# Patient Record
Sex: Female | Born: 1985 | Race: White | Hispanic: No | Marital: Married | Smoking: Never smoker
Health system: Southern US, Community
[De-identification: ages and names within clinical notes are randomized; demographics above are authoritative.]

## PROBLEM LIST (undated history)

## (undated) ENCOUNTER — Inpatient Hospital Stay (HOSPITAL_COMMUNITY): Payer: Self-pay

## (undated) DIAGNOSIS — K831 Obstruction of bile duct: Secondary | ICD-10-CM

## (undated) DIAGNOSIS — F329 Major depressive disorder, single episode, unspecified: Secondary | ICD-10-CM

## (undated) DIAGNOSIS — Z98891 History of uterine scar from previous surgery: Secondary | ICD-10-CM

## (undated) DIAGNOSIS — I519 Heart disease, unspecified: Secondary | ICD-10-CM

## (undated) DIAGNOSIS — K760 Fatty (change of) liver, not elsewhere classified: Secondary | ICD-10-CM

## (undated) DIAGNOSIS — F32A Depression, unspecified: Secondary | ICD-10-CM

## (undated) DIAGNOSIS — E669 Obesity, unspecified: Secondary | ICD-10-CM

## (undated) DIAGNOSIS — F419 Anxiety disorder, unspecified: Secondary | ICD-10-CM

## (undated) DIAGNOSIS — Z8759 Personal history of other complications of pregnancy, childbirth and the puerperium: Secondary | ICD-10-CM

## (undated) DIAGNOSIS — O26619 Liver and biliary tract disorders in pregnancy, unspecified trimester: Secondary | ICD-10-CM

## (undated) DIAGNOSIS — O26649 Intrahepatic cholestasis of pregnancy, unspecified trimester: Secondary | ICD-10-CM

## (undated) DIAGNOSIS — I10 Essential (primary) hypertension: Secondary | ICD-10-CM

## (undated) DIAGNOSIS — E559 Vitamin D deficiency, unspecified: Secondary | ICD-10-CM

## (undated) DIAGNOSIS — Z8619 Personal history of other infectious and parasitic diseases: Secondary | ICD-10-CM

## (undated) DIAGNOSIS — K7581 Nonalcoholic steatohepatitis (NASH): Secondary | ICD-10-CM

## (undated) HISTORY — DX: Depression, unspecified: F32.A

## (undated) HISTORY — PX: WISDOM TOOTH EXTRACTION: SHX21

## (undated) HISTORY — DX: Liver and biliary tract disorders in pregnancy, unspecified trimester: O26.619

## (undated) HISTORY — DX: Anxiety disorder, unspecified: F41.9

## (undated) HISTORY — DX: Intrahepatic cholestasis of pregnancy, unspecified trimester: O26.649

## (undated) HISTORY — DX: Major depressive disorder, single episode, unspecified: F32.9

## (undated) HISTORY — DX: Obstruction of bile duct: K83.1

## (undated) HISTORY — DX: Obesity, unspecified: E66.9

## (undated) HISTORY — DX: Essential (primary) hypertension: I10

## (undated) HISTORY — DX: Vitamin D deficiency, unspecified: E55.9

## (undated) HISTORY — DX: Heart disease, unspecified: I51.9

## (undated) HISTORY — DX: Fatty (change of) liver, not elsewhere classified: K76.0

## (undated) HISTORY — PX: APPENDECTOMY: SHX54

## (undated) HISTORY — DX: Personal history of other infectious and parasitic diseases: Z86.19

---
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
## 2014-04-15 ENCOUNTER — Encounter: Payer: Self-pay | Admitting: Nurse Practitioner

## 2014-04-25 ENCOUNTER — Ambulatory Visit (INDEPENDENT_AMBULATORY_CARE_PROVIDER_SITE_OTHER): Payer: BC Managed Care – PPO | Admitting: Nurse Practitioner

## 2014-04-25 ENCOUNTER — Encounter: Payer: Self-pay | Admitting: Nurse Practitioner

## 2014-04-25 VITALS — BP 124/82 | HR 88 | Temp 98.3°F | Resp 18 | Ht 64.5 in | Wt 312.0 lb

## 2014-04-25 DIAGNOSIS — Z6841 Body Mass Index (BMI) 40.0 and over, adult: Secondary | ICD-10-CM | POA: Insufficient documentation

## 2014-04-25 DIAGNOSIS — Z87898 Personal history of other specified conditions: Secondary | ICD-10-CM | POA: Insufficient documentation

## 2014-04-25 DIAGNOSIS — D229 Melanocytic nevi, unspecified: Secondary | ICD-10-CM | POA: Insufficient documentation

## 2014-04-25 DIAGNOSIS — Z0000 Encounter for general adult medical examination without abnormal findings: Secondary | ICD-10-CM | POA: Insufficient documentation

## 2014-04-25 HISTORY — DX: Personal history of other specified conditions: Z87.898

## 2014-04-25 NOTE — Progress Notes (Signed)
Subjective:     Patricia Berry is a 28 y.o. female and is here to establish care. Historical care was with PCP in [place], [place]. The patient reports problems - struggle with weight & peripheral edema in bilat LE w/ prolonged sitting & walking.  Regarding weight, pt states she is only member of her family with weight problem. Husband is nml wt. She has had thyroid studies in past, always nml. She has been to nutritionist, doesn't feel she has horrible diet. Drinks mostly water & bottled green tea. Cooks at home most nights. Not exercising. She is tearful as she discusses struggles with weight. Regarding peripheral edema, she describes 2 episodes that occurred while traveling on separate occasions that she had to buy bigger shoes because feet so swollen. She was walking a lot. She mentions hands & feet are frequently cold. History   Social History  . Marital Status: Married    Spouse Name: N/A    Number of Children: N/A  . Years of Education: N/A   Occupational History  . Not on file.   Social History Main Topics  . Smoking status: Never Smoker   . Smokeless tobacco: Not on file  . Alcohol Use: Not on file  . Drug Use: Not on file  . Sexual Activity: Not on file   Other Topics Concern  . Not on file   Social History Narrative  . No narrative on file   Health Maintenance  Topic Date Due  . PAP SMEAR  04/01/2005  . TETANUS/TDAP  04/01/2006  . INFLUENZA VACCINE  01/18/2014    The following portions of the patient's history were reviewed and updated as appropriate: allergies, current medications, past family history, past medical history, past social history, past surgical history and problem list.  Review of Systems Pertinent items are noted in HPI.   Objective:    BP 124/82 mmHg  Pulse 88  Temp(Src) 98.3 F (36.8 C) (Oral)  Resp 18  Ht 5' 4.5" (1.638 m)  Wt 312 lb (141.522 kg)  BMI 52.75 kg/m2  SpO2 96%  LMP 04/10/2014 General appearance: alert, cooperative, appears  stated age and no distress Head: Normocephalic, without obvious abnormality, atraumatic Eyes: negative findings: lids and lashes normal, conjunctivae and sclerae normal, corneas clear and pupils equal, round, reactive to light and accomodation Ears: normal TM's and external ear canals both ears Throat: lips, mucosa, and tongue normal; teeth and gums normal Lungs: clear to auscultation bilaterally Heart: regular rate and rhythm, S1, S2 normal, no murmur, click, rub or gallop Abdomen: normal findings: no masses palpable, no organomegaly and spleen non-palpable and abnormal findings:  RUQ tender to deep palpation Extremities: extremities normal, atraumatic, no cyanosis or edema Pulses: 2+ and symmetric Skin: Skin color, texture, turgor normal. No rashes or lesions or nevi - back, brown, multi-colored, round, appears to have light brown halo around it. about 4 mm Neurologic: Alert and oriented X 3, normal strength and tone. Normal symmetric reflexes. Normal coordination and gait    Assessment & Plan:  1. BMI 50.0-59.9, adult 81 Ho for best nutrition & weight loss F/u 6 weeks  2. Nevus R upper back Watch & wait  3. History of peripheral edema Associated w/prolonged walking & sitting Likely r/t weight, pendulous abdomen  4. Preventative health care Declines Tdap & flu today Last pap 1 ya, nml, no abnmls.

## 2014-04-25 NOTE — Patient Instructions (Addendum)
Please refer to handout & follow principles for best nutrition & weight loss.  Develop lifelong habits of exercise most days of the week: take a 30 minute walk. The benefits include weight loss, lower risk for heart disease, diabetes, stroke, high blood pressure, lower rates of depression & dementia, better sleep quality & bone health.  Return for fasting labs-water, tea & coffee without sugar or milk is OK.  My office will call with lab results and any necessary follow up.  I will see you in 6 weeks.  Nice to meet you!  Preventive Care for Adults, Female A healthy lifestyle and preventive care can promote health and wellness. Preventive health guidelines for women include the following key practices.  A routine yearly physical is a good way to check with your caregiver about your health and preventive screening. It is a chance to share any concerns and updates on your health, and to receive a thorough exam.  Visit your dentist for a routine exam and preventive care every 6 months. Brush your teeth twice a day and floss once a day. Good oral hygiene prevents tooth decay and gum disease.  The frequency of eye exams is based on your age, health, family medical history, use of contact lenses, and other factors. Follow your caregiver's recommendations for frequency of eye exams.  Eat a healthy diet. Foods like vegetables, fruits, whole grains, low-fat dairy products, and lean protein foods contain the nutrients you need without too many calories. Decrease your intake of foods high in solid fats, added sugars, and salt. Eat the right amount of calories for you.Get information about a proper diet from your caregiver, if necessary.  Regular physical exercise is one of the most important things you can do for your health. Most adults should get at least 150 minutes of moderate-intensity exercise (any activity that increases your heart rate and causes you to sweat) each week. In addition, most adults  need muscle-strengthening exercises on 2 or more days a week.  Maintain a healthy weight. The body mass index (BMI) is a screening tool to identify possible weight problems. It provides an estimate of body fat based on height and weight. Your caregiver can help determine your BMI, and can help you achieve or maintain a healthy weight.For adults 20 years and older:  A BMI below 18.5 is considered underweight.  A BMI of 18.5 to 24.9 is normal.  A BMI of 25 to 29.9 is considered overweight.  A BMI of 30 and above is considered obese.  Maintain normal blood lipids and cholesterol levels by exercising and minimizing your intake of saturated fat. Eat a balanced diet with plenty of fruit and vegetables. Blood tests for lipids and cholesterol should begin at age 33 and be repeated every 5 years. If your lipid or cholesterol levels are high, you are over 50, or you are at high risk for heart disease, you may need your cholesterol levels checked more frequently.Ongoing high lipid and cholesterol levels should be treated with medicines if diet and exercise are not effective.  If you smoke, find out from your caregiver how to quit. If you do not use tobacco, do not start.  Lung cancer screening is recommended for adults aged 61 80 years who are at high risk for developing lung cancer because of a history of smoking. Yearly low-dose computed tomography (CT) is recommended for people who have at least a 30-pack-year history of smoking and are a current smoker or have quit within the past  15 years. A pack year of smoking is smoking an average of 1 pack of cigarettes a day for 1 year (for example: 1 pack a day for 30 years or 2 packs a day for 15 years). Yearly screening should continue until the smoker has stopped smoking for at least 15 years. Yearly screening should also be stopped for people who develop a health problem that would prevent them from having lung cancer treatment.  If you are pregnant, do not  drink alcohol. If you are breastfeeding, be very cautious about drinking alcohol. If you are not pregnant and choose to drink alcohol, do not exceed 1 drink per day. One drink is considered to be 12 ounces (355 mL) of beer, 5 ounces (148 mL) of wine, or 1.5 ounces (44 mL) of liquor.  Avoid use of street drugs. Do not share needles with anyone. Ask for help if you need support or instructions about stopping the use of drugs.  High blood pressure causes heart disease and increases the risk of stroke. Your blood pressure should be checked at least every 1 to 2 years. Ongoing high blood pressure should be treated with medicines if weight loss and exercise are not effective.  If you are 55 to 27 years old, ask your caregiver if you should take aspirin to prevent strokes.  Diabetes screening involves taking a blood sample to check your fasting blood sugar level. This should be done once every 3 years, after age 45, if you are within normal weight and without risk factors for diabetes. Testing should be considered at a younger age or be carried out more frequently if you are overweight and have at least 1 risk factor for diabetes.  Breast cancer screening is essential preventive care for women. You should practice "breast self-awareness." This means understanding the normal appearance and feel of your breasts and may include breast self-examination. Any changes detected, no matter how small, should be reported to a caregiver. Women in their 20s and 30s should have a clinical breast exam (CBE) by a caregiver as part of a regular health exam every 1 to 3 years. After age 40, women should have a CBE every year. Starting at age 40, women should consider having a mammography (breast X-ray test) every year. Women who have a family history of breast cancer should talk to their caregiver about genetic screening. Women at a high risk of breast cancer should talk to their caregivers about having magnetic resonance imaging  (MRI) and a mammography every year.  Breast cancer gene (BRCA)-related cancer risk assessment is recommended for women who have family members with BRCA-related cancers. BRCA-related cancers include breast, ovarian, tubal, and peritoneal cancers. Having family members with these cancers may be associated with an increased risk for harmful changes (mutations) in the breast cancer genes BRCA1 and BRCA2. Results of the assessment will determine the need for genetic counseling and BRCA1 and BRCA2 testing.  The Pap test is a screening test for cervical cancer. A Pap test can show cell changes on the cervix that might become cervical cancer if left untreated. A Pap test is a procedure in which cells are obtained and examined from the lower end of the uterus (cervix).  Women should have a Pap test starting at age 21.  Between ages 21 and 29, Pap tests should be repeated every 2 years.  Beginning at age 30, you should have a Pap test every 3 years as long as the past 3 Pap tests have been normal.    Some women have medical problems that increase the chance of getting cervical cancer. Talk to your caregiver about these problems. It is especially important to talk to your caregiver if a new problem develops soon after your last Pap test. In these cases, your caregiver may recommend more frequent screening and Pap tests.  The above recommendations are the same for women who have or have not gotten the vaccine for human papillomavirus (HPV).  If you had a hysterectomy for a problem that was not cancer or a condition that could lead to cancer, then you no longer need Pap tests. Even if you no longer need a Pap test, a regular exam is a good idea to make sure no other problems are starting.  If you are between ages 65 and 70, and you have had normal Pap tests going back 10 years, you no longer need Pap tests. Even if you no longer need a Pap test, a regular exam is a good idea to make sure no other problems are  starting.  If you have had past treatment for cervical cancer or a condition that could lead to cancer, you need Pap tests and screening for cancer for at least 20 years after your treatment.  If Pap tests have been discontinued, risk factors (such as a new sexual partner) need to be reassessed to determine if screening should be resumed.  The HPV test is an additional test that may be used for cervical cancer screening. The HPV test looks for the virus that can cause the cell changes on the cervix. The cells collected during the Pap test can be tested for HPV. The HPV test could be used to screen women aged 30 years and older, and should be used in women of any age who have unclear Pap test results. After the age of 30, women should have HPV testing at the same frequency as a Pap test.  Colorectal cancer can be detected and often prevented. Most routine colorectal cancer screening begins at the age of 50 and continues through age 75. However, your caregiver may recommend screening at an earlier age if you have risk factors for colon cancer. On a yearly basis, your caregiver may provide home test kits to check for hidden blood in the stool. Use of a small camera at the end of a tube, to directly examine the colon (sigmoidoscopy or colonoscopy), can detect the earliest forms of colorectal cancer. Talk to your caregiver about this at age 50, when routine screening begins. Direct examination of the colon should be repeated every 5 to 10 years through age 75, unless early forms of pre-cancerous polyps or small growths are found.  Hepatitis C blood testing is recommended for all people born from 1945 through 1965 and any individual with known risks for hepatitis C.  Practice safe sex. Use condoms and avoid high-risk sexual practices to reduce the spread of sexually transmitted infections (STIs). STIs include gonorrhea, chlamydia, syphilis, trichomonas, herpes, HPV, and human immunodeficiency virus (HIV).  Herpes, HIV, and HPV are viral illnesses that have no cure. They can result in disability, cancer, and death. Sexually active women aged 25 and younger should be checked for chlamydia. Older women with new or multiple partners should also be tested for chlamydia. Testing for other STIs is recommended if you are sexually active and at increased risk.  Osteoporosis is a disease in which the bones lose minerals and strength with aging. This can result in serious bone fractures. The risk of osteoporosis   can be identified using a bone density scan. Women ages 34 and over and women at risk for fractures or osteoporosis should discuss screening with their caregivers. Ask your caregiver whether you should take a calcium supplement or vitamin D to reduce the rate of osteoporosis.  Menopause can be associated with physical symptoms and risks. Hormone replacement therapy is available to decrease symptoms and risks. You should talk to your caregiver about whether hormone replacement therapy is right for you.  Use sunscreen. Apply sunscreen liberally and repeatedly throughout the day. You should seek shade when your shadow is shorter than you. Protect yourself by wearing long sleeves, pants, a wide-brimmed hat, and sunglasses year round, whenever you are outdoors.  Once a month, do a whole body skin exam, using a mirror to look at the skin on your back. Notify your caregiver of new moles, moles that have irregular borders, moles that are larger than a pencil eraser, or moles that have changed in shape or color.  Stay current with required immunizations.  Influenza vaccine. All adults should be immunized every year.  Tetanus, diphtheria, and acellular pertussis (Td, Tdap) vaccine. Pregnant women should receive 1 dose of Tdap vaccine during each pregnancy. The dose should be obtained regardless of the length of time since the last dose. Immunization is preferred during the 27th to 36th week of gestation. An adult who  has not previously received Tdap or who does not know her vaccine status should receive 1 dose of Tdap. This initial dose should be followed by tetanus and diphtheria toxoids (Td) booster doses every 10 years. Adults with an unknown or incomplete history of completing a 3-dose immunization series with Td-containing vaccines should begin or complete a primary immunization series including a Tdap dose. Adults should receive a Td booster every 10 years.  Varicella vaccine. An adult without evidence of immunity to varicella should receive 2 doses or a second dose if she has previously received 1 dose. Pregnant females who do not have evidence of immunity should receive the first dose after pregnancy. This first dose should be obtained before leaving the health care facility. The second dose should be obtained 4 8 weeks after the first dose.  Human papillomavirus (HPV) vaccine. Females aged 81 26 years who have not received the vaccine previously should obtain the 3-dose series. The vaccine is not recommended for use in pregnant females. However, pregnancy testing is not needed before receiving a dose. If a female is found to be pregnant after receiving a dose, no treatment is needed. In that case, the remaining doses should be delayed until after the pregnancy. Immunization is recommended for any person with an immunocompromised condition through the age of 47 years if she did not get any or all doses earlier. During the 3-dose series, the second dose should be obtained 4 8 weeks after the first dose. The third dose should be obtained 24 weeks after the first dose and 16 weeks after the second dose.  Zoster vaccine. One dose is recommended for adults aged 29 years or older unless certain conditions are present.  Measles, mumps, and rubella (MMR) vaccine. Adults born before 35 generally are considered immune to measles and mumps. Adults born in 3 or later should have 1 or more doses of MMR vaccine unless  there is a contraindication to the vaccine or there is laboratory evidence of immunity to each of the three diseases. A routine second dose of MMR vaccine should be obtained at least 28 days after  the first dose for students attending postsecondary schools, health care workers, or international travelers. People who received inactivated measles vaccine or an unknown type of measles vaccine during 1963 1967 should receive 2 doses of MMR vaccine. People who received inactivated mumps vaccine or an unknown type of mumps vaccine before 1979 and are at high risk for mumps infection should consider immunization with 2 doses of MMR vaccine. For females of childbearing age, rubella immunity should be determined. If there is no evidence of immunity, females who are not pregnant should be vaccinated. If there is no evidence of immunity, females who are pregnant should delay immunization until after pregnancy. Unvaccinated health care workers born before 1957 who lack laboratory evidence of measles, mumps, or rubella immunity or laboratory confirmation of disease should consider measles and mumps immunization with 2 doses of MMR vaccine or rubella immunization with 1 dose of MMR vaccine.  Pneumococcal 13-valent conjugate (PCV13) vaccine. When indicated, a person who is uncertain of her immunization history and has no record of immunization should receive the PCV13 vaccine. An adult aged 19 years or older who has certain medical conditions and has not been previously immunized should receive 1 dose of PCV13 vaccine. This PCV13 should be followed with a dose of pneumococcal polysaccharide (PPSV23) vaccine. The PPSV23 vaccine dose should be obtained at least 8 weeks after the dose of PCV13 vaccine. An adult aged 19 years or older who has certain medical conditions and previously received 1 or more doses of PPSV23 vaccine should receive 1 dose of PCV13. The PCV13 vaccine dose should be obtained 1 or more years after the last  PPSV23 vaccine dose.  Pneumococcal polysaccharide (PPSV23) vaccine. When PCV13 is also indicated, PCV13 should be obtained first. All adults aged 65 years and older should be immunized. An adult younger than age 65 years who has certain medical conditions should be immunized. Any person who resides in a nursing home or long-term care facility should be immunized. An adult smoker should be immunized. People with an immunocompromised condition and certain other conditions should receive both PCV13 and PPSV23 vaccines. People with human immunodeficiency virus (HIV) infection should be immunized as soon as possible after diagnosis. Immunization during chemotherapy or radiation therapy should be avoided. Routine use of PPSV23 vaccine is not recommended for American Indians, [place] Natives, or people younger than 65 years unless there are medical conditions that require PPSV23 vaccine. When indicated, people who have unknown immunization and have no record of immunization should receive PPSV23 vaccine. One-time revaccination 5 years after the first dose of PPSV23 is recommended for people aged 19 64 years who have chronic kidney failure, nephrotic syndrome, asplenia, or immunocompromised conditions. People who received 1 2 doses of PPSV23 before age 65 years should receive another dose of PPSV23 vaccine at age 65 years or later if at least 5 years have passed since the previous dose. Doses of PPSV23 are not needed for people immunized with PPSV23 at or after age 65 years.  Meningococcal vaccine. Adults with asplenia or persistent complement component deficiencies should receive 2 doses of quadrivalent meningococcal conjugate (MenACWY-D) vaccine. The doses should be obtained at least 2 months apart. Microbiologists working with certain meningococcal bacteria, military recruits, people at risk during an outbreak, and people who travel to or live in countries with a high rate of meningitis should be immunized. A  first-year college student up through age 21 years who is living in a residence hall should receive a dose if she did not   receive a dose on or after her 16th birthday. Adults who have certain high-risk conditions should receive one or more doses of vaccine.  Hepatitis A vaccine. Adults who wish to be protected from this disease, have certain high-risk conditions, work with hepatitis A-infected animals, work in hepatitis A research labs, or travel to or work in countries with a high rate of hepatitis A should be immunized. Adults who were previously unvaccinated and who anticipate close contact with an international adoptee during the first 60 days after arrival in the [place] from a country with a high rate of hepatitis A should be immunized.  Hepatitis B vaccine. Adults who wish to be protected from this disease, have certain high-risk conditions, may be exposed to blood or other infectious body fluids, are household contacts or sex partners of hepatitis B positive people, are clients or workers in certain care facilities, or travel to or work in countries with a high rate of hepatitis B should be immunized.  Haemophilus influenzae type b (Hib) vaccine. A previously unvaccinated person with asplenia or sickle cell disease or having a scheduled splenectomy should receive 1 dose of Hib vaccine. Regardless of previous immunization, a recipient of a hematopoietic stem cell transplant should receive a 3-dose series 6 12 months after her successful transplant. Hib vaccine is not recommended for adults with HIV infection. Preventive Services / Frequency Ages 19 to 39  Blood pressure check.** / Every 1 to 2 years.  Lipid and cholesterol check.** / Every 5 years beginning at age 20.  Clinical breast exam.** / Every 3 years for women in their 20s and 30s.  BRCA-related cancer risk assessment.** / For women who have family members with a BRCA-related cancer (breast, ovarian, tubal, or peritoneal  cancers).  Pap test.** / Every 2 years from ages 21 through 29. Every 3 years starting at age 30 through age 65 or 70 with a history of 3 consecutive normal Pap tests.  HPV screening.** / Every 3 years from ages 30 through ages 65 to 70 with a history of 3 consecutive normal Pap tests.  Hepatitis C blood test.** / For any individual with known risks for hepatitis C.  Skin self-exam. / Monthly.  Influenza vaccine. / Every year.  Tetanus, diphtheria, and acellular pertussis (Tdap, Td) vaccine.** / Consult your caregiver. Pregnant women should receive 1 dose of Tdap vaccine during each pregnancy. 1 dose of Td every 10 years.  Varicella vaccine.** / Consult your caregiver. Pregnant females who do not have evidence of immunity should receive the first dose after pregnancy.  HPV vaccine. / 3 doses over 6 months, if 26 and younger. The vaccine is not recommended for use in pregnant females. However, pregnancy testing is not needed before receiving a dose.  Measles, mumps, rubella (MMR) vaccine.** / You need at least 1 dose of MMR if you were born in 1957 or later. You may also need a 2nd dose. For females of childbearing age, rubella immunity should be determined. If there is no evidence of immunity, females who are not pregnant should be vaccinated. If there is no evidence of immunity, females who are pregnant should delay immunization until after pregnancy.  Pneumococcal 13-valent conjugate (PCV13) vaccine.** / Consult your caregiver.  Pneumococcal polysaccharide (PPSV23) vaccine.** / 1 to 2 doses if you smoke cigarettes or if you have certain conditions.  Meningococcal vaccine.** / 1 dose if you are age 19 to 21 years and a first-year college student living in a residence hall, or have   one of several medical conditions, you need to get vaccinated against meningococcal disease. You may also need additional booster doses.  Hepatitis A vaccine.** / Consult your caregiver.  Hepatitis B vaccine.**  / Consult your caregiver.  Haemophilus influenzae type b (Hib) vaccine.** / Consult your caregiver. Ages 40 to 64  Blood pressure check.** / Every 1 to 2 years.  Lipid and cholesterol check.** / Every 5 years beginning at age 20.  Lung cancer screening. / Every year if you are aged 55 80 years and have a 30-pack-year history of smoking and currently smoke or have quit within the past 15 years. Yearly screening is stopped once you have quit smoking for at least 15 years or develop a health problem that would prevent you from having lung cancer treatment.  Clinical breast exam.** / Every year after age 40.  BRCA-related cancer risk assessment.** / For women who have family members with a BRCA-related cancer (breast, ovarian, tubal, or peritoneal cancers).  Mammogram.** / Every year beginning at age 40 and continuing for as long as you are in good health. Consult with your caregiver.  Pap test.** / Every 3 years starting at age 30 through age 65 or 70 with a history of 3 consecutive normal Pap tests.  HPV screening.** / Every 3 years from ages 30 through ages 65 to 70 with a history of 3 consecutive normal Pap tests.  Fecal occult blood test (FOBT) of stool. / Every year beginning at age 50 and continuing until age 75. You may not need to do this test if you get a colonoscopy every 10 years.  Flexible sigmoidoscopy or colonoscopy.** / Every 5 years for a flexible sigmoidoscopy or every 10 years for a colonoscopy beginning at age 50 and continuing until age 75.  Hepatitis C blood test.** / For all people born from 1945 through 1965 and any individual with known risks for hepatitis C.  Skin self-exam. / Monthly.  Influenza vaccine. / Every year.  Tetanus, diphtheria, and acellular pertussis (Tdap/Td) vaccine.** / Consult your caregiver. Pregnant women should receive 1 dose of Tdap vaccine during each pregnancy. 1 dose of Td every 10 years.  Varicella vaccine.** / Consult your caregiver.  Pregnant females who do not have evidence of immunity should receive the first dose after pregnancy.  Zoster vaccine.** / 1 dose for adults aged 60 years or older.  Measles, mumps, rubella (MMR) vaccine.** / You need at least 1 dose of MMR if you were born in 1957 or later. You may also need a 2nd dose. For females of childbearing age, rubella immunity should be determined. If there is no evidence of immunity, females who are not pregnant should be vaccinated. If there is no evidence of immunity, females who are pregnant should delay immunization until after pregnancy.  Pneumococcal 13-valent conjugate (PCV13) vaccine.** / Consult your caregiver.  Pneumococcal polysaccharide (PPSV23) vaccine.** / 1 to 2 doses if you smoke cigarettes or if you have certain conditions.  Meningococcal vaccine.** / Consult your caregiver.  Hepatitis A vaccine.** / Consult your caregiver.  Hepatitis B vaccine.** / Consult your caregiver.  Haemophilus influenzae type b (Hib) vaccine.** / Consult your caregiver. Ages 65 and over  Blood pressure check.** / Every 1 to 2 years.  Lipid and cholesterol check.** / Every 5 years beginning at age 20.  Lung cancer screening. / Every year if you are aged 55 80 years and have a 30-pack-year history of smoking and currently smoke or have quit within the past   15 years. Yearly screening is stopped once you have quit smoking for at least 15 years or develop a health problem that would prevent you from having lung cancer treatment.  Clinical breast exam.** / Every year after age 19.  BRCA-related cancer risk assessment.** / For women who have family members with a BRCA-related cancer (breast, ovarian, tubal, or peritoneal cancers).  Mammogram.** / Every year beginning at age 22 and continuing for as long as you are in good health. Consult with your caregiver.  Pap test.** / Every 3 years starting at age 76 through age 3 or 47 with a 3 consecutive normal Pap tests. Testing  can be stopped between 65 and 70 with 3 consecutive normal Pap tests and no abnormal Pap or HPV tests in the past 10 years.  HPV screening.** / Every 3 years from ages 2 through ages 44 or 43 with a history of 3 consecutive normal Pap tests. Testing can be stopped between 65 and 70 with 3 consecutive normal Pap tests and no abnormal Pap or HPV tests in the past 10 years.  Fecal occult blood test (FOBT) of stool. / Every year beginning at age 13 and continuing until age 9. You may not need to do this test if you get a colonoscopy every 10 years.  Flexible sigmoidoscopy or colonoscopy.** / Every 5 years for a flexible sigmoidoscopy or every 10 years for a colonoscopy beginning at age 53 and continuing until age 41.  Hepatitis C blood test.** / For all people born from [birth year] through 1964 and any individual with known risks for hepatitis C.  Osteoporosis screening.** / A one-time screening for women ages 54 and over and women at risk for fractures or osteoporosis.  Skin self-exam. / Monthly.  Influenza vaccine. / Every year.  Tetanus, diphtheria, and acellular pertussis (Tdap/Td) vaccine.** / 1 dose of Td every 10 years.  Varicella vaccine.** / Consult your caregiver.  Zoster vaccine.** / 1 dose for adults aged 58 years or older.  Pneumococcal 13-valent conjugate (PCV13) vaccine.** / Consult your caregiver.  Pneumococcal polysaccharide (PPSV23) vaccine.** / 1 dose for all adults aged 64 years and older.  Meningococcal vaccine.** / Consult your caregiver.  Hepatitis A vaccine.** / Consult your caregiver.  Hepatitis B vaccine.** / Consult your caregiver.  Haemophilus influenzae type b (Hib) vaccine.** / Consult your caregiver. ** Family history and personal history of risk and conditions may change your caregiver's recommendations. Document Released: 08/02/2001 Document Revised: 10/01/2012 Document Reviewed: 11/01/2010 [facility] Patient Information 2014 [place], [place].

## 2014-04-25 NOTE — Progress Notes (Signed)
Pre visit review using our clinic review tool, if applicable. No additional management support is needed unless otherwise documented below in the visit note. 

## 2014-04-30 ENCOUNTER — Other Ambulatory Visit: Payer: Self-pay | Admitting: Nurse Practitioner

## 2014-04-30 ENCOUNTER — Telehealth: Payer: Self-pay

## 2014-04-30 ENCOUNTER — Other Ambulatory Visit (INDEPENDENT_AMBULATORY_CARE_PROVIDER_SITE_OTHER): Payer: BC Managed Care – PPO

## 2014-04-30 DIAGNOSIS — R748 Abnormal levels of other serum enzymes: Secondary | ICD-10-CM

## 2014-04-30 DIAGNOSIS — Z0000 Encounter for general adult medical examination without abnormal findings: Secondary | ICD-10-CM

## 2014-04-30 LAB — COMPREHENSIVE METABOLIC PANEL
ALT: 57 U/L — ABNORMAL HIGH (ref 0–35)
AST: 44 U/L — ABNORMAL HIGH (ref 0–37)
Albumin: 3.3 g/dL — ABNORMAL LOW (ref 3.5–5.2)
Alkaline Phosphatase: 72 U/L (ref 39–117)
BUN: 10 mg/dL (ref 6–23)
CO2: 21 meq/L (ref 19–32)
CREATININE: 0.7 mg/dL (ref 0.4–1.2)
Calcium: 9.1 mg/dL (ref 8.4–10.5)
Chloride: 106 mEq/L (ref 96–112)
GFR: 101.58 mL/min (ref >60.00)
GLUCOSE: 76 mg/dL (ref 70–99)
Potassium: 4.3 mEq/L (ref 3.5–5.1)
Sodium: 139 mEq/L (ref 135–145)
TOTAL PROTEIN: 6.6 g/dL (ref 6.0–8.3)
Total Bilirubin: 0.7 mg/dL (ref 0.2–1.2)

## 2014-04-30 LAB — CBC WITH DIFFERENTIAL/PLATELET
BASOS PCT: 0.5 % (ref 0.0–3.0)
Basophils Absolute: 0 10*3/uL (ref 0.0–0.1)
Eosinophils Absolute: 0.2 10*3/uL (ref 0.0–0.7)
Eosinophils Relative: 2.5 % (ref 0.0–5.0)
HCT: 40.2 % (ref 36.0–46.0)
Hemoglobin: 13.3 g/dL (ref 12.0–15.0)
Lymphocytes Relative: 29.2 % (ref 12.0–46.0)
Lymphs Abs: 2.3 10*3/uL (ref 0.7–4.0)
MCHC: 33 g/dL (ref 30.0–36.0)
MCV: 82 fl (ref 78.0–100.0)
MONO ABS: 0.5 10*3/uL (ref 0.1–1.0)
Monocytes Relative: 6.2 % (ref 3.0–12.0)
NEUTROS ABS: 5 10*3/uL (ref 1.4–7.7)
Neutrophils Relative %: 61.6 % (ref 43.0–77.0)
Platelets: 326 10*3/uL (ref 150.0–400.0)
RBC: 4.91 Mil/uL (ref 3.87–5.11)
RDW: 14.4 % (ref 11.5–15.5)
WBC: 8 10*3/uL (ref 4.0–10.5)

## 2014-04-30 LAB — LIPID PANEL
Cholesterol: 172 mg/dL (ref 0–200)
HDL: 36.4 mg/dL — ABNORMAL LOW (ref >39.00)
LDL Cholesterol: 121 mg/dL — ABNORMAL HIGH (ref 0–99)
NONHDL: 135.6
Total CHOL/HDL Ratio: 5
Triglycerides: 75 mg/dL (ref 0.0–149.0)
VLDL: 15 mg/dL (ref 0.0–40.0)

## 2014-04-30 LAB — HEMOGLOBIN A1C: HEMOGLOBIN A1C: 5.3 % (ref 4.6–6.5)

## 2014-04-30 NOTE — Telephone Encounter (Signed)
Pt came in for blood work today. Can I have orders for pt?

## 2014-05-01 LAB — VITAMIN D 25 HYDROXY (VIT D DEFICIENCY, FRACTURES): VITD: 14.57 ng/mL — ABNORMAL LOW (ref 30.00–100.00)

## 2014-05-01 LAB — T4, FREE: Free T4: 0.83 ng/dL (ref 0.60–1.60)

## 2014-05-01 LAB — TSH: TSH: 2.85 u[IU]/mL (ref 0.35–4.50)

## 2014-05-01 NOTE — Telephone Encounter (Signed)
pls call pt: Advise Lab results: liver enzymes slightly high-sometimes see this after viral infection, will check again in 4 weeks. Avoid tylenol/acetaminophen products & alcoholic beverages.  Vitamin D is very low-start D3 5000 iu daily with meal. Will check again in 3 mos. Cardio-protective cholesterol is too low-exercise will increase this-30 minutes daily. Blood count, diabetes screen & thyroid studies look good.  I will address any questions at f/u in 4 weeks. Pls make sure she has f/u appt in 4 wks.

## 2014-05-02 NOTE — Telephone Encounter (Signed)
Left message for pt to call back  °

## 2014-05-07 NOTE — Telephone Encounter (Signed)
Left message for pt to call back  °

## 2014-06-11 ENCOUNTER — Encounter: Payer: Self-pay | Admitting: Nurse Practitioner

## 2014-06-11 ENCOUNTER — Ambulatory Visit (INDEPENDENT_AMBULATORY_CARE_PROVIDER_SITE_OTHER): Payer: BC Managed Care – PPO | Admitting: Nurse Practitioner

## 2014-06-11 VITALS — BP 116/80 | HR 90 | Temp 98.1°F | Ht 64.5 in | Wt 309.0 lb

## 2014-06-11 DIAGNOSIS — Z6841 Body Mass Index (BMI) 40.0 and over, adult: Secondary | ICD-10-CM | POA: Diagnosis not present

## 2014-06-11 DIAGNOSIS — E669 Obesity, unspecified: Secondary | ICD-10-CM

## 2014-06-11 MED ORDER — PHENTERMINE HCL 15 MG PO CAPS: 15.0000 mg | ORAL_CAPSULE | ORAL | Status: DC

## 2014-06-11 NOTE — Patient Instructions (Signed)
Great job with weight loss!  Start phentermine. Take in morning.  Call office if not losing weight or do not feel appetite is suppressed & I will increase dose to twice daily.  Continue with diet changes. Use them as your guide for most meals. Use portion control.  Continue daily walks.  Please return for Tb  Test placement & BRING vaccine record with you.  Great to see you!  Safe travels & [place]!

## 2014-06-11 NOTE — Progress Notes (Signed)
Pre visit review using our clinic review tool, if applicable. No additional management support is needed unless otherwise documented below in the visit note. 

## 2014-06-14 NOTE — Assessment & Plan Note (Signed)
Followed recommended diet for several weeks: lost 10 pounds, gained 6 back Diet was difficult for her to follow-hard to decrease meat consumption-did not feel full. Never tried meds, is interested. Start phentermine, low dose. Will increase to twice daily if no loss in 2-3 weeks. Discussed potential SE & red flags-heart palps, dizzy, lightheaded.

## 2014-06-14 NOTE — Progress Notes (Signed)
Subjective:     Patricia Berry is a 28 y.o. female here for f/u for weight loss management. Reviewed labs: thyroid disease & DM ruled out. She tried recommended diet for 3 1/2 weeks & lost 10 pounds. She found diet was difficult to follow-decreased meat consumption was hard & didn't feel "full". SHe gained 6 pounds when she went back to normal eating habits. She has increased exercise. Her husband is supportive-he lost 12 pounds on diet. SHe is teary as she discusses efforts.  She is good candidate for wt loss meds. She is interested & understands this is temporary means & will be continued only if she loses weight. She wants to start after holidays. Encouraged her to use principles to guide most meals: no refined sugar, whole grains, most of plate should be covered in vegetables & fruit. She has 1 page form to be filled out for employer.   The following portions of the patient's history were reviewed and updated as appropriate: allergies, current medications, past medical history, past social history, past surgical history and problem list.  Review of Systems Constitutional: negative for fatigue Respiratory: negative for cough Cardiovascular: negative for exertional chest pressure/discomfort, irregular heart beat and lower extremity edema Gastrointestinal: negative for change in bowel habits, constipation, diarrhea and reflux symptoms Behavioral/Psych: negative for excessive alcohol consumption    Objective:    Body mass index is 52.24 kg/(m^2). BP 116/80 mmHg  Pulse 90  Temp(Src) 98.1 F (36.7 C) (Temporal)  Ht 5' 4.5" (1.638 m)  Wt 309 lb (140.161 kg)  BMI 52.24 kg/m2  SpO2 98% General appearance: alert, cooperative, appears stated age and no distress Head: Normocephalic, without obvious abnormality, atraumatic Eyes: negative findings: lids and lashes normal and conjunctivae and sclerae normal Lungs: clear to auscultation bilaterally Heart: regular rate and rhythm, S1, S2 normal,  no murmur, click, rub or gallop    Assessment:    Obesity. I assessed Patricia Berry to be in an action stage with respect to weight loss.     Need Tb test & vaccine records to complete employer form Plan:     Diet interventions: mostly vegetables/fruit/whole grains, limit meat to 16 oz/week. Regular aerobic exercise program discussed. Medication: phentermine. Follow up in: 12 weeks and as needed.  Employer form will be returned when she completes TB skin test.

## 2014-06-17 ENCOUNTER — Ambulatory Visit: Payer: BC Managed Care – PPO

## 2014-06-17 DIAGNOSIS — Z111 Encounter for screening for respiratory tuberculosis: Secondary | ICD-10-CM

## 2014-06-19 LAB — TB SKIN TEST
Induration: 0 mm
TB SKIN TEST: NEGATIVE

## 2014-08-04 ENCOUNTER — Other Ambulatory Visit: Payer: Self-pay | Admitting: Nurse Practitioner

## 2014-08-04 ENCOUNTER — Telehealth: Payer: Self-pay | Admitting: Nurse Practitioner

## 2014-08-04 DIAGNOSIS — Z6841 Body Mass Index (BMI) 40.0 and over, adult: Secondary | ICD-10-CM

## 2014-08-04 MED ORDER — PHENTERMINE HCL 30 MG PO CAPS: 30.0000 mg | ORAL_CAPSULE | ORAL | Status: DC

## 2014-08-04 NOTE — Progress Notes (Signed)
Script for phentermine has been faxed to [facility] in [place]

## 2014-08-04 NOTE — Telephone Encounter (Signed)
Patient doesn't feel any different on the current phentermine dosage. Patricia Berry had mentioned during OV if she didn't feel different she would change the dosage. Patient needs a refill sent to [facility].

## 2014-08-05 NOTE — Progress Notes (Signed)
LMOVM for pt to return call 

## 2014-08-12 NOTE — Telephone Encounter (Signed)
See other phone note from 08/04/14. Med dose has already been raised. Patient needs to schedule 4 week f/u appt to reevaluate. LMOVM for pt to return call.

## 2014-08-12 NOTE — Progress Notes (Signed)
LMOVM for pt to return call 

## 2014-09-12 ENCOUNTER — Ambulatory Visit: Payer: BC Managed Care – PPO | Admitting: Nurse Practitioner

## 2014-10-10 ENCOUNTER — Ambulatory Visit: Payer: BC Managed Care – PPO | Admitting: Nurse Practitioner

## 2014-10-13 ENCOUNTER — Ambulatory Visit (INDEPENDENT_AMBULATORY_CARE_PROVIDER_SITE_OTHER): Payer: BC Managed Care – PPO | Admitting: Nurse Practitioner

## 2014-10-13 ENCOUNTER — Telehealth: Payer: Self-pay | Admitting: Nurse Practitioner

## 2014-10-13 ENCOUNTER — Encounter: Payer: Self-pay | Admitting: Nurse Practitioner

## 2014-10-13 VITALS — BP 122/85 | HR 80 | Temp 97.9°F | Ht 64.5 in | Wt 286.0 lb

## 2014-10-13 DIAGNOSIS — E559 Vitamin D deficiency, unspecified: Secondary | ICD-10-CM | POA: Diagnosis not present

## 2014-10-13 DIAGNOSIS — R748 Abnormal levels of other serum enzymes: Secondary | ICD-10-CM

## 2014-10-13 DIAGNOSIS — Z6841 Body Mass Index (BMI) 40.0 and over, adult: Secondary | ICD-10-CM | POA: Diagnosis not present

## 2014-10-13 DIAGNOSIS — E669 Obesity, unspecified: Secondary | ICD-10-CM

## 2014-10-13 LAB — VITAMIN D 25 HYDROXY (VIT D DEFICIENCY, FRACTURES): VITD: 19.82 ng/mL — AB (ref 30.00–100.00)

## 2014-10-13 LAB — T4, FREE: Free T4: 0.72 ng/dL (ref 0.60–1.60)

## 2014-10-13 LAB — TSH: TSH: 3.4 u[IU]/mL (ref 0.35–4.50)

## 2014-10-13 MED ORDER — PHENTERMINE HCL 30 MG PO CAPS: 30.0000 mg | ORAL_CAPSULE | ORAL | Status: DC

## 2014-10-13 MED ORDER — VITAMIN D3 1.25 MG (50000 UT) PO CAPS: 1.0000 | ORAL_CAPSULE | ORAL | Status: DC

## 2014-10-13 NOTE — Progress Notes (Signed)
Pre visit review using our clinic review tool, if applicable. No additional management support is needed unless otherwise documented below in the visit note. 

## 2014-10-13 NOTE — Telephone Encounter (Signed)
pls call pt: Advise Vit d still too low-it has only moved 4 points. I will send in script.  Thyroid is still in low nml range. Vit d can affect thyroid. So I will check it again when she comes in June.  pls add CMET  Next OV-TSH, T4, A1c, lipids

## 2014-10-13 NOTE — Progress Notes (Signed)
Subjective:     Patricia Berry is a 29 y.o. female here for follow up discussion regarding weight loss management. She has lost 23 pounds in 4 months-about 1 lb per week. She was using phentermine 30 mg daily until 3 weeks ago. She does not eat as frequently when taking it. She denies SE. She ran out of med 3 weeks ago.  She is trying to consume about 1000 cals daily. She is exercising 4 times weekly. She has cut back sugar & is eating more fruits & vegetables. She uses protein shake w/fresh fruit in mornings & salad w/egg or nuts at lunch, & often eats a bean dish in evenings. She has been frustrated with lack of wt loss in last 3 weeks.  We discussed increasing exercise, green tea, re-starting phentermine. Last thyroid studies were nml. She should be able to conume 1800 -2200 cals daily if exercising 3-5 days week & lose 2 lbs/week.  I will check thyroid again today. Vit d def. She is taking gummies. I will check level today & prescribe D3 if still low.  The following portions of the patient's history were reviewed and updated as appropriate: allergies, current medications, past medical history, past social history, past surgical history and problem list.  Review of Systems Pertinent items are noted in HPI.    Objective:    Body mass index is 48.35 kg/(m^2). BP 122/85 mmHg  Pulse 80  Temp(Src) 97.9 F (36.6 C) (Oral)  Ht 5' 4.5" (1.638 m)  Wt 286 lb (129.729 kg)  BMI 48.35 kg/m2  SpO2 99%  LMP 10/10/2014 General appearance: alert, cooperative, appears stated age and no distress Head: Normocephalic, without obvious abnormality, atraumatic Eyes: negative findings: lids and lashes normal and conjunctivae and sclerae normal Lungs: clear to auscultation bilaterally Heart: regular rate and rhythm, S1, S2 normal, no murmur, click, rub or gallop Neurologic: Grossly normal    Assessment:Plan   1. BMI 40.0-44.9, adult Re-start  - phentermine 30 MG capsule; Take 1 capsule (30 mg total) by  mouth every morning.  Dispense: 30 capsule; Refill: 1 - T4, free - TSH - Thyroid peroxidase antibody Continue diet changes INcrease exercise to 5 times week Drink 3 cups green tea daily.  2. Vitamin D deficiency - Vit D  25 hydroxy (rtn osteoporosis monitoring)  F/u 2 mos-wt management, lipids

## 2014-10-13 NOTE — Patient Instructions (Signed)
Increase exercise to 5 days week, minimum. 30 minutes of Walking is adequate if you are walking fast enough to get winded, but still be able to speak in 4-5 word sentence.  Re-start phentermine. Brew green tea & drink at least 3 cups daily.  Continue to cut out refined sugar- anything that is sweet when you eat or drink it except fresh fruit.  Cut out refined grains- bread, rolls, biscuits, bagels, muffins, pasta and cereals that have less than 4 grams fiber per serving.  Limit animal fats & proteins (meaet, eggs, cheese) to 3 to 4 times/week.  Please return in 8 weeks.  I am confident you will be 16 pounds lighter!!!  Nice to see you!

## 2014-10-14 ENCOUNTER — Other Ambulatory Visit (INDEPENDENT_AMBULATORY_CARE_PROVIDER_SITE_OTHER): Payer: BC Managed Care – PPO

## 2014-10-14 DIAGNOSIS — R748 Abnormal levels of other serum enzymes: Secondary | ICD-10-CM | POA: Diagnosis not present

## 2014-10-14 LAB — THYROID PEROXIDASE ANTIBODY

## 2014-10-14 LAB — COMPREHENSIVE METABOLIC PANEL
ALK PHOS: 63 U/L (ref 39–117)
ALT: 29 U/L (ref 0–35)
AST: 20 U/L (ref 0–37)
Albumin: 3.8 g/dL (ref 3.5–5.2)
BUN: 12 mg/dL (ref 6–23)
CALCIUM: 9 mg/dL (ref 8.4–10.5)
CO2: 27 meq/L (ref 19–32)
CREATININE: 0.72 mg/dL (ref 0.40–1.20)
Chloride: 106 mEq/L (ref 96–112)
GFR: 102.86 mL/min (ref >60.00)
Glucose, Bld: 83 mg/dL (ref 70–99)
POTASSIUM: 4.3 meq/L (ref 3.5–5.1)
SODIUM: 140 meq/L (ref 135–145)
Total Bilirubin: 0.4 mg/dL (ref 0.2–1.2)
Total Protein: 6 g/dL (ref 6.0–8.3)

## 2014-10-14 NOTE — Telephone Encounter (Signed)
LMOVM about lab results. Patient to call back with any questions or concerns. DPR Signed.

## 2014-11-19 ENCOUNTER — Other Ambulatory Visit: Payer: Self-pay | Admitting: Nurse Practitioner

## 2014-12-03 ENCOUNTER — Ambulatory Visit: Payer: BC Managed Care – PPO | Admitting: Nurse Practitioner

## 2016-07-29 ENCOUNTER — Telehealth: Payer: Self-pay | Admitting: Nurse Practitioner

## 2016-07-29 NOTE — Telephone Encounter (Signed)
[facility] - Client [facility]  Patient Name: Patricia Berry  DOB: June 10, 1986    Initial Comment Caller states she is having chest tightness, transfer from scheduling for triage   Nurse Assessment  Nurse: Wynetta Emery, RN, Baker Janus Date/Time Eilene Ghazi Time): 07/29/2016 10:43:22 AM  Confirm and document reason for call. If symptomatic, describe symptoms. ---Haliegh has chest pain --that comes and goes -- last night it was more intense and sitting on chest and tight-- onset about an hour last night was most intense  Does the patient have any new or worsening symptoms? ---Yes  Will a triage be completed? ---Yes  Related visit to physician within the last 2 weeks? ---No  Does the PT have any chronic conditions? (i.e. diabetes, asthma, etc.) ---No  Is the patient pregnant or possibly pregnant? (Ask all females between the ages of 27-55) ---No  Is this a behavioral health or substance abuse call? ---No     Guidelines    Guideline Title Affirmed Question Affirmed Notes  Chest Pain [1] Chest pain lasts > 5 minutes AND [2] occurred > 3 days ago (72 hours) AND [3] NO chest pain or cardiac symptoms now    Final Disposition User   See Physician within 24 Hours Murray, RN, Baker Janus    Comments  Nurse advised she needs to be seen within 24 hours; no available appt today -- since these are random attacks of chest pain-- feels she can wait until Monday; Nurse advised of outcome and advised if she wants to wait till then she can but if she gets worse must call back or go to ED appt made Monday with Dr. Raoul Pitch at 930am 08/01/2016   Referrals  [place] UNDECIDED  REFERRED TO PCP OFFICE   Disagree/Comply: Leta Baptist

## 2016-08-01 ENCOUNTER — Ambulatory Visit (INDEPENDENT_AMBULATORY_CARE_PROVIDER_SITE_OTHER): Payer: BC Managed Care – PPO | Admitting: Family Medicine

## 2016-08-01 ENCOUNTER — Telehealth: Payer: Self-pay | Admitting: Family Medicine

## 2016-08-01 ENCOUNTER — Encounter: Payer: Self-pay | Admitting: Family Medicine

## 2016-08-01 VITALS — BP 149/91 | HR 85 | Temp 98.1°F | Resp 20 | Ht 65.0 in | Wt 319.5 lb

## 2016-08-01 DIAGNOSIS — Z6841 Body Mass Index (BMI) 40.0 and over, adult: Secondary | ICD-10-CM | POA: Diagnosis not present

## 2016-08-01 DIAGNOSIS — R079 Chest pain, unspecified: Secondary | ICD-10-CM | POA: Diagnosis not present

## 2016-08-01 DIAGNOSIS — Z8249 Family history of ischemic heart disease and other diseases of the circulatory system: Secondary | ICD-10-CM | POA: Diagnosis not present

## 2016-08-01 LAB — CBC WITH DIFFERENTIAL/PLATELET
BASOS ABS: 0.1 10*3/uL (ref 0.0–0.1)
Basophils Relative: 1.1 % (ref 0.0–3.0)
Eosinophils Absolute: 0.3 10*3/uL (ref 0.0–0.7)
Eosinophils Relative: 3.2 % (ref 0.0–5.0)
HCT: 38.9 % (ref 36.0–46.0)
Hemoglobin: 12.8 g/dL (ref 12.0–15.0)
LYMPHS ABS: 2.9 10*3/uL (ref 0.7–4.0)
Lymphocytes Relative: 33.5 % (ref 12.0–46.0)
MCHC: 32.9 g/dL (ref 30.0–36.0)
MCV: 80.3 fl (ref 78.0–100.0)
MONO ABS: 0.7 10*3/uL (ref 0.1–1.0)
Monocytes Relative: 8.2 % (ref 3.0–12.0)
NEUTROS ABS: 4.7 10*3/uL (ref 1.4–7.7)
Neutrophils Relative %: 54 % (ref 43.0–77.0)
PLATELETS: 328 10*3/uL (ref 150.0–400.0)
RBC: 4.85 Mil/uL (ref 3.87–5.11)
RDW: 15 % (ref 11.5–15.5)
WBC: 8.7 10*3/uL (ref 4.0–10.5)

## 2016-08-01 LAB — TSH: TSH: 3.79 u[IU]/mL (ref 0.35–4.50)

## 2016-08-01 MED ORDER — OMEPRAZOLE 20 MG PO CPDR: 20.0000 mg | DELAYED_RELEASE_CAPSULE | Freq: Every day | ORAL | 2 refills | Status: DC

## 2016-08-01 NOTE — Patient Instructions (Signed)
Start omeprazole daily. If symptoms occur again go start to ED.  We will call you with lab results after they result.  please have fasting labs collected within 1 week.   You will hear from cardiology to schedule.     Angina Pectoris Angina pectoris is a very bad feeling in the chest, neck, or arm. Your doctor may call it angina. There are four types of angina. Angina is caused by a lack of blood in the middle and thickest layer of the heart wall (myocardium). Angina may feel like a crushing or squeezing pain in the chest. It may feel like tightness or heavy pressure in the chest. Some people say it feels like gas, heartburn, or indigestion. Some people have symptoms other than pain. These include:  Shortness of breath.  Cold sweats.  Feeling sick to your stomach (nausea).  Feeling light-headed. Many women have chest discomfort and some of the other symptoms. However, women often have different symptoms, such as:  Feeling tired (fatigue).  Feeling nervous for no reason.  Feeling weak for no reason.  Dizziness or fainting. Women may have angina without any symptoms. Follow these instructions at home:  Take medicines only as told by your doctor.  Take care of other health issues as told by your doctor. These include:  High blood pressure (hypertension).  Diabetes.  Follow a heart-healthy diet. Your doctor can help you to choose healthy food options and make changes.  Talk to your doctor to learn more about healthy cooking methods and use them. These include:  Roasting.  Grilling.  Broiling.  Baking.  Poaching.  Steaming.  Stir-frying.  Follow an exercise program approved by your doctor.  Keep a healthy weight. Lose weight as told by your doctor.  Rest when you are tired.  Learn to manage stress.  Do not use any tobacco, such as cigarettes, chewing tobacco, or electronic cigarettes. If you need help quitting, ask your doctor.  If you drink alcohol, and  your doctor says it is okay, limit yourself to no more than 1 drink per day. One drink equals 12 ounces of beer, 5 ounces of wine, or 1 ounces of hard liquor.  Stop illegal drug use.  Keep all follow-up visits as told by your doctor. This is important. Do not take these medicines unless your doctor says that you can:  Nonsteroidal anti-inflammatory drugs (NSAIDs). These include:  Ibuprofen.  Naproxen.  Celecoxib.  Vitamin supplements that have vitamin A, vitamin E, or both.  Hormone therapy that contains estrogen with or without progestin. Get help right away if:  You have pain in your chest, neck, arm, jaw, stomach, or back that:  Lasts more than a few minutes.  Comes back.  Does not get better after you take medicine under your tongue (sublingual nitroglycerin).  You have any of these symptoms for no reason:  Gas, heartburn, or indigestion.  Sweating a lot.  Shortness of breath or trouble breathing.  Feeling sick to your stomach or throwing up.  Feeling more tired than usual.  Feeling nervous or worrying more than usual.  Feeling weak.  Diarrhea.  You are suddenly dizzy or light-headed.  You faint or pass out. These symptoms may be an emergency. Do not wait to see if the symptoms will go away. Get medical help right away. Call your local emergency services (911 in the [place] Do not drive yourself to the hospital.  This information is not intended to replace advice given to you by your health  care provider. Make sure you discuss any questions you have with your health care provider. Document Released: 11/23/2007 Document Revised: 11/12/2015 Document Reviewed: 10/08/2013 Elsevier Interactive Patient Education  2017 [employer].

## 2016-08-01 NOTE — Telephone Encounter (Signed)
Please call pt: - her labs are normal. No signs of anemia or thyroid disorder

## 2016-08-01 NOTE — Progress Notes (Signed)
Patricia Berry , Jul 26, 1985, 31 y.o., female MRN: [id number] Patient Care Team    Relationship Specialty Notifications Start End  Ma Hillock, DO PCP - General Family Medicine  08/01/16     CC: chest discomfort Subjective   Chest discomfort: Pt presents with a h/o of two separate occassions of chest pain. In [place] she experienced chest pain, which she describes as something sitting on her chest for about an 1 hour. She denies meal within 4 hours prior. She denies radiation of pain, diaphoresis, nausea or SOB associated with pain. She laid down and fell asleep and pain resolved. She again experienced chest pain last week which describes as painful and hard to breath. She states it again lasted about 1 hour, and self resolved. She endorses feeling flushed with pain as well, but feels this may have been secondary to panic. She endorses mild peripheral edema over the last few months. She endorses occasional heartburn, usually when eating pizza only. She is able to walk up steps and perform routine housework without chest pain. She is morbidly obese. She has a family history of heart disease and MI in her MGM at age 29.   No Known Allergies Social History  Substance Use Topics  . Smoking status: Never Smoker  . Smokeless tobacco: Never Used  . Alcohol use 1.2 oz/week    2 Glasses of wine per week   History reviewed. No pertinent past medical history. Past Surgical History:  Procedure Laterality Date  . NO PAST SURGERIES     Family History  Problem Relation Age of Onset  . Hyperlipidemia Mother   . Mental illness Father   . Cancer Maternal Aunt     small cell  . Heart disease Maternal Grandmother     heart attack  . Early death Maternal Grandmother   . Prostate cancer Maternal Grandfather   . Mental illness Paternal Grandmother    Allergies as of 08/01/2016   No Known Allergies     Medication List    as of 08/01/2016  9:54 AM   You have not been prescribed any  medications.     No results found for this or any previous visit (from the past 24 hour(s)). No results found.   ROS: Negative, with the exception of above mentioned in HPI   Objective:  BP (!) 149/91 (BP Location: Right Arm, Patient Position: Sitting, Cuff Size: Large)   Pulse 85   Temp 98.1 F (36.7 C)   Resp 20   Ht 5' 5"  (1.651 m)   Wt (!) 319 lb 8 oz (144.9 kg)   LMP 07/09/2016   SpO2 98%   BMI 53.17 kg/m  Body mass index is 53.17 kg/m. Gen: Afebrile. No acute distress. Nontoxic in appearance, well developed, well nourished. Obese.  HENT: AT. Agenda.MMM Eyes:Pupils Equal Round Reactive to light, Extraocular movements intact,  Conjunctiva without redness, discharge or icterus. Neck/lymp/endocrine: Supple,no lymphadenopathy, no thyromegaly.  CV: RRR no murmur, no edema Chest: CTAB, no wheeze or crackles. Good air movement, normal resp effort.  Abd: Soft. Morbidly obese. NTND. BS presnet.  Skin: no rashes, purpura or petechiae.  Neuro: Normal gait. PERLA. EOMi. Alert. Oriented x3. Psych: tearful. Appears scared. Normal affect, dress and demeanor. Normal speech. Normal thought content and judgment. EKG: NSR. HR 80, PR 134, QT 364. No ST changes.   Assessment/Plan: Patricia Berry is a 31 y.o. female present for OV for  - EKG WNL.  - CBC, TSH and lipids ordered.  -  consider GI cause vs cardiac. 2nd event could be secondary to meal, 1 st event occurred with laying flat (but not a meal).  - consider OTC PPI use.  - with FHX, morbid obesity and symptoms last ing about 1 hour- favor cardiology recs.  - discussed RF with patient including exercise, diet, cholesterol and weight management. - Discussed with pt if symptoms occur again to seek immediate treatment in the ED.  - referral to cardiology placed.   electronically signed by:  Howard Pouch, DO  [place]

## 2016-08-02 NOTE — Telephone Encounter (Signed)
Spoke with patient reviewed lab results. 

## 2016-08-05 ENCOUNTER — Other Ambulatory Visit (INDEPENDENT_AMBULATORY_CARE_PROVIDER_SITE_OTHER): Payer: BC Managed Care – PPO

## 2016-08-05 DIAGNOSIS — Z6841 Body Mass Index (BMI) 40.0 and over, adult: Secondary | ICD-10-CM

## 2016-08-05 DIAGNOSIS — R079 Chest pain, unspecified: Secondary | ICD-10-CM

## 2016-08-05 DIAGNOSIS — Z8249 Family history of ischemic heart disease and other diseases of the circulatory system: Secondary | ICD-10-CM | POA: Diagnosis not present

## 2016-08-05 LAB — LIPID PANEL
Cholesterol: 158 mg/dL (ref 0–200)
HDL: 42.3 mg/dL (ref >39.00)
LDL CALC: 101 mg/dL — AB (ref 0–99)
NONHDL: 115.75
Total CHOL/HDL Ratio: 4
Triglycerides: 72 mg/dL (ref 0.0–149.0)
VLDL: 14.4 mg/dL (ref 0.0–40.0)

## 2016-08-08 ENCOUNTER — Telehealth: Payer: Self-pay | Admitting: Family Medicine

## 2016-08-08 NOTE — Telephone Encounter (Signed)
Please call pt: - her cholesterol looks perfect. No concerns.  - Of course exercise > 150 minutes a week. Diet in low saturated fats and high in fiber are still encouraged.

## 2016-08-08 NOTE — Telephone Encounter (Signed)
Detailed message left on voice mail. Okay per DPR.

## 2016-08-12 ENCOUNTER — Encounter: Payer: Self-pay | Admitting: Cardiovascular Disease

## 2016-08-12 ENCOUNTER — Ambulatory Visit (INDEPENDENT_AMBULATORY_CARE_PROVIDER_SITE_OTHER): Payer: BC Managed Care – PPO | Admitting: Cardiovascular Disease

## 2016-08-12 VITALS — BP 157/87 | HR 72 | Ht 64.0 in | Wt 293.8 lb

## 2016-08-12 DIAGNOSIS — I10 Essential (primary) hypertension: Secondary | ICD-10-CM | POA: Diagnosis not present

## 2016-08-12 DIAGNOSIS — R079 Chest pain, unspecified: Secondary | ICD-10-CM | POA: Diagnosis not present

## 2016-08-12 MED ORDER — LISINOPRIL 10 MG PO TABS: 10.0000 mg | ORAL_TABLET | Freq: Every day | ORAL | 5 refills | Status: DC

## 2016-08-12 NOTE — Progress Notes (Signed)
Cardiology Office Note    Date:  08/12/2016   ID:  Patricia Berry, DOB 20-Aug-1985, MRN [id number]  PCP:  Howard Pouch, DO  Cardiologist:   Skeet Latch, MD   Chief Complaint  Patient presents with  . New Patient (Initial Visit)  . Shortness of Breath    occasionally.  . Chest Pain    pressure.  . Edema    in ankles      History of Present Illness: Patricia Berry is a 31 y.o. female who presents for an evaluation of chest pain.  Patricia Berry saw her PCP, Dr. Howard Pouch, on 08/01/16.  She reported episodes of chest pain   The first episode occurred in November when laying down.  She felt 5/10 substernal chest pain That lasted for approximately 1 hour before she is able to fall asleep. The second occurred after eating. She felt chest tightness and had difficulty breathing. There is also nausea and diaphoresis. This episode was 8/10 in severity. It felt somewhat like something was stuck in her chest and she tried coughing without relief.  Patricia Berry exercises by walking one to 2 miles several times per week She also walks her dog for 30 minutes each day. She has no chest pain or shortness of breath with this activity. She sometimes feels as though her ankles are swollen at the end of the day but this improves by morning. She denies orthopnea or PND. She has no family history of premature coronary artery disease.   No past medical history on file.  Past Surgical History:  Procedure Laterality Date  . NO PAST SURGERIES       Current Outpatient Prescriptions  Medication Sig Dispense Refill  . lisinopril (PRINIVIL,ZESTRIL) 10 MG tablet Take 1 tablet (10 mg total) by mouth daily. 30 tablet 5   No current facility-administered medications for this visit.     Allergies:   Patient has no known allergies.    Social History:  The patient  reports that she has never smoked. She has never used smokeless tobacco. She reports that she drinks about 1.2 oz of alcohol per week . She  reports that she does not use drugs.   Family History:  The patient's family history includes Cancer in her maternal aunt; Early death in her maternal grandmother; Heart disease in her maternal grandmother; Hyperlipidemia in her mother; Mental illness in her father and paternal grandmother; Prostate cancer in her maternal grandfather.    ROS:  Please see the history of present illness.   Otherwise, review of systems are positive for none.   All other systems are reviewed and negative.    PHYSICAL EXAM: VS:  BP (!) 157/87   Pulse 72   Ht 5' 4"  (1.626 m)   Wt 133.3 kg (293 lb 12.8 oz)   BMI 50.43 kg/m  , BMI Body mass index is 50.43 kg/m. GENERAL:  Well appearing.  Morbidly obese HEENT:  Pupils equal round and reactive, fundi not visualized, oral mucosa unremarkable NECK:  No jugular venous distention, waveform within normal limits, carotid upstroke brisk and symmetric, no bruits, no thyromegaly LYMPHATICS:  No cervical adenopathy LUNGS:  Clear to auscultation bilaterally HEART:  RRR.  PMI not displaced or sustained,S1 and S2 within normal limits, no S3, no S4, no clicks, no rubs, no murmurs ABD:  Flat, positive bowel sounds normal in frequency in pitch, no bruits, no rebound, no guarding, no midline pulsatile mass, no hepatomegaly, no splenomegaly EXT:  2 plus pulses  throughout, no edema, no cyanosis no clubbing SKIN:  No rashes no nodules NEURO:  Cranial nerves II through XII grossly intact, motor grossly intact throughout PSYCH:  Cognitively intact, oriented to person place and time    EKG:  EKG is not ordered today.  08/01/16: Sinus rhythm. Rate 80 bpm.    Recent Labs: 08/01/2016: Hemoglobin 12.8; Platelets 328.0; TSH 3.79    Lipid Panel    Component Value Date/Time   CHOL 158 08/05/2016 0802   TRIG 72.0 08/05/2016 0802   HDL 42.30 08/05/2016 0802   CHOLHDL 4 08/05/2016 0802   VLDL 14.4 08/05/2016 0802   LDLCALC 101 (H) 08/05/2016 0802      Wt Readings from Last 3  Encounters:  08/12/16 133.3 kg (293 lb 12.8 oz)  08/01/16 (!) 144.9 kg (319 lb 8 oz)  10/13/14 129.7 kg (286 lb)      ASSESSMENT AND PLAN:  # Atypical chest pain: Symptoms seem most consistent with GERD or esophageal spasm.  Agree with starting omeprazole.  However, we will obtain an ETT to ensure this is not ischemia.  # Hypertension: BP elevated initially and on repeat.  It was also high with her PCP.  We will start lisinopril 10 mg daily.  Follow up with pharmacy in 2 weeks and check BMP at that time.   Goal <130/80   Current medicines are reviewed at length with the patient today.  The patient does not have concerns regarding medicines.  The following changes have been made:  Lisinopril 10 mg daily.   Labs/ tests ordered today include:   Orders Placed This Encounter  Procedures  . Exercise Tolerance Test     Disposition:   FU with Aniston Christman C. Oval Linsey, MD, [facility] in 6 months.  Pharmacy for BP in 2 weeks.    This note was written with the assistance of speech recognition software.  Please excuse any transcriptional errors.  Signed, Fox Salminen C. Oval Linsey, MD, [facility]  08/12/2016 5:15 PM    [facility]

## 2016-08-12 NOTE — Patient Instructions (Addendum)
Medication Instructions:  START LISINOPRIL 10 MG DAILY   Labwork: NONE  Testing/Procedures: Your physician has requested that you have an exercise tolerance test. For further information please visit [email or web address]. Please also follow instruction sheet, as given.  Follow-Up: Your physician recommends that you schedule a follow-up appointment in: PHARM D IN 2 WEEKS FOR BLOOD PRESSURE  Your physician wants you to follow-up in: [place] will receive a reminder letter in the mail two months in advance. If you don't receive a letter, please call our office to schedule the follow-up appointment.  If you need a refill on your cardiac medications before your next appointment, please call your pharmacy.

## 2016-08-16 ENCOUNTER — Inpatient Hospital Stay (HOSPITAL_COMMUNITY): Admission: RE | Admit: 2016-08-16 | Payer: BC Managed Care – PPO | Source: Ambulatory Visit

## 2016-08-30 ENCOUNTER — Ambulatory Visit: Payer: BC Managed Care – PPO

## 2016-09-06 ENCOUNTER — Ambulatory Visit: Payer: BC Managed Care – PPO

## 2016-09-13 ENCOUNTER — Encounter (HOSPITAL_COMMUNITY): Payer: BC Managed Care – PPO

## 2016-10-27 ENCOUNTER — Encounter: Payer: Self-pay | Admitting: Family Medicine

## 2016-10-27 ENCOUNTER — Ambulatory Visit (INDEPENDENT_AMBULATORY_CARE_PROVIDER_SITE_OTHER): Payer: BC Managed Care – PPO | Admitting: Family Medicine

## 2016-10-27 VITALS — BP 126/90 | HR 84 | Temp 98.2°F | Resp 20 | Wt 322.0 lb

## 2016-10-27 DIAGNOSIS — R110 Nausea: Secondary | ICD-10-CM | POA: Diagnosis not present

## 2016-10-27 DIAGNOSIS — H8110 Benign paroxysmal vertigo, unspecified ear: Secondary | ICD-10-CM

## 2016-10-27 DIAGNOSIS — R42 Dizziness and giddiness: Secondary | ICD-10-CM

## 2016-10-27 DIAGNOSIS — H6121 Impacted cerumen, right ear: Secondary | ICD-10-CM | POA: Diagnosis not present

## 2016-10-27 LAB — POC URINALSYSI DIPSTICK (AUTOMATED)
Bilirubin, UA: NEGATIVE
Blood, UA: NEGATIVE
Glucose, UA: NEGATIVE
Ketones, UA: NEGATIVE
LEUKOCYTES UA: NEGATIVE
NITRITE UA: NEGATIVE
PH UA: 6 (ref 5.0–8.0)
Protein, UA: NEGATIVE
Spec Grav, UA: 1.02 (ref 1.010–1.025)
UROBILINOGEN UA: 0.2 U/dL

## 2016-10-27 LAB — BASIC METABOLIC PANEL WITH GFR
BUN: 11 mg/dL (ref 7–25)
CHLORIDE: 105 mmol/L (ref 98–110)
CO2: 23 mmol/L (ref 20–31)
Calcium: 9.4 mg/dL (ref 8.6–10.2)
Creat: 0.81 mg/dL (ref 0.50–1.10)
GFR, Est Non African American: >89 mL/min (ref >=60)
Glucose, Bld: 98 mg/dL (ref 65–99)
POTASSIUM: 4.6 mmol/L (ref 3.5–5.3)
SODIUM: 138 mmol/L (ref 135–146)

## 2016-10-27 LAB — POCT URINE PREGNANCY: PREG TEST UR: NEGATIVE

## 2016-10-27 MED ORDER — MECLIZINE HCL 25 MG PO TABS: 25.0000 mg | ORAL_TABLET | Freq: Three times a day (TID) | ORAL | 0 refills | Status: DC | PRN

## 2016-10-27 MED ORDER — CARBAMIDE PEROXIDE 6.5 % OT SOLN: 5.0000 [drp] | Freq: Two times a day (BID) | OTIC | 0 refills | Status: DC

## 2016-10-27 NOTE — Patient Instructions (Signed)
Benign Positional Vertigo Vertigo is the feeling that you or your surroundings are moving when they are not. Benign positional vertigo is the most common form of vertigo. The cause of this condition is not serious (is benign). This condition is triggered by certain movements and positions (is positional). This condition can be dangerous if it occurs while you are doing something that could endanger you or others, such as driving. What are the causes? In many cases, the cause of this condition is not known. It may be caused by a disturbance in an area of the inner ear that helps your brain to sense movement and balance. This disturbance can be caused by a viral infection (labyrinthitis), head injury, or repetitive motion. What increases the risk? This condition is more likely to develop in:  Women.  People who are 68 years of age or older. What are the signs or symptoms? Symptoms of this condition usually happen when you move your head or your eyes in different directions. Symptoms may start suddenly, and they usually last for less than a minute. Symptoms may include:  Loss of balance and falling.  Feeling like you are spinning or moving.  Feeling like your surroundings are spinning or moving.  Nausea and vomiting.  Blurred vision.  Dizziness.  Involuntary eye movement (nystagmus). Symptoms can be mild and cause only slight annoyance, or they can be severe and interfere with daily life. Episodes of benign positional vertigo may return (recur) over time, and they may be triggered by certain movements. Symptoms may improve over time. How is this diagnosed? This condition is usually diagnosed by medical history and a physical exam of the head, neck, and ears. You may be referred to a health care provider who specializes in ear, nose, and throat (ENT) problems (otolaryngologist) or a provider who specializes in disorders of the nervous system (neurologist). You may have additional testing,  including:  MRI.  A CT scan.  Eye movement tests. Your health care provider may ask you to change positions quickly while he or she watches you for symptoms of benign positional vertigo, such as nystagmus. Eye movement may be tested with an electronystagmogram (ENG), caloric stimulation, the Dix-Hallpike test, or the roll test.  An electroencephalogram (EEG). This records electrical activity in your brain.  Hearing tests. How is this treated? Usually, your health care provider will treat this by moving your head in specific positions to adjust your inner ear back to normal. Surgery may be needed in severe cases, but this is rare. In some cases, benign positional vertigo may resolve on its own in 2-4 weeks. Follow these instructions at home: Safety   Move slowly.Avoid sudden body or head movements.  Avoid driving.  Avoid operating heavy machinery.  Avoid doing any tasks that would be dangerous to you or others if a vertigo episode would occur.  If you have trouble walking or keeping your balance, try using a cane for stability. If you feel dizzy or unstable, sit down right away.  Return to your normal activities as told by your health care provider. Ask your health care provider what activities are safe for you. General instructions   Take over-the-counter and prescription medicines only as told by your health care provider.  Avoid certain positions or movements as told by your health care provider.  Drink enough fluid to keep your urine clear or pale yellow.  Keep all follow-up visits as told by your health care provider. This is important. Contact a health care provider  if:  You have a fever.  Your condition gets worse or you develop new symptoms.  Your family or friends notice any behavioral changes.  Your nausea or vomiting gets worse.  You have numbness or a "pins and needles" sensation. Get help right away if:  You have difficulty speaking or moving.  You are  always dizzy.  You faint.  You develop severe headaches.  You have weakness in your legs or arms.  You have changes in your hearing or vision.  You develop a stiff neck.  You develop sensitivity to light. This information is not intended to replace advice given to you by your health care provider. Make sure you discuss any questions you have with your health care provider. Document Released: 03/14/2006 Document Revised: 11/12/2015 Document Reviewed: 09/29/2014 Elsevier Interactive Patient Education  2017 [employer].  How to Perform the Epley Maneuver The Epley maneuver is an exercise that relieves symptoms of vertigo. Vertigo is the feeling that you or your surroundings are moving when they are not. When you feel vertigo, you may feel like the room is spinning and have trouble walking. Dizziness is a little different than vertigo. When you are dizzy, you may feel unsteady or light-headed. You can do this maneuver at home whenever you have symptoms of vertigo. You can do it up to 3 times a day until your symptoms go away. Even though the Epley maneuver may relieve your vertigo for a few weeks, it is possible that your symptoms will return. This maneuver relieves vertigo, but it does not relieve dizziness. What are the risks? If it is done correctly, the Epley maneuver is considered safe. Sometimes it can lead to dizziness or nausea that goes away after a short time. If you develop other symptoms, such as changes in vision, weakness, or numbness, stop doing the maneuver and call your health care provider. How to perform the Epley maneuver 1. Sit on the edge of a bed or table with your back straight and your legs extended or hanging over the edge of the bed or table. 2. Turn your head halfway toward the affected ear or side. 3. Lie backward quickly with your head turned until you are lying flat on your back. You may want to position a pillow under your shoulders. 4. Hold this position for  30 seconds. You may experience an attack of vertigo. This is normal. 5. Turn your head to the opposite direction until your unaffected ear is facing the floor. 6. Hold this position for 30 seconds. You may experience an attack of vertigo. This is normal. Hold this position until the vertigo stops. 7. Turn your whole body to the same side as your head. Hold for another 30 seconds. 8. Sit back up. You can repeat this exercise up to 3 times a day. Follow these instructions at home:  After doing the Epley maneuver, you can return to your normal activities.  Ask your health care provider if there is anything you should do at home to prevent vertigo. He or she may recommend that you:  Keep your head raised (elevated) with two or more pillows while you sleep.  Do not sleep on the side of your affected ear.  Get up slowly from bed.  Avoid sudden movements during the day.  Avoid extreme head movement, like looking up or bending over. Contact a health care provider if:  Your vertigo gets worse.  You have other symptoms, including:  Nausea.  Vomiting.  Headache. Get help  right away if:  You have vision changes.  You have a severe or worsening headache or neck pain.  You cannot stop vomiting.  You have new numbness or weakness in any part of your body. Summary  Vertigo is the feeling that you or your surroundings are moving when they are not.  The Epley maneuver is an exercise that relieves symptoms of vertigo.  If the Epley maneuver is done correctly, it is considered safe. You can do it up to 3 times a day. This information is not intended to replace advice given to you by your health care provider. Make sure you discuss any questions you have with your health care provider. Document Released: 06/11/2013 Document Revised: 04/26/2016 Document Reviewed: 04/26/2016 Elsevier Interactive Patient Education  2017 [employer].   Take meclizine if needed as directed.  Use debrox  to clean ear.  HYDRATE--> water  If not improved or worsening in 2 weeks, then we may need to investigate further.

## 2016-10-27 NOTE — Progress Notes (Signed)
Patricia Berry , 05/20/1986, 31 y.o., female MRN: [id number] Patient Care Team    Relationship Specialty Notifications Start End  Ma Hillock, DO PCP - General Family Medicine  08/01/16     Chief Complaint  Patient presents with  . Dizziness    nausea     Subjective:  Patricia Berry is a 31 y.o. female presents for 1 day history of dizziness and nausea. Patient states she has never felt dizziness like this before. She states her dizziness has been constant since yesterday, but it comes in waves with worsening dizziness and nausea. She describes the dizziness as the room spinning. The condition is made worse by head movement. She reports it started yesterday when looking at her computer screen at work. She noticed the room started spinning. When she stood up she felt a little off balance. She went home from work and took 2 Aleve and laid down. She felt a little better, and took NyQuil before bed. She reports when she woke up this morning she felt okay, there was no spinning, but she felt a little "off". After a few hours of work, she noticed the room started spinning again, she made the appointment to be seen. She denies fever, chills, vomiting, diarrhea, dysuria, urinary frequency,  abdominal pain, headache or recent illness.Patient's last menstrual period was 10/12/2016. She reports she and her husband are "not- not trying to have a baby", therefore there is a potential she could be pregnant, but her periods have been normal.  Depression screen [name] 2/9 08/01/2016  Decreased Interest 0  Down, Depressed, Hopeless 0  PHQ - 2 Score 0    No Known Allergies Social History  Substance Use Topics  . Smoking status: Never Smoker  . Smokeless tobacco: Never Used  . Alcohol use 1.2 oz/week    2 Glasses of wine per week   History reviewed. No pertinent past medical history. Past Surgical History:  Procedure Laterality Date  . NO PAST SURGERIES     Family History  Problem Relation Age  of Onset  . Hyperlipidemia Mother   . Mental illness Father   . Cancer Maternal Aunt        small cell  . Heart disease Maternal Grandmother        heart attack  . Early death Maternal Grandmother   . Prostate cancer Maternal Grandfather   . Mental illness Paternal Grandmother    Allergies as of 10/27/2016   No Known Allergies     Medication List       Accurate as of 10/27/16 12:25 PM. Always use your most recent med list.          carbamide peroxide 6.5 % otic solution Commonly known as:  DEBROX Place 5 drops into the right ear 2 (two) times daily.   meclizine 25 MG tablet Commonly known as:  ANTIVERT Take 1 tablet (25 mg total) by mouth 3 (three) times daily as needed for dizziness.       All past medical history, surgical history, allergies, family history, immunizations andmedications were updated in the EMR today and reviewed under the history and medication portions of their EMR.     ROS: Negative, with the exception of above mentioned in HPI   Objective:  BP 126/90 (BP Location: Left Arm, Patient Position: Standing, Cuff Size: Large)   Pulse 84   Temp 98.2 F (36.8 C)   Resp 20   Wt (!) 322 lb (146.1 kg)   LMP 10/12/2016  SpO2 98%   BMI 55.27 kg/m  Body mass index is 55.27 kg/m. Gen: Afebrile. No acute distress. Nontoxic in appearance, well developed, well nourished.  HENT: AT. [place]. Bilateral TM visualized left tympanic membrane normal, right tympanic membrane with cerumen attached to TM. MMM, no oral lesions. Bilateral nares within normal limits. Throat without erythema or exudates. No cough, no hoarseness. Eyes:Pupils Equal Round Reactive to light, Extraocular movements intact,  Conjunctiva without redness, discharge or icterus. Neck/lymp/endocrine: Supple, no lymphadenopathy CV: RRR  Chest: CTAB, no wheeze or crackles. Good air movement, normal resp effort.  Abd: Soft. NTND. BS present  Neuro:  Normal gait. PERLA. EOMi. Alert. Oriented x3    No  exam data present No results found. Results for orders placed or performed in visit on 10/27/16 (from the past 24 hour(s))  POCT Urinalysis Dipstick (Automated)     Status: None   Collection Time: 10/27/16  9:57 AM  Result Value Ref Range   Color, UA yellow    Clarity, UA clear    Glucose, UA negative    Bilirubin, UA negative    Ketones, UA negative    Spec Grav, UA 1.020 1.010 - 1.025   Blood, UA negative    pH, UA 6.0 5.0 - 8.0   Protein, UA negative    Urobilinogen, UA 0.2 0.2 or 1.0 E.U./dL   Nitrite, UA negative    Leukocytes, UA Negative Negative  POCT urine pregnancy     Status: None   Collection Time: 10/27/16 10:04 AM  Result Value Ref Range   Preg Test, Ur Negative Negative    Assessment/Plan: Patricia Berry is a 30 y.o. female present for OV for  Dizziness/nausea BPPV Cerumen and tympanic membrane - Discussed differential diagnosis with patient today. Patient's symptoms sound consistent with BPPV. Was unable to do Dix-Hallpike maneuver secondary to body habitus. - Negative urinalysis and negative pregnancy test today. - Patient was encouraged to hydrate with water. Meclizine prescribed 3 times a day when necessary. Discussed nature of the course for BPPV. AVS information provided for education. - POCT urine pregnancy - POCT Urinalysis Dipstick (Automated) - BASIC METABOLIC PANEL WITH GFR - Follow-up 2-4 weeks if no improvement or worsening.  Reviewed expectations re: course of current medical issues.  Discussed self-management of symptoms.  Outlined signs and symptoms indicating need for more acute intervention.  Patient verbalized understanding and all questions were answered.  Patient received an After-Visit Summary.    Note is dictated utilizing voice recognition software. Although note has been proof read prior to signing, occasional typographical errors still can be missed. If any questions arise, please do not hesitate to call for verification.    electronically signed by:  Howard Pouch, DO  [place]

## 2016-10-28 ENCOUNTER — Telehealth: Payer: Self-pay | Admitting: Family Medicine

## 2016-10-28 NOTE — Telephone Encounter (Signed)
Left message with lab results on patient voice mail per DPR.

## 2016-10-28 NOTE — Telephone Encounter (Signed)
Please call pt: Her blood work was normal.

## 2017-01-19 ENCOUNTER — Encounter: Payer: Self-pay | Admitting: Family Medicine

## 2017-01-19 ENCOUNTER — Ambulatory Visit (INDEPENDENT_AMBULATORY_CARE_PROVIDER_SITE_OTHER): Payer: BC Managed Care – PPO | Admitting: Family Medicine

## 2017-01-19 VITALS — BP 129/85 | HR 82 | Temp 98.4°F | Resp 16 | Ht 64.0 in | Wt 325.5 lb

## 2017-01-19 DIAGNOSIS — L02419 Cutaneous abscess of limb, unspecified: Secondary | ICD-10-CM

## 2017-01-19 MED ORDER — MUPIROCIN 2 % EX OINT: 1.0000 "application " | TOPICAL_OINTMENT | Freq: Three times a day (TID) | CUTANEOUS | 0 refills | Status: DC

## 2017-01-19 MED ORDER — SULFAMETHOXAZOLE-TRIMETHOPRIM 800-160 MG PO TABS: 1.0000 | ORAL_TABLET | Freq: Two times a day (BID) | ORAL | 0 refills | Status: DC

## 2017-01-19 NOTE — Progress Notes (Signed)
OFFICE VISIT  01/19/2017   CC:  Chief Complaint  Patient presents with  . Bump on leg   HPI:    Patient is a 31 y.o.  female who presents for "bump on leg". First noted this on right lower leg about 1 mo ago when she was in [place] and started as small bump, gradually enlarging, peeling a bit in center as it grew, sensitive to touch, + tender.  No f/c/malaise. She has put neosporin on it but no help.  She soaked it a couple of nights in warm water. It has not drained any material. Never had anything like this in the past.   History reviewed. No pertinent past medical history.  Past Surgical History:  Procedure Laterality Date  . NO PAST SURGERIES      Outpatient Medications Prior to Visit  Medication Sig Dispense Refill  . carbamide peroxide (DEBROX) 6.5 % otic solution Place 5 drops into the right ear 2 (two) times daily. (Patient not taking: Reported on 01/19/2017) 15 mL 0  . meclizine (ANTIVERT) 25 MG tablet Take 1 tablet (25 mg total) by mouth 3 (three) times daily as needed for dizziness. (Patient not taking: Reported on 01/19/2017) 30 tablet 0   No facility-administered medications prior to visit.     No Known Allergies  ROS As per HPI  PE: Blood pressure 129/85, pulse 82, temperature 98.4 F (36.9 C), temperature source Oral, resp. rate 16, height 5' 4"  (1.626 m), weight (!) 325 lb 8 oz (147.6 kg), last menstrual period 01/08/2017, SpO2 98 %. Body mass index is 55.87 kg/m.  Gen: Alert, well appearing.  Patient is oriented to person, place, time, and situation. AFFECT: pleasant, lucid thought and speech. Right  ankle on medial aspect has violaceous colored soft nodular lesion with blanching and fluctuance and tenderness.  Size is 2-3 cm.  No streaking.  Minimal surrounding erythema.  LABS:  none  IMPRESSION AND PLAN:  Right lower leg/calf skin abscess. Discussed ongoing conservative treatment measures vs I&D in office today, and pt chose I&D in office today. I  rx'd a 7d course of bactrim DS bid and also rx'd bactroban ointment to apply tid until skin defect is healed.  Procedure: Incision and drainage of abscess/cyst on right lower leg.  The indication for the procedure was explained to the patient, benefits and risks of procedure were outlined for patient, patient agreed to proceed.  Steps of the procedure were clearly explained to the patient prior to starting. Injected lesion with 2 ml of 2% lidocaine with epi for local anesthesia.  Incised central portion of lesion 66m punch biopsy instrument and used manual pressure and hemostats to express contents and encourage complete drainage.  Culture swab of lesion contents obtained and sent to lab.  Wound dressed.  No bleeding.  Patient tolerated procedure well.  No immediate complications.  Wound care instructions given.  Warning signs of infection discussed. Follow up discussed.  Call or return for problems.  INSTRUCTIONS: Apply warm compress to area for 30 min once a day. Cover with gauze dressing or band-aid.  Shower/bath is ok. No getting in ocean or pool until the skin is completely sealed again.   An After Visit Summary was printed and given to the patient.  FOLLOW UP: Return if symptoms worsen or fail to improve.  Signed:  PCrissie Sickles MD           01/19/2017

## 2017-01-19 NOTE — Patient Instructions (Signed)
Apply warm compress to area for 30 min once a day. Cover with gauze dressing or band-aid.  Shower/bath is ok. No getting in ocean or pool until the skin is completely sealed again.

## 2017-01-22 LAB — WOUND CULTURE
GRAM STAIN: NONE SEEN
Gram Stain: NONE SEEN
Organism ID, Bacteria: NO GROWTH

## 2017-01-30 ENCOUNTER — Encounter: Payer: Self-pay | Admitting: Family Medicine

## 2017-01-30 ENCOUNTER — Ambulatory Visit (INDEPENDENT_AMBULATORY_CARE_PROVIDER_SITE_OTHER): Payer: BC Managed Care – PPO | Admitting: Family Medicine

## 2017-01-30 VITALS — BP 128/87 | HR 83 | Temp 98.1°F | Resp 20 | Ht 64.0 in | Wt 330.2 lb

## 2017-01-30 DIAGNOSIS — L0291 Cutaneous abscess, unspecified: Secondary | ICD-10-CM | POA: Insufficient documentation

## 2017-01-30 DIAGNOSIS — B001 Herpesviral vesicular dermatitis: Secondary | ICD-10-CM | POA: Diagnosis not present

## 2017-01-30 MED ORDER — TRIAMCINOLONE ACETONIDE 0.1 % EX CREA: 1.0000 "application " | TOPICAL_CREAM | Freq: Two times a day (BID) | CUTANEOUS | 0 refills | Status: DC

## 2017-01-30 MED ORDER — VALACYCLOVIR HCL 1 G PO TABS: ORAL_TABLET | ORAL | 2 refills | Status: DC

## 2017-01-30 NOTE — Patient Instructions (Addendum)
Start using Hibiclens to wash legs with for a few weeks. It is very gentle.   Use the triamcinolone on the left leg and continue Bactroban on the right leg (while open).   Valtrex for your mouth ulcer.   Make sure to clean shower/tub and any soap dishes etc well with bleach and water.  If worsening or do not resolve then I would want to see you back.

## 2017-01-30 NOTE — Progress Notes (Signed)
Patricia Berry , 1986-04-07, 31 y.o., female MRN: [id number] Patient Care Team    Relationship Specialty Notifications Start End  Patricia Hillock, DO PCP - General Family Medicine  08/01/16     Chief Complaint  Patient presents with  . Abscess    bilateral lower legs     Subjective: Pt presents for an OV with complaints of abscess. She was seen 2 weeks ago with I&D of right lower leg abscess, with negative cultures. She finished the bactrim and has been using the Bactroban ointment on the area three times a day (Seen By Dr. Anitra Berry). She returns today for follow-up on abscess on right lower extremity. She reports it is not draining or red, but it is rather purple surrounding the I&D area. She has also noticed a small red area on her left knee medial lower extremity which she is concerned is another abscess formation. She denies any drainage from this area as well. She denies any insect bites or trauma to these areas. She denies the use of any prescription medications, or changes in her supplements. She has never had abscesses in the past. She denies fevers, chills, nausea, vomit or rash. She endorses having increase in cold sores, but this is normal after a trip to the beach which she has just returned from.  Depression screen PHQ 2/9 08/01/2016  Decreased Interest 0  Down, Depressed, Hopeless 0  PHQ - 2 Score 0    No Known Allergies Social History  Substance Use Topics  . Smoking status: Never Smoker  . Smokeless tobacco: Never Used  . Alcohol use 1.2 oz/week    2 Glasses of wine per week   No past medical history on file. Past Surgical History:  Procedure Laterality Date  . NO PAST SURGERIES     Family History  Problem Relation Age of Onset  . Hyperlipidemia Mother   . Mental illness Father   . Cancer Maternal Aunt        small cell  . Heart disease Maternal Grandmother        heart attack  . Early death Maternal Grandmother   . Prostate cancer Maternal Grandfather     . Mental illness Paternal Grandmother    Allergies as of 01/30/2017   No Known Allergies     Medication List       Accurate as of 01/30/17 10:54 AM. Always use your most recent med list.          mupirocin ointment 2 % Commonly known as:  BACTROBAN Apply 1 application topically 3 (three) times daily.       All past medical history, surgical history, allergies, family history, immunizations andmedications were updated in the EMR today and reviewed under the history and medication portions of their EMR.     ROS: Negative, with the exception of above mentioned in HPI   Objective:  BP 128/87 (BP Location: Left Arm, Patient Position: Sitting, Cuff Size: Large)   Pulse 83   Temp 98.1 F (36.7 C)   Resp 20   Ht 5' 4"  (1.626 m)   Wt (!) 330 lb 4 oz (149.8 kg)   LMP 01/08/2017   SpO2 99%   BMI 56.69 kg/m  Body mass index is 56.69 kg/m. Gen: Afebrile. No acute distress. Nontoxic in appearance, well developed, well nourished.  HENT: AT. [place]. MMM, small ulceration right lower lip. Eyes:Pupils Equal Round Reactive to light, Extraocular movements intact,  Conjunctiva without redness, discharge or icterus. Skin: No  rashes, purpura or petechiae. Violaceous right anterior lower extremity/ankle punch biopsy site. No drainage, no erythema. Mild tenderness. Approximately 1 cm red nodular left mid shin lesion, no drainage or tenderness. Neuro: Normal gait. PERLA. EOMi. Alert. Oriented x3  No exam data present No results found. No results found for this or any previous visit (from the past 24 hour(s)).  Assessment/Plan: Patricia Berry is a 31 y.o. female present for OV for  Cold sore Valtrex prescribed for cold sore treatment.  Abscess Did not see her original abscess, reports of fluctuance were observed at that time. Differential diagnosis would be erythema nodosum. Discussed with patient the possibility of this being inflammatory reaction and not infectious. To be complete,  encouraged her to cleanse lower extremities with Hibiclens. Make certain all bath/shower accessories are thrown out or soaked in bleach/water.  Continue Bactroban ointment on biopsy site until completely healed. Kenalog cream to new site. If does not resolve, or worsening would want to evaluate further.   Reviewed expectations re: course of current medical issues.  Discussed self-management of symptoms.  Outlined signs and symptoms indicating need for more acute intervention.  Patient verbalized understanding and all questions were answered.  Patient received an After-Visit Summary.    No orders of the defined types were placed in this encounter.    Note is dictated utilizing voice recognition software. Although note has been proof read prior to signing, occasional typographical errors still can be missed. If any questions arise, please do not hesitate to call for verification.   electronically signed by:  Patricia Pouch, DO  [place]

## 2017-03-20 LAB — OB RESULTS CONSOLE HIV ANTIBODY (ROUTINE TESTING): HIV: NONREACTIVE

## 2017-03-20 LAB — HM PAP SMEAR: HM Pap smear: NORMAL

## 2017-03-20 LAB — OB RESULTS CONSOLE GC/CHLAMYDIA
CHLAMYDIA, DNA PROBE: NEGATIVE
GC PROBE AMP, GENITAL: NEGATIVE

## 2017-03-20 LAB — OB RESULTS CONSOLE RUBELLA ANTIBODY, IGM: Rubella: IMMUNE

## 2017-03-20 LAB — OB RESULTS CONSOLE ANTIBODY SCREEN: Antibody Screen: NEGATIVE

## 2017-03-20 LAB — OB RESULTS CONSOLE ABO/RH: RH Type: POSITIVE

## 2017-03-20 LAB — HIV ANTIBODY (ROUTINE TESTING W REFLEX): HIV: NEGATIVE

## 2017-03-20 LAB — OB RESULTS CONSOLE HEPATITIS B SURFACE ANTIGEN: HEP B S AG: NEGATIVE

## 2017-03-20 LAB — OB RESULTS CONSOLE RPR: RPR: NONREACTIVE

## 2017-05-31 ENCOUNTER — Encounter: Payer: Self-pay | Admitting: Family Medicine

## 2017-05-31 ENCOUNTER — Ambulatory Visit: Payer: BC Managed Care – PPO | Admitting: Family Medicine

## 2017-05-31 VITALS — BP 121/82 | HR 104 | Temp 97.9°F | Wt 319.0 lb

## 2017-05-31 DIAGNOSIS — J0100 Acute maxillary sinusitis, unspecified: Secondary | ICD-10-CM | POA: Diagnosis not present

## 2017-05-31 DIAGNOSIS — R000 Tachycardia, unspecified: Secondary | ICD-10-CM

## 2017-05-31 MED ORDER — AMOXICILLIN-POT CLAVULANATE 875-125 MG PO TABS: 1.0000 | ORAL_TABLET | Freq: Two times a day (BID) | ORAL | 0 refills | Status: DC

## 2017-05-31 NOTE — Progress Notes (Signed)
Patricia Berry , 08/11/1985, 31 y.o., female MRN: [id number] Patient Care Team    Relationship Specialty Notifications Start End  Ma Hillock, DO PCP - General Family Medicine  08/01/16     Chief Complaint  Patient presents with  . URI     5 days sorethroat and congestion     Subjective: Pt presents for an OV with complaints of > 5 days of sore throat, nasal congestion, sinus congestion, cough with increase phlegm production. She denies fever, chills, diarrhea or vomit. She is ~[redacted] weeks pregnant and has continued morning nausea. She has been taking her prenatal vitamin , throat lozenges, saline nasal spray and throat spray approved by her OB. She has not taken any tylenol. She has not routinely felt her baby move yet, so she can not endorse nor deny decreased fetal movement. She has had a decrease appetite, but has been drinking water to try to remain hydrated. Her pregnancy is at increased risk secondary to her weight per patient.   Depression screen PHQ 2/9 08/01/2016  Decreased Interest 0  Down, Depressed, Hopeless 0  PHQ - 2 Score 0    No Known Allergies Social History   Tobacco Use  . Smoking status: Never Smoker  . Smokeless tobacco: Never Used  Substance Use Topics  . Alcohol use: Yes    Alcohol/week: 1.2 oz    Types: 2 Glasses of wine per week   History reviewed. No pertinent past medical history. Past Surgical History:  Procedure Laterality Date  . NO PAST SURGERIES     Family History  Problem Relation Age of Onset  . Hyperlipidemia Mother   . Mental illness Father   . Cancer Maternal Aunt        small cell  . Heart disease Maternal Grandmother        heart attack  . Early death Maternal Grandmother   . Prostate cancer Maternal Grandfather   . Mental illness Paternal Grandmother    Allergies as of 05/31/2017   No Known Allergies     Medication List        Accurate as of 05/31/17  1:49 PM. Always use your most recent med list.            mupirocin ointment 2 % Commonly known as:  BACTROBAN Apply 1 application topically 3 (three) times daily.   triamcinolone cream 0.1 % Commonly known as:  KENALOG Apply 1 application topically 2 (two) times daily.   valACYclovir 1000 MG tablet Commonly known as:  VALTREX 2g once then repeat dose in 12 hours once at onset of cold sore       All past medical history, surgical history, allergies, family history, immunizations andmedications were updated in the EMR today and reviewed under the history and medication portions of their EMR.     ROS: Negative, with the exception of above mentioned in HPI   Objective:  BP 121/82 (BP Location: Right Arm, Patient Position: Sitting, Cuff Size: Large)   Pulse (!) 104   Temp 97.9 F (36.6 C)   Wt (!) 319 lb (144.7 kg)   LMP 01/08/2017   SpO2 98%   BMI 54.76 kg/m  Body mass index is 54.76 kg/m. Gen: Afebrile. No acute distress. Nontoxic in appearance, well developed, well nourished.  HENT: AT. [place]. Bilateral TM visualized without erythema or bulging. MMM, no oral lesions. Bilateral nares with erythema, drainage and swelling. Throat without erythema or exudates. PND present. Mild cough present. TTP max sinus.  Eyes:Pupils Equal Round Reactive to light, Extraocular movements intact,  Conjunctiva without redness, discharge or icterus. Neck/lymp/endocrine: Supple,no lymphadenopathy CV: mild tachycardia. No murmur.  Chest: CTAB, no wheeze or crackles. Good air movement, normal resp effort.  Abd: Soft. Obese.  NTND. BS present.  Skin: no rashes, purpura or petechiae.  Neuro:  Normal gait. PERLA. EOMi. Alert. Oriented x3   No exam data present No results found. No results found for this or any previous visit (from the past 24 hour(s)).  Assessment/Plan: Patricia Berry is a 31 y.o. female present for OV for  Acute maxillary sinusitis, recurrence not specified Tachycardia Rest, hydrate. Strongly encouraged patient to increase her water  intake with mild tachycardia, could be early dehydration. Increase need with pregnancy and illness.  Continue  nasal saline and OB approved OTC supportive therapy.  augmentin prescribed, take until completed.  F/U 2 weeks of not improved. If worsening followup with OB or be seen at [facility] hospital. We do not have the equipment to monitor pregnancy/baby. Pt reports understanding.     Reviewed expectations re: course of current medical issues.  Discussed self-management of symptoms.  Outlined signs and symptoms indicating need for more acute intervention.  Patient verbalized understanding and all questions were answered.  Patient received an After-Visit Summary.    No orders of the defined types were placed in this encounter.    Note is dictated utilizing voice recognition software. Although note has been proof read prior to signing, occasional typographical errors still can be missed. If any questions arise, please do not hesitate to call for verification.   electronically signed by:  Howard Pouch, DO  [place]

## 2017-05-31 NOTE — Patient Instructions (Signed)
I have called Augmentin every 12 hours for 10 days.  Continue the medication your OB stated were safe for you to take. You need to drink more water. You have signs of mild dehydration. If worsening please be seen by your OB or womens hospital.   Studies suggest that taking vitamin B6 for morning sickness greatly improves nausea, though not vomiting, for many pregnant women. There has been no sign of harm to the fetus with vitamin B6 use. A typical dose of vitamin B6 for morning sickness is 10 mg to 25 mg, 3 times a day.   You appear to have a sinus infection.    Sinusitis, Adult Sinusitis is soreness and inflammation of your sinuses. Sinuses are hollow spaces in the bones around your face. They are located:  Around your eyes.  In the middle of your forehead.  Behind your nose.  In your cheekbones.  Your sinuses and nasal passages are lined with a stringy fluid (mucus). Mucus normally drains out of your sinuses. When your nasal tissues get inflamed or swollen, the mucus can get trapped or blocked so air cannot flow through your sinuses. This lets bacteria, viruses, and funguses grow, and that leads to infection. Follow these instructions at home: Medicines  Take, use, or apply over-the-counter and prescription medicines only as told by your doctor. These may include nasal sprays.  If you were prescribed an antibiotic medicine, take it as told by your doctor. Do not stop taking the antibiotic even if you start to feel better. Hydrate and Humidify  Drink enough water to keep your pee (urine) clear or pale yellow.  Use a cool mist humidifier to keep the humidity level in your home above 50%.  Breathe in steam for 10-15 minutes, 3-4 times a day or as told by your doctor. You can do this in the bathroom while a hot shower is running.  Try not to spend time in cool or dry air. Rest  Rest as much as possible.  Sleep with your head raised (elevated).  Make sure to get enough sleep  each night. General instructions  Put a warm, moist washcloth on your face 3-4 times a day or as told by your doctor. This will help with discomfort.  Wash your hands often with soap and water. If there is no soap and water, use hand sanitizer.  Do not smoke. Avoid being around people who are smoking (secondhand smoke).  Keep all follow-up visits as told by your doctor. This is important. Contact a doctor if:  You have a fever.  Your symptoms get worse.  Your symptoms do not get better within 10 days. Get help right away if:  You have a very bad headache.  You cannot stop throwing up (vomiting).  You have pain or swelling around your face or eyes.  You have trouble seeing.  You feel confused.  Your neck is stiff.  You have trouble breathing. This information is not intended to replace advice given to you by your health care provider. Make sure you discuss any questions you have with your health care provider. Document Released: 11/23/2007 Document Revised: 01/31/2016 Document Reviewed: 04/01/2015 Elsevier Interactive Patient Education  [employer].

## 2017-06-20 NOTE — L&D Delivery Note (Addendum)
Delivery Note Pt with bradycardia to 50's.  Noted to be C/C/+2-3 - pushed and vaccuum assisted for delivery with initially with [facility], but too uncomfortable for pt, then proceeded with [facility] after brief discussion of r/b/a.  2 pop-offs, 7-8 pulls. Intermittent FHT monitored 120-150, mother had pulse ox on.   At 2:31 AM a viable female was delivered via Vaginal, Vacuum (Extractor) (Presentation: OA; LOT).  APGAR: 0,0;2 weight P  .   Placenta status: manually extracted.  Cord: 3V with the following complications: none.  Cord pH: 7.11  Anesthesia: epidural  Episiotomy:  none Lacerations: 2nd degree Suture Repair: 3.0 vicryl rapide Est. Blood Loss (mL): 200cc  Mom to postpartum.  Baby to NICU.  Patricia Berry 10/04/2017, 3:15 AM  B+/RI/Tdap in PNC/Contra?/Br

## 2017-06-30 ENCOUNTER — Ambulatory Visit: Payer: BC Managed Care – PPO | Admitting: Family Medicine

## 2017-08-03 ENCOUNTER — Inpatient Hospital Stay (HOSPITAL_COMMUNITY): Payer: BC Managed Care – PPO

## 2017-08-03 ENCOUNTER — Encounter (HOSPITAL_COMMUNITY): Payer: Self-pay | Admitting: *Deleted

## 2017-08-03 ENCOUNTER — Inpatient Hospital Stay (HOSPITAL_COMMUNITY)
Admission: AD | Admit: 2017-08-03 | Discharge: 2017-08-05 | DRG: 818 | Disposition: A | Discharge status: Home / Self Care | Payer: BC Managed Care – PPO | Source: Ambulatory Visit | Attending: Obstetrics and Gynecology | Admitting: Obstetrics and Gynecology

## 2017-08-03 DIAGNOSIS — O26899 Other specified pregnancy related conditions, unspecified trimester: Secondary | ICD-10-CM | POA: Diagnosis present

## 2017-08-03 DIAGNOSIS — K3580 Unspecified acute appendicitis: Secondary | ICD-10-CM | POA: Diagnosis present

## 2017-08-03 DIAGNOSIS — O9989 Other specified diseases and conditions complicating pregnancy, childbirth and the puerperium: Secondary | ICD-10-CM

## 2017-08-03 DIAGNOSIS — R1011 Right upper quadrant pain: Secondary | ICD-10-CM | POA: Diagnosis not present

## 2017-08-03 DIAGNOSIS — O99613 Diseases of the digestive system complicating pregnancy, third trimester: Secondary | ICD-10-CM | POA: Diagnosis not present

## 2017-08-03 DIAGNOSIS — O99213 Obesity complicating pregnancy, third trimester: Secondary | ICD-10-CM | POA: Diagnosis present

## 2017-08-03 DIAGNOSIS — Z3A28 28 weeks gestation of pregnancy: Secondary | ICD-10-CM

## 2017-08-03 DIAGNOSIS — R109 Unspecified abdominal pain: Secondary | ICD-10-CM | POA: Diagnosis present

## 2017-08-03 LAB — URINALYSIS, ROUTINE W REFLEX MICROSCOPIC
BILIRUBIN URINE: NEGATIVE
GLUCOSE, UA: NEGATIVE mg/dL
Hgb urine dipstick: NEGATIVE
KETONES UR: 80 mg/dL — AB
Leukocytes, UA: NEGATIVE
Nitrite: NEGATIVE
PH: 6 (ref 5.0–8.0)
Protein, ur: NEGATIVE mg/dL
SPECIFIC GRAVITY, URINE: 1.025 (ref 1.005–1.030)

## 2017-08-03 LAB — CBC WITH DIFFERENTIAL/PLATELET
BASOS PCT: 0 %
Basophils Absolute: 0 10*3/uL (ref 0.0–0.1)
EOS ABS: 0 10*3/uL (ref 0.0–0.7)
Eosinophils Relative: 0 %
HEMATOCRIT: 36.5 % (ref 36.0–46.0)
HEMOGLOBIN: 12.5 g/dL (ref 12.0–15.0)
LYMPHS ABS: 1.3 10*3/uL (ref 0.7–4.0)
Lymphocytes Relative: 8 %
MCH: 28.5 pg (ref 26.0–34.0)
MCHC: 34.2 g/dL (ref 30.0–36.0)
MCV: 83.1 fL (ref 78.0–100.0)
MONO ABS: 0.2 10*3/uL (ref 0.1–1.0)
MONOS PCT: 1 %
NEUTROS ABS: 14.8 10*3/uL — AB (ref 1.7–7.7)
Neutrophils Relative %: 91 %
Platelets: 321 10*3/uL (ref 150–400)
RBC: 4.39 MIL/uL (ref 3.87–5.11)
RDW: 14.3 % (ref 11.5–15.5)
WBC: 16.2 10*3/uL — ABNORMAL HIGH (ref 4.0–10.5)

## 2017-08-03 LAB — COMPREHENSIVE METABOLIC PANEL
ALBUMIN: 3.2 g/dL — AB (ref 3.5–5.0)
ALK PHOS: 75 U/L (ref 38–126)
ALT: 17 U/L (ref 14–54)
ANION GAP: 9 (ref 5–15)
AST: 16 U/L (ref 15–41)
BILIRUBIN TOTAL: 0.7 mg/dL (ref 0.3–1.2)
BUN: 9 mg/dL (ref 6–20)
CALCIUM: 8.7 mg/dL — AB (ref 8.9–10.3)
CO2: 21 mmol/L — ABNORMAL LOW (ref 22–32)
Chloride: 103 mmol/L (ref 101–111)
Creatinine, Ser: 0.54 mg/dL (ref 0.44–1.00)
GFR calc non Af Amer: >60 mL/min (ref >60)
GLUCOSE: 124 mg/dL — AB (ref 65–99)
POTASSIUM: 3.9 mmol/L (ref 3.5–5.1)
Sodium: 133 mmol/L — ABNORMAL LOW (ref 135–145)
TOTAL PROTEIN: 7.1 g/dL (ref 6.5–8.1)

## 2017-08-03 LAB — LIPASE, BLOOD: Lipase: 25 U/L (ref 11–51)

## 2017-08-03 LAB — AMYLASE: Amylase: 50 U/L (ref 28–100)

## 2017-08-03 MED ORDER — IBUPROFEN 600 MG PO TABS
600.0000 mg | ORAL_TABLET | Freq: Once | ORAL | Status: AC
  Administered 2017-08-04: 600 mg via ORAL
  Filled 2017-08-03: qty 1

## 2017-08-03 MED ORDER — SODIUM CHLORIDE 0.9 % IV SOLN
INTRAVENOUS | Status: DC
  Administered 2017-08-03 – 2017-08-04 (×2): via INTRAVENOUS

## 2017-08-03 NOTE — MAU Provider Note (Signed)
Chief Complaint:  Emesis   First Provider Initiated Contact with Patient 08/03/17 2204     HPI: Patricia Berry is a 32 y.o. G1P0000 at 13w4dho presents to maternity admissions reporting onset of upper abdominal pain at noon.  Then started having vomiting and diarrhea several hours later.  Thinks the pain caused the vomiting.  No fever.  Tried Tums and Zantac with no relief. . She reports good fetal movement, denies LOF, vaginal bleeding, vaginal itching/burning, urinary symptoms, h/a, dizziness, constipation or fever/chills.    Emesis   This is a new problem. The current episode started today. The problem occurs 5 to 10 times per day. The problem has been unchanged. There has been no fever. Associated symptoms include abdominal pain and diarrhea. Pertinent negatives include no chills, coughing, dizziness, fever, headaches or myalgias. Treatments tried: TUMS, zantac. The treatment provided no relief.  Abdominal Pain  This is a new problem. The current episode started today. The problem occurs constantly. The problem has been unchanged. The pain is located in the epigastric region. The quality of the pain is cramping, sharp and burning. Associated symptoms include diarrhea, nausea and vomiting. Pertinent negatives include no constipation, dysuria, fever, frequency, headaches, hematuria or myalgias. The pain is aggravated by palpation and movement. The pain is relieved by nothing.    RN note: PT SAYS SHE STARTED VOMITING AT 4 PM.   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   UNSURE IF FEVER AT HOME.  NO COUGH / SORE THROAT    HAS PAIN IN UPPER ABD   SINCE 1230 NOON- WENT  TO DR TODAY - TOLD TO TAKE ZANTAC- TOOK AT 5PM-   VOMITED AT 6PM      CALLED DR AGAIN- TOOK 2 TUMS   AT 6PM .  PAIN FEELS WORSE.-   THINKS  VOMITNG BC OF PAIN.       LAST ATE AT 630PM- CRACKERS.      Past Medical History: No past medical history on file.  Past obstetric history: OB History  Gravida Para Term  Preterm AB Living  1 0 0 0 0 0  SAB TAB Ectopic Multiple Live Births  0 0 0 0 0    # Outcome Date GA Lbr Len/2nd Weight Sex Delivery Anes PTL Lv  1 Current               Past Surgical History: Past Surgical History:  Procedure Laterality Date  . NO PAST SURGERIES      Family History: Family History  Problem Relation Age of Onset  . Hyperlipidemia Mother   . Mental illness Father   . Cancer Maternal Aunt        small cell  . Heart disease Maternal Grandmother        heart attack  . Early death Maternal Grandmother   . Prostate cancer Maternal Grandfather   . Mental illness Paternal Grandmother     Social History: Social History   Tobacco Use  . Smoking status: Never Smoker  . Smokeless tobacco: Never Used  Substance Use Topics  . Alcohol use: Yes    Alcohol/week: 1.2 oz    Types: 2 Glasses of wine per week  . Drug use: No    Allergies: No Known Allergies  Meds:  Medications Prior to Admission  Medication Sig Dispense Refill Last Dose  . amoxicillin-clavulanate (AUGMENTIN) 875-125 MG tablet Take 1 tablet by mouth 2 (two) times daily.  20 tablet 0   . valACYclovir (VALTREX) 1000 MG tablet 2g once then repeat dose in 12 hours once at onset of cold sore (Patient not taking: Reported on 05/31/2017) 20 tablet 2 Not Taking    I have reviewed patient's Past Medical Hx, Surgical Hx, Family Hx, Social Hx, medications and allergies.   ROS:  Review of Systems  Constitutional: Negative for chills and fever.  Respiratory: Negative for cough.   Gastrointestinal: Positive for abdominal pain, diarrhea, nausea and vomiting. Negative for constipation.  Genitourinary: Negative for dysuria, frequency and hematuria.  Musculoskeletal: Negative for myalgias.  Neurological: Negative for dizziness and headaches.   Other systems negative  Physical Exam   Patient Vitals for the past 24 hrs:  BP Temp Temp src Pulse Resp Height Weight  08/03/17 2200 121/78 97.7 F (36.5 C) Oral  93 20 5' 4"  (1.626 m) (!) 327 lb 8 oz (148.6 kg)   Constitutional: Well-developed, well-nourished female in no acute distress, but uncomfortable.  Cardiovascular: normal rate and rhythm Respiratory: normal effort, clear to auscultation bilaterally GI: Abd soft, diffusely tender, gravid appropriate for gestational age.   No rebound but does have slight guarding. MS: Extremities nontender, no edema, normal ROM Neurologic: Alert and oriented x 4.  GU: Neg CVAT.  PELVIC EXAM:  deferred   FHT:  Baseline 140 , moderate variability, accelerations present, no decelerations  Difficult to trace due to habitus Contractions: Irritability   Labs: Results for orders placed or performed during the hospital encounter of 08/03/17 (from the past 24 hour(s))  Urinalysis, Routine w reflex microscopic     Status: Abnormal   Collection Time: 08/03/17 10:00 PM  Result Value Ref Range   Color, Urine YELLOW YELLOW   APPearance CLEAR CLEAR   Specific Gravity, Urine 1.025 1.005 - 1.030   pH 6.0 5.0 - 8.0   Glucose, UA NEGATIVE NEGATIVE mg/dL   Hgb urine dipstick NEGATIVE NEGATIVE   Bilirubin Urine NEGATIVE NEGATIVE   Ketones, ur 80 (A) NEGATIVE mg/dL   Protein, ur NEGATIVE NEGATIVE mg/dL   Nitrite NEGATIVE NEGATIVE   Leukocytes, UA NEGATIVE NEGATIVE  CBC with Differential/Platelet     Status: Abnormal   Collection Time: 08/03/17 10:11 PM  Result Value Ref Range   WBC 16.2 (H) 4.0 - 10.5 K/uL   RBC 4.39 3.87 - 5.11 MIL/uL   Hemoglobin 12.5 12.0 - 15.0 g/dL   HCT 36.5 36.0 - 46.0 %   MCV 83.1 78.0 - 100.0 fL   MCH 28.5 26.0 - 34.0 pg   MCHC 34.2 30.0 - 36.0 g/dL   RDW 14.3 11.5 - 15.5 %   Platelets 321 150 - 400 K/uL   Neutrophils Relative % 91 %   Neutro Abs 14.8 (H) 1.7 - 7.7 K/uL   Lymphocytes Relative 8 %   Lymphs Abs 1.3 0.7 - 4.0 K/uL   Monocytes Relative 1 %   Monocytes Absolute 0.2 0.1 - 1.0 K/uL   Eosinophils Relative 0 %   Eosinophils Absolute 0.0 0.0 - 0.7 K/uL   Basophils Relative 0  %   Basophils Absolute 0.0 0.0 - 0.1 K/uL  Comprehensive metabolic panel     Status: Abnormal   Collection Time: 08/03/17 10:11 PM  Result Value Ref Range   Sodium 133 (L) 135 - 145 mmol/L   Potassium 3.9 3.5 - 5.1 mmol/L   Chloride 103 101 - 111 mmol/L   CO2 21 (L) 22 - 32 mmol/L   Glucose, Bld 124 (H) 65 - 99  mg/dL   BUN 9 6 - 20 mg/dL   Creatinine, Ser 0.54 0.44 - 1.00 mg/dL   Calcium 8.7 (L) 8.9 - 10.3 mg/dL   Total Protein 7.1 6.5 - 8.1 g/dL   Albumin 3.2 (L) 3.5 - 5.0 g/dL   AST 16 15 - 41 U/L   ALT 17 14 - 54 U/L   Alkaline Phosphatase 75 38 - 126 U/L   Total Bilirubin 0.7 0.3 - 1.2 mg/dL   GFR calc non Af Amer >60 >60 mL/min   GFR calc Af Amer >60 >60 mL/min   Anion gap 9 5 - 15  Amylase     Status: None   Collection Time: 08/03/17 10:11 PM  Result Value Ref Range   Amylase 50 28 - 100 U/L  Lipase, blood     Status: None   Collection Time: 08/03/17 10:11 PM  Result Value Ref Range   Lipase 25 11 - 51 U/L       Imaging:  US Abdomen Limited Ruq  Result Date: 08/03/2017 CLINICAL DATA:  RIGHT upper quadrant pain. Leukocytosis and vomiting. Twenty-eight weeks pregnant. EXAM: ULTRASOUND ABDOMEN LIMITED RIGHT UPPER QUADRANT COMPARISON:  None. FINDINGS: Gallbladder: No gallstones or wall thickening visualized. No sonographic Murphy sign noted by sonographer. Common bile duct: Diameter: 2 mm Liver: No focal lesion identified. Within normal limits in parenchymal echogenicity. Portal vein is patent on color Doppler imaging with normal direction of blood flow towards the liver. IMPRESSION: Negative RIGHT upper quadrant ultrasound. Electronically Signed   By: Elon Alas M.D.   On: 08/03/2017 23:54    MAU Course/MDM: I have ordered labs and reviewed results. WBC elevated but other labs normal  NST reviewed, reassuring for GA [place] negative for RUQ pathology  Consult Dr Terri Piedra with presentation, exam findings and test results.  Treatments in MAU included IV fluids, Ibuprofen  which did not give relief.  Continues to have vomiting Will admit for observation.    Assessment: Single IUP at 39w5dAbdominal pain Nausea, vomiting and diarrhea Leukocytosis  Plan: Admit for observation MD to follow   MHansel FeinsteinCNM, MSN Certified Nurse-Midwife 08/03/2017 10:05 PM

## 2017-08-03 NOTE — MAU Note (Addendum)
PT SAYS SHE STARTED VOMITING AT 4 PM.   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   UNSURE IF FEVER AT HOME.  NO COUGH / SORE THROAT    HAS PAIN IN UPPER ABD   SINCE 1230 NOON- WENT  TO DR TODAY - TOLD TO TAKE ZANTAC- TOOK AT 5PM-   VOMITED AT 6PM      CALLED DR AGAIN- TOOK 2 TUMS   AT 6PM .  PAIN FEELS WORSE.-   THINKS  VOMITNG BC OF PAIN.       LAST ATE AT 630PM- CRACKERS.

## 2017-08-04 ENCOUNTER — Other Ambulatory Visit (HOSPITAL_COMMUNITY): Payer: BC Managed Care – PPO

## 2017-08-04 ENCOUNTER — Inpatient Hospital Stay (HOSPITAL_COMMUNITY): Payer: BC Managed Care – PPO | Admitting: Certified Registered Nurse Anesthetist

## 2017-08-04 ENCOUNTER — Other Ambulatory Visit: Payer: Self-pay

## 2017-08-04 ENCOUNTER — Encounter (HOSPITAL_COMMUNITY): Payer: Self-pay | Admitting: Advanced Practice Midwife

## 2017-08-04 ENCOUNTER — Observation Stay (HOSPITAL_COMMUNITY)
Admit: 2017-08-04 | Discharge: 2017-08-04 | Disposition: A | Discharge status: Home / Self Care | Payer: BC Managed Care – PPO | Attending: Physician Assistant | Admitting: Physician Assistant

## 2017-08-04 ENCOUNTER — Inpatient Hospital Stay: Admit: 2017-08-04 | Payer: BC Managed Care – PPO | Admitting: Surgery

## 2017-08-04 ENCOUNTER — Encounter (HOSPITAL_COMMUNITY)
Admission: AD | Disposition: A | Discharge status: Home / Self Care | Payer: Self-pay | Source: Ambulatory Visit | Attending: Obstetrics and Gynecology

## 2017-08-04 DIAGNOSIS — Z3A28 28 weeks gestation of pregnancy: Secondary | ICD-10-CM | POA: Diagnosis not present

## 2017-08-04 DIAGNOSIS — R109 Unspecified abdominal pain: Secondary | ICD-10-CM | POA: Diagnosis present

## 2017-08-04 DIAGNOSIS — O99613 Diseases of the digestive system complicating pregnancy, third trimester: Secondary | ICD-10-CM | POA: Diagnosis present

## 2017-08-04 DIAGNOSIS — R1011 Right upper quadrant pain: Secondary | ICD-10-CM | POA: Diagnosis present

## 2017-08-04 DIAGNOSIS — O4440 Low lying placenta NOS or without hemorrhage, unspecified trimester: Secondary | ICD-10-CM | POA: Insufficient documentation

## 2017-08-04 DIAGNOSIS — O26899 Other specified pregnancy related conditions, unspecified trimester: Secondary | ICD-10-CM | POA: Diagnosis present

## 2017-08-04 DIAGNOSIS — K3580 Unspecified acute appendicitis: Secondary | ICD-10-CM | POA: Diagnosis present

## 2017-08-04 DIAGNOSIS — O99213 Obesity complicating pregnancy, third trimester: Secondary | ICD-10-CM | POA: Diagnosis present

## 2017-08-04 HISTORY — PX: LAPAROSCOPIC APPENDECTOMY: SHX408

## 2017-08-04 LAB — CBC WITH DIFFERENTIAL/PLATELET
BASOS PCT: 0 %
Basophils Absolute: 0 10*3/uL (ref 0.0–0.1)
EOS ABS: 0 10*3/uL (ref 0.0–0.7)
EOS PCT: 0 %
HCT: 34 % — ABNORMAL LOW (ref 36.0–46.0)
HEMOGLOBIN: 11.4 g/dL — AB (ref 12.0–15.0)
Lymphocytes Relative: 14 %
Lymphs Abs: 2.1 10*3/uL (ref 0.7–4.0)
MCH: 27.8 pg (ref 26.0–34.0)
MCHC: 33.5 g/dL (ref 30.0–36.0)
MCV: 82.9 fL (ref 78.0–100.0)
MONO ABS: 0.5 10*3/uL (ref 0.1–1.0)
MONOS PCT: 3 %
NEUTROS PCT: 83 %
Neutro Abs: 13.2 10*3/uL — ABNORMAL HIGH (ref 1.7–7.7)
Platelets: 303 10*3/uL (ref 150–400)
RBC: 4.1 MIL/uL (ref 3.87–5.11)
RDW: 14.4 % (ref 11.5–15.5)
WBC: 15.8 10*3/uL — ABNORMAL HIGH (ref 4.0–10.5)

## 2017-08-04 LAB — COMPREHENSIVE METABOLIC PANEL
ALBUMIN: 2.6 g/dL — AB (ref 3.5–5.0)
ALT: 16 U/L (ref 14–54)
ANION GAP: 8 (ref 5–15)
AST: 13 U/L — ABNORMAL LOW (ref 15–41)
Alkaline Phosphatase: 65 U/L (ref 38–126)
BUN: 7 mg/dL (ref 6–20)
CO2: 21 mmol/L — AB (ref 22–32)
Calcium: 8.5 mg/dL — ABNORMAL LOW (ref 8.9–10.3)
Chloride: 104 mmol/L (ref 101–111)
Creatinine, Ser: 0.46 mg/dL (ref 0.44–1.00)
GFR calc Af Amer: >60 mL/min (ref >60)
GFR calc non Af Amer: >60 mL/min (ref >60)
GLUCOSE: 97 mg/dL (ref 65–99)
POTASSIUM: 3.7 mmol/L (ref 3.5–5.1)
SODIUM: 133 mmol/L — AB (ref 135–145)
Total Bilirubin: 0.7 mg/dL (ref 0.3–1.2)
Total Protein: 5.5 g/dL — ABNORMAL LOW (ref 6.5–8.1)

## 2017-08-04 LAB — AMYLASE: Amylase: 43 U/L (ref 28–100)

## 2017-08-04 LAB — LIPASE, BLOOD: LIPASE: 26 U/L (ref 11–51)

## 2017-08-04 SURGERY — APPENDECTOMY, LAPAROSCOPIC
Anesthesia: General

## 2017-08-04 MED ORDER — MEPERIDINE HCL 50 MG/ML IJ SOLN: 6.2500 mg | INTRAMUSCULAR | Status: DC | PRN

## 2017-08-04 MED ORDER — CALCIUM CARBONATE ANTACID 500 MG PO CHEW: 2.0000 | CHEWABLE_TABLET | ORAL | Status: DC | PRN

## 2017-08-04 MED ORDER — LACTATED RINGERS IV SOLN
INTRAVENOUS | Status: DC | PRN
  Administered 2017-08-04 (×2): via INTRAVENOUS

## 2017-08-04 MED ORDER — FENTANYL CITRATE (PF) 100 MCG/2ML IJ SOLN
INTRAMUSCULAR | Status: AC
  Filled 2017-08-04: qty 2

## 2017-08-04 MED ORDER — PROPOFOL 10 MG/ML IV BOLUS
INTRAVENOUS | Status: AC
  Filled 2017-08-04: qty 20

## 2017-08-04 MED ORDER — CYCLOBENZAPRINE HCL 10 MG PO TABS
10.0000 mg | ORAL_TABLET | Freq: Three times a day (TID) | ORAL | Status: DC | PRN
  Administered 2017-08-04: 10 mg via ORAL
  Filled 2017-08-04 (×2): qty 1

## 2017-08-04 MED ORDER — PHENYLEPHRINE 40 MCG/ML (10ML) SYRINGE FOR IV PUSH (FOR BLOOD PRESSURE SUPPORT)
PREFILLED_SYRINGE | INTRAVENOUS | Status: DC | PRN
  Administered 2017-08-04: 80 ug via INTRAVENOUS
  Administered 2017-08-04: 120 ug via INTRAVENOUS
  Administered 2017-08-04: 40 ug via INTRAVENOUS

## 2017-08-04 MED ORDER — LACTATED RINGERS IR SOLN
Status: DC | PRN
  Administered 2017-08-04: 1000 mL

## 2017-08-04 MED ORDER — BUPIVACAINE-EPINEPHRINE (PF) 0.5% -1:200000 IJ SOLN
INTRAMUSCULAR | Status: DC | PRN
  Administered 2017-08-04: 30 mL

## 2017-08-04 MED ORDER — ONDANSETRON HCL 4 MG/2ML IJ SOLN
INTRAMUSCULAR | Status: AC
  Filled 2017-08-04: qty 2

## 2017-08-04 MED ORDER — PROPOFOL 10 MG/ML IV BOLUS
INTRAVENOUS | Status: DC | PRN
  Administered 2017-08-04: 200 mg via INTRAVENOUS

## 2017-08-04 MED ORDER — ROCURONIUM BROMIDE 10 MG/ML (PF) SYRINGE
PREFILLED_SYRINGE | INTRAVENOUS | Status: AC
  Filled 2017-08-04: qty 5

## 2017-08-04 MED ORDER — PHENYLEPHRINE 40 MCG/ML (10ML) SYRINGE FOR IV PUSH (FOR BLOOD PRESSURE SUPPORT)
PREFILLED_SYRINGE | INTRAVENOUS | Status: AC
  Filled 2017-08-04: qty 10

## 2017-08-04 MED ORDER — ACETAMINOPHEN 325 MG PO TABS: 325.0000 mg | ORAL_TABLET | ORAL | Status: DC | PRN

## 2017-08-04 MED ORDER — FENTANYL CITRATE (PF) 250 MCG/5ML IJ SOLN
INTRAMUSCULAR | Status: AC
  Filled 2017-08-04: qty 5

## 2017-08-04 MED ORDER — PROMETHAZINE HCL 25 MG/ML IJ SOLN
12.5000 mg | Freq: Four times a day (QID) | INTRAMUSCULAR | Status: DC | PRN
  Administered 2017-08-04 (×2): 25 mg via INTRAVENOUS
  Filled 2017-08-04 (×2): qty 1

## 2017-08-04 MED ORDER — SUCCINYLCHOLINE CHLORIDE 200 MG/10ML IV SOSY
PREFILLED_SYRINGE | INTRAVENOUS | Status: DC | PRN
  Administered 2017-08-04: 140 mg via INTRAVENOUS

## 2017-08-04 MED ORDER — ZOLPIDEM TARTRATE 5 MG PO TABS
5.0000 mg | ORAL_TABLET | Freq: Every evening | ORAL | Status: DC | PRN
  Administered 2017-08-04: 5 mg via ORAL
  Filled 2017-08-04: qty 1

## 2017-08-04 MED ORDER — FAMOTIDINE IN NACL 20-0.9 MG/50ML-% IV SOLN
20.0000 mg | Freq: Two times a day (BID) | INTRAVENOUS | Status: DC
  Administered 2017-08-04 (×2): 20 mg via INTRAVENOUS
  Filled 2017-08-04 (×3): qty 50

## 2017-08-04 MED ORDER — FAMOTIDINE 20 MG PO TABS
20.0000 mg | ORAL_TABLET | Freq: Two times a day (BID) | ORAL | Status: DC
  Administered 2017-08-05: 20 mg via ORAL
  Filled 2017-08-04: qty 1

## 2017-08-04 MED ORDER — OXYCODONE HCL 5 MG/5ML PO SOLN
5.0000 mg | Freq: Once | ORAL | Status: DC | PRN
  Filled 2017-08-04: qty 5

## 2017-08-04 MED ORDER — ACETAMINOPHEN-CODEINE #3 300-30 MG PO TABS
1.0000 | ORAL_TABLET | ORAL | Status: DC | PRN
  Administered 2017-08-04 – 2017-08-05 (×2): 1 via ORAL
  Filled 2017-08-04 (×2): qty 1

## 2017-08-04 MED ORDER — MORPHINE SULFATE (PF) 4 MG/ML IV SOLN
1.0000 mg | INTRAVENOUS | Status: DC | PRN
  Administered 2017-08-05: 2 mg via INTRAVENOUS
  Filled 2017-08-04: qty 1

## 2017-08-04 MED ORDER — BUPIVACAINE-EPINEPHRINE 0.5% -1:200000 IJ SOLN
INTRAMUSCULAR | Status: AC
  Filled 2017-08-04: qty 1

## 2017-08-04 MED ORDER — ROCURONIUM BROMIDE 50 MG/5ML IV SOSY
PREFILLED_SYRINGE | INTRAVENOUS | Status: DC | PRN
  Administered 2017-08-04: 5 mg via INTRAVENOUS
  Administered 2017-08-04: 35 mg via INTRAVENOUS

## 2017-08-04 MED ORDER — BUPIVACAINE HCL (PF) 0.25 % IJ SOLN
INTRAMUSCULAR | Status: AC
  Filled 2017-08-04: qty 30

## 2017-08-04 MED ORDER — DOCUSATE SODIUM 100 MG PO CAPS
100.0000 mg | ORAL_CAPSULE | Freq: Every day | ORAL | Status: DC
  Administered 2017-08-05: 100 mg via ORAL
  Filled 2017-08-04: qty 1

## 2017-08-04 MED ORDER — LIDOCAINE 2% (20 MG/ML) 5 ML SYRINGE
INTRAMUSCULAR | Status: DC | PRN
  Administered 2017-08-04: 80 mg via INTRAVENOUS

## 2017-08-04 MED ORDER — ACETAMINOPHEN 325 MG PO TABS: 650.0000 mg | ORAL_TABLET | ORAL | Status: DC | PRN

## 2017-08-04 MED ORDER — PRENATAL MULTIVITAMIN CH
1.0000 | ORAL_TABLET | Freq: Every day | ORAL | Status: DC
  Administered 2017-08-05: 1 via ORAL
  Filled 2017-08-04: qty 1

## 2017-08-04 MED ORDER — PROMETHAZINE HCL 25 MG PO TABS: 25.0000 mg | ORAL_TABLET | Freq: Four times a day (QID) | ORAL | Status: DC | PRN

## 2017-08-04 MED ORDER — KETOROLAC TROMETHAMINE 30 MG/ML IJ SOLN: 30.0000 mg | Freq: Once | INTRAMUSCULAR | Status: DC | PRN

## 2017-08-04 MED ORDER — AMPICILLIN-SULBACTAM SODIUM 3 (2-1) G IJ SOLR
3.0000 g | Freq: Four times a day (QID) | INTRAMUSCULAR | Status: DC
  Administered 2017-08-04 – 2017-08-05 (×5): 3 g via INTRAVENOUS
  Filled 2017-08-04 (×8): qty 3

## 2017-08-04 MED ORDER — FENTANYL CITRATE (PF) 100 MCG/2ML IJ SOLN
INTRAMUSCULAR | Status: DC | PRN
  Administered 2017-08-04 (×5): 50 ug via INTRAVENOUS

## 2017-08-04 MED ORDER — LIDOCAINE 2% (20 MG/ML) 5 ML SYRINGE
INTRAMUSCULAR | Status: AC
  Filled 2017-08-04: qty 5

## 2017-08-04 MED ORDER — ACETAMINOPHEN 160 MG/5ML PO SOLN: 325.0000 mg | ORAL | Status: DC | PRN

## 2017-08-04 MED ORDER — NEOSTIGMINE METHYLSULFATE 10 MG/10ML IV SOLN
INTRAVENOUS | Status: DC | PRN
  Administered 2017-08-04: 5 mg via INTRAVENOUS

## 2017-08-04 MED ORDER — SODIUM CHLORIDE 0.9 % IV SOLN
INTRAVENOUS | Status: DC
  Administered 2017-08-04 – 2017-08-05 (×3): via INTRAVENOUS

## 2017-08-04 MED ORDER — SUCCINYLCHOLINE CHLORIDE 200 MG/10ML IV SOSY
PREFILLED_SYRINGE | INTRAVENOUS | Status: AC
  Filled 2017-08-04: qty 10

## 2017-08-04 MED ORDER — FENTANYL CITRATE (PF) 100 MCG/2ML IJ SOLN
25.0000 ug | INTRAMUSCULAR | Status: DC | PRN
  Administered 2017-08-04 (×3): 25 ug via INTRAVENOUS

## 2017-08-04 MED ORDER — GLYCOPYRROLATE 0.2 MG/ML IV SOSY
PREFILLED_SYRINGE | INTRAVENOUS | Status: DC | PRN
  Administered 2017-08-04: 0.6 mg via INTRAVENOUS

## 2017-08-04 MED ORDER — NEOSTIGMINE METHYLSULFATE 5 MG/5ML IV SOSY
PREFILLED_SYRINGE | INTRAVENOUS | Status: AC
  Filled 2017-08-04: qty 5

## 2017-08-04 MED ORDER — ONDANSETRON HCL 4 MG/2ML IJ SOLN: 4.0000 mg | Freq: Once | INTRAMUSCULAR | Status: DC | PRN

## 2017-08-04 MED ORDER — HYDROCODONE-ACETAMINOPHEN 5-325 MG PO TABS
1.0000 | ORAL_TABLET | ORAL | Status: DC | PRN
  Administered 2017-08-05: 2 via ORAL
  Filled 2017-08-04: qty 2

## 2017-08-04 MED ORDER — ONDANSETRON HCL 4 MG/2ML IJ SOLN
INTRAMUSCULAR | Status: DC | PRN
  Administered 2017-08-04: 4 mg via INTRAVENOUS

## 2017-08-04 MED ORDER — GLYCOPYRROLATE 0.2 MG/ML IV SOSY
PREFILLED_SYRINGE | INTRAVENOUS | Status: AC
  Filled 2017-08-04: qty 5

## 2017-08-04 MED ORDER — OXYCODONE HCL 5 MG PO TABS: 5.0000 mg | ORAL_TABLET | Freq: Once | ORAL | Status: DC | PRN

## 2017-08-04 SURGICAL SUPPLY — 34 items
APPLIER CLIP ROT 10 11.4 M/L (STAPLE)
BENZOIN TINCTURE PRP APPL 2/3 (GAUZE/BANDAGES/DRESSINGS) IMPLANT
CABLE HIGH FREQUENCY MONO STRZ (ELECTRODE) ×2 IMPLANT
CHLORAPREP W/TINT 26ML (MISCELLANEOUS) ×2 IMPLANT
CLIP APPLIE ROT 10 11.4 M/L (STAPLE) IMPLANT
COVER SURGICAL LIGHT HANDLE (MISCELLANEOUS) ×2 IMPLANT
CUTTER FLEX LINEAR 45M (STAPLE) ×2 IMPLANT
DECANTER SPIKE VIAL GLASS SM (MISCELLANEOUS) ×2 IMPLANT
DERMABOND ADVANCED (GAUZE/BANDAGES/DRESSINGS) ×1
DERMABOND ADVANCED .7 DNX12 (GAUZE/BANDAGES/DRESSINGS) ×1 IMPLANT
DRAPE LAPAROSCOPIC ABDOMINAL (DRAPES) ×2 IMPLANT
ELECT REM PT RETURN 15FT ADLT (MISCELLANEOUS) ×2 IMPLANT
ENDOLOOP SUT PDS II  0 18 (SUTURE)
ENDOLOOP SUT PDS II 0 18 (SUTURE) IMPLANT
GLOVE SURG SIGNA 7.5 PF LTX (GLOVE) ×2 IMPLANT
GOWN STRL REUS W/TWL XL LVL3 (GOWN DISPOSABLE) ×4 IMPLANT
KIT BASIN OR (CUSTOM PROCEDURE TRAY) ×2 IMPLANT
NEEDLE SPNL 18GX3.5 QUINCKE PK (NEEDLE) ×2 IMPLANT
POUCH SPECIMEN RETRIEVAL 10MM (ENDOMECHANICALS) ×2 IMPLANT
RELOAD 45 VASCULAR/THIN (ENDOMECHANICALS) IMPLANT
RELOAD STAPLE TA45 3.5 REG BLU (ENDOMECHANICALS) ×2 IMPLANT
SCISSORS LAP 5X35 DISP (ENDOMECHANICALS) ×2 IMPLANT
SET IRRIG TUBING LAPAROSCOPIC (IRRIGATION / IRRIGATOR) ×2 IMPLANT
SHEARS HARMONIC ACE PLUS 36CM (ENDOMECHANICALS) ×2 IMPLANT
SLEEVE XCEL OPT CAN 5 100 (ENDOMECHANICALS) ×2 IMPLANT
STAPLER VISISTAT (STAPLE) ×2 IMPLANT
STRIP CLOSURE SKIN 1/2X4 (GAUZE/BANDAGES/DRESSINGS) IMPLANT
SUT MNCRL AB 4-0 PS2 18 (SUTURE) ×2 IMPLANT
SUT VIC AB 2-0 SH 18 (SUTURE) IMPLANT
TOWEL OR 17X26 10 PK STRL BLUE (TOWEL DISPOSABLE) ×2 IMPLANT
TOWEL OR NON WOVEN STRL DISP B (DISPOSABLE) ×2 IMPLANT
TRAY LAPAROSCOPIC (CUSTOM PROCEDURE TRAY) ×2 IMPLANT
TROCAR BLADELESS OPT 5 100 (ENDOMECHANICALS) ×2 IMPLANT
TROCAR XCEL BLUNT TIP 100MML (ENDOMECHANICALS) ×2 IMPLANT

## 2017-08-04 NOTE — Anesthesia Postprocedure Evaluation (Signed)
Anesthesia Post Note  Patient: Arboriculturist  Procedure(s) Performed: APPENDECTOMY LAPAROSCOPIC (N/A )     Patient location during evaluation: PACU Anesthesia Type: General Level of consciousness: awake and alert Pain management: pain level controlled Vital Signs Assessment: post-procedure vital signs reviewed and stable Respiratory status: spontaneous breathing, nonlabored ventilation, respiratory function stable and patient connected to nasal cannula oxygen Cardiovascular status: blood pressure returned to baseline and stable Postop Assessment: no apparent nausea or vomiting Anesthetic complications: no    Last Vitals:  Vitals:   08/04/17 2129 08/04/17 2130  BP:  (!) 145/83  Pulse: 76 70  Resp: (!) 26 (!) 24  Temp:    SpO2: 98% 98%    Last Pain:  Vitals:   08/04/17 2129  TempSrc:   PainSc: 5                  Lamario Mani

## 2017-08-04 NOTE — Progress Notes (Signed)
Patient ID: Patricia Berry, female   DOB: 1985/07/25, 32 y.o.   MRN: [id number] Pt reports "severe" pain across entire abdomen and worse when moves. Vomited twice this am. No fever or chills and no diarrhea since last night. States tylenol not helping pain  Per nursing pt slept through the night and has slept most of morning after phenergan given after vomited once  VSS: afeb, nl BP and HR EFM - baseline 150s, variability appropriate for GA TOCO - no contractions SVE - deferred   15.8>11.4<303, nuetrophils down to 83 from 91  LFT nl and stable, amylase 43 ( from 50) and lipase unchanged  RUQ U/S wnl last night   A/P: Prime at 28 5/7wks with diffuse abdominal pain                 Diff includes acute appendicitis, acute viral gastritis  Will start on empiric antibiotics, continue iv hydration, add tylenol with codeine for pain control,  Monitor I/Os, recheck labs in am     Obtain general surgery consult Plan reviewed with pt and FOB

## 2017-08-04 NOTE — Progress Notes (Signed)
Notified Dr Melba Coon of patient arrival back to [facility], given update. FHR=145 via doppler upon admission. Per Dr Melba Coon additional EFM does not need to be completed tonight since strip was done by RROB postoperatively unless the patient is anxious and request it.

## 2017-08-04 NOTE — H&P (Addendum)
Patricia Berry is a 32 y.o. female prime at 35 weeks presenting for with complaint of upper abdominal pain beginning at noon yesterday. She was seen in the office and advised to try reflux medication given had eaten an hour prior to symptoms, was asymptomatic and had reassuring fetal assessment. Pain persisted despite trying recommendations hence pt went to [facility] also as advised. On arrival she noted had begun to have vomiting and diarrhea later in day. Pt is a Education officer, museum. Pt got no relief of pain with ibuprofen and was noted to have an elevated white count with shift but a neg RUQ scan for pathology. Fetal assessment remained reassuring. Pt has had a relatively benign prenatal course complicated only by obesity  OB History    Gravida Para Term Preterm AB Living   1 0 0 0 0 0   SAB TAB Ectopic Multiple Live Births   0 0 0 0 0     History reviewed. No pertinent past medical history. Past Surgical History:  Procedure Laterality Date  . NO PAST SURGERIES     Family History: family history includes Cancer in her maternal aunt; Early death in her maternal grandmother; Heart disease in her maternal grandmother; Hyperlipidemia in her mother; Mental illness in her father and paternal grandmother; Prostate cancer in her maternal grandfather. Social History:  reports that  has never smoked. she has never used smokeless tobacco. She reports that she drinks about 1.2 oz of alcohol per week. She reports that she does not use drugs.     Maternal Diabetes: No Genetic Screening: Normal Maternal Ultrasounds/Referrals: Normal Fetal Ultrasounds or other Referrals:  None Maternal Substance Abuse:  No Significant Maternal Medications:  None Significant Maternal Lab Results:  None Other Comments:  None  Review of Systems  Constitutional: Positive for malaise/fatigue. Negative for chills and fever.  Eyes: Negative for blurred vision.  Respiratory: Negative for shortness of breath.   Cardiovascular:  Negative for chest pain.  Gastrointestinal: Positive for abdominal pain, diarrhea, nausea and vomiting. Negative for heartburn.  Genitourinary: Negative for dysuria.  Musculoskeletal: Negative for back pain and myalgias.  Skin: Negative for itching and rash.  Neurological: Negative for dizziness and headaches.  Endo/Heme/Allergies: Does not bruise/bleed easily.  Psychiatric/Behavioral: Negative for depression, hallucinations, substance abuse and suicidal ideas. The patient is nervous/anxious.    Maternal Medical History:  Reason for admission: Nausea.   Fetal activity: Perceived fetal activity is normal.   Last perceived fetal movement was within the past hour.    Prenatal Complications - Diabetes: none.      Blood pressure 117/74, pulse 81, temperature 97.7 F (36.5 C), temperature source Oral, resp. rate 18, height 5' 4"  (1.626 m), weight (!) 327 lb 8 oz (148.6 kg), last menstrual period 01/08/2017, SpO2 94 %. Maternal Exam:  Abdomen: Patient reports the following abdominal tenderness: LUQ and RUQ.  Estimated fetal weight is AGA.       Physical Exam  Constitutional: She is oriented to person, place, and time. She appears well-nourished.  Neck: Normal range of motion.  Cardiovascular: Normal rate.  Respiratory: Effort normal.  GI: Soft. Bowel sounds are normal. She exhibits no distension. There is tenderness in the right upper quadrant and left upper quadrant. There is guarding. There is no rebound.  Musculoskeletal: Normal range of motion.  Neurological: She is alert and oriented to person, place, and time.  Skin: Skin is warm.  Psychiatric: She has a normal mood and affect. Her behavior is normal. Judgment and thought  content normal.    Prenatal labs: ABO, Rh:   Antibody:   Rubella:   RPR:    HBsAg:    HIV:    GBS:     Assessment/Plan: Prime at 28 weeks with diffuse left and right upper quadrant pain Differential includes acute gastritis, appendicitis, GERD,  musculoskeletal pain, bile sludge Admitted for observation overnight, hydration and pain control Will recheck labs in am and reassess condition    Venetia Night Patricia Berry 08/04/2017, 6:12 AM

## 2017-08-04 NOTE — Progress Notes (Addendum)
Patient ID: Patricia Berry, female   DOB: [birth year], 32 y.o.   MRN: [id number]   MRI reveals likely appendicitis.   Call to [facility] - will take to OR for laparoscopic appy.  Will come and talk to her to [facility] for removal

## 2017-08-04 NOTE — Progress Notes (Signed)
This note also relates to the following rows which could not be included: Pulse Rate - Cannot attach notes to unvalidated device data Resp - Cannot attach notes to unvalidated device data SpO2 - Cannot attach notes to unvalidated device data  Pt will be transferred back to [facility] via [facility]; will be on the HROB/OB Special Care unit where she was before being transferred to [facility] for surgery.

## 2017-08-04 NOTE — Progress Notes (Signed)
Patient was educated to use the bathroom and pee in the hat in the toilet. Patient preceded to move the hat because she didn't want to pee on it. Reeducated the patient to pee in the hat next time and patient understood.

## 2017-08-04 NOTE — Transfer of Care (Signed)
Immediate Anesthesia Transfer of Care Note  Patient: Patricia Berry  Procedure(s) Performed: APPENDECTOMY LAPAROSCOPIC (N/A )  Patient Location: PACU  Anesthesia Type:General  Level of Consciousness: awake, alert , oriented and patient cooperative  Airway & Oxygen Therapy: Patient Spontanous Breathing and Patient connected to face mask  Post-op Assessment: Report given to RN and Post -op Vital signs reviewed and stable  Post vital signs: Reviewed and stable  Last Vitals:  Vitals:   08/04/17 1819 08/04/17 1832  BP: 124/86   Pulse: 91   Resp: 20   Temp:  (!) 36.2 C  SpO2:  96%    Last Pain:  Vitals:   08/04/17 1832  TempSrc: Oral  PainSc:       Patients Stated Pain Goal: 2 (30/10/40 4591)  Complications: No apparent anesthesia complications

## 2017-08-04 NOTE — Progress Notes (Signed)
This note also relates to the following rows which could not be included: Pulse Rate - Cannot attach notes to unvalidated device data Resp - Cannot attach notes to unvalidated device data SpO2 - Cannot attach notes to unvalidated device data  NST complete; fhr within normal limits, ave variability, 10x10 and 15x15 accels, no decelerations. Maternal heart rate different from fetal heart rate at all times; pt on continuous pulse ox.... Pt has no complaints of pain related to pregnancy/only pain related to surgery.

## 2017-08-04 NOTE — Op Note (Signed)
Re:   Patricia Berry DOB:   03/08/1986 MRN:   [id number]                   FACILITY:  [facility]  DATE OF PROCEDURE: 08/04/2017                              OPERATIVE REPORT  PREOPERATIVE DIAGNOSIS:  Appendicitis.   [redacted] weeks pregnant.  POSTOPERATIVE DIAGNOSIS:  Acute suppurative appendicitis.  [redacted] weeks pregnant.  PROCEDURE:  Laparoscopic appendectomy.  SURGEON:  Fenton Malling. Lucia Gaskins, MD  ASSISTANT:  No first assistant.  ANESTHESIA:  General endotracheal.  Anesthesiologist: Janeece Riggers, MD CRNA: Claudia Desanctis, CRNA; Montel Clock, CRNA  ASA:  3E  ESTIMATED BLOOD LOSS:  Minimal.  DRAINS: none   SPECIMEN:   Appendix  COUNTS CORRECT:  YES  INDICATIONS FOR PROCEDURE: Patricia Berry is a 32 y.o. (DOB: 12/17/1985) white female whose primary care doctor is Kuneff, Renee A, DO and comes to the OR for an appendectomy.  She is [redacted] weeks pregnant.  I have spoken to Dr. Sandford Craze about a plan.  I will transfer and do her surgery at [facility].  If the surgery goes well, I will transfer her back to [facility] for fetal observation.  I discussed with the patient, the indications and potential complications of appendiceal surgery.  The potential complications include, but are not limited to, bleeding, open surgery, bowel resection, and the possibility of another diagnosis.  OPERATIVE NOTE:  The patient underwent a general endotracheal anesthetic as supervised by Anesthesiologist: Janeece Riggers, MD CRNA: Claudia Desanctis, CRNA; Montel Clock, CRNA, General, in [place] room #1 at [facility].  The patient was on Unasyn prior to the beginning of the procedure and the abdomen was prepped with ChloraPrep.   A time-out was held and surgical checklist run.  An infraumbilical incision was made with sharp dissection carried down to the abdominal cavity.  An 12 mm Hasson trocar was inserted through the infraumbilical incision and into the peritoneal cavity.  A 30 degree 5 mm  laparoscope was inserted through a 12 mm Hasson trocar and the Hasson trocar secured with a 0 Vicryl suture.  I placed a 5 mm trocar in the right upper quadrant and a 11 mm torcar in upper mid abdomen and did abdominal exploration.    The right and left lobes of liver unremarkable.  Stomach was unremarkable.  I could see the gravid uterus and the right ovary and tube, but the gravid uterus took up the lower half of the abdominal cavity.  I saw no other intra-abdominal abnormality.  The patient had appendicitis with the appendix located right pelvic brim.  It was covered in pus and purulent.  The appendix was not ruptured.  The mesentery of the appendix was divided with a Harmonic scalpel.  I got to the base of the appendix.  I then used a blue load 45 mm Ethicon Endo-GIA stapler and fired this across the base of the appendix.  I placed the appendix in EndoCatch bag and delivered the bag through the umbilical incision.  I irrigated the abdomen with 500 cc of saline.  After irrigating the abdomen, I then removed the trocars, in turn.  The umbilical port fascia was closed with 0 Vicryl suture.   I closed the skin each site with a 4-0 Monocryl suture and painted the wounds with DermaBond.  I then injected a  total of 30 mL of 0.25% Marcaine at the incisions.  Sponge and needle count were correct at the end of the case.  The patient was transferred to the recovery room in good condition.  The patient tolerated the procedure well and it depends on the patient's post op clinical course as to when the patient could be discharged.   Alphonsa Overall, MD, [facility] Surgery Pager: [phone] Office phone:  [phone]

## 2017-08-04 NOTE — Progress Notes (Signed)
RROB to [facility] PACU to monitor fetal heart rate post-op appendectomy. Hard to get a continuous tracing due to maternal habitus; RN adjusting monitor and holding monitor in place

## 2017-08-04 NOTE — Progress Notes (Signed)
PACU Nsg Note: Spoke with OB RRT RN at [facility], report received. [facility] informed to bring pt to PACU.

## 2017-08-04 NOTE — Consult Note (Signed)
[facility] Surgery Consult Note  Patricia Berry [birth year]  [id number].    Requesting MD: Carlynn Purl Chief Complaint/Reason for Consult: abdominal pain  HPI:  Patricia Berry is a 32yo female 75 5/[redacted] weeks pregnant, who was admitted to [facility] hospital yesterday with diffuse abdominal pain. States that the pain began around 1230 yesterday. Initially it was in her upper abdomen, now she is having diffuse pain. Pain is constant and severe. Worse with movement or palpation. Associated with nausea, vomiting, and diarrhea. No fever or chills. Denies dysuria.  Lab work showed leukocytosis WBC 15.8, LFTs WNL, lipase WNL. RUQ u/s showed no gallstones of wall thickening. General surgery asked to consult for possible appendicitis.  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   HENT: Negative.   Eyes: Negative.   Respiratory: Negative.   Cardiovascular: Negative.   Gastrointestinal: Positive for abdominal pain, diarrhea, heartburn, nausea and vomiting.  Genitourinary: Negative.   Musculoskeletal: Negative.   Skin: Negative.   Neurological: Negative.     All systems reviewed and otherwise negative except for as above  Family History  Problem Relation Age of Onset  . Hyperlipidemia Mother   . Mental illness Father   . Cancer Maternal Aunt        small cell  . Heart disease Maternal Grandmother        heart attack  . Early death Maternal Grandmother   . Prostate cancer Maternal Grandfather   . Mental illness Paternal Grandmother     History reviewed. No pertinent past medical history.  Past Surgical History:  Procedure Laterality Date  . NO PAST SURGERIES      Social History:  reports that  has never smoked. she has never used smokeless  tobacco. She reports that she drinks about 1.2 oz of alcohol per week. She reports that she does not use drugs.  Allergies: No Known Allergies  Medications Prior to Admission  Medication Sig Dispense Refill  . Prenatal Vit-Fe Fumarate-FA (PRENATAL MULTIVITAMIN) TABS tablet Take 1 tablet by mouth daily at 12 noon.    Marland Kitchen amoxicillin-clavulanate (AUGMENTIN) 875-125 MG tablet Take 1 tablet by mouth 2 (two) times daily. (Patient not taking: Reported on 08/04/2017) 20 tablet 0  . valACYclovir (VALTREX) 1000 MG tablet 2g once then repeat dose in 12 hours once at onset of cold sore (Patient not taking: Reported on 05/31/2017) 20 tablet 2    Prior to Admission medications   Medication Sig Start Date End Date Taking? Authorizing Provider  Prenatal Vit-Fe Fumarate-FA (PRENATAL MULTIVITAMIN) TABS tablet Take 1 tablet by mouth daily at 12 noon.   Yes [provider]  amoxicillin-clavulanate (AUGMENTIN) 875-125 MG tablet Take 1 tablet by mouth 2 (two) times daily. Patient not taking: Reported on 08/04/2017 05/31/17   Howard Pouch A, DO  valACYclovir (VALTREX) 1000 MG tablet 2g once then repeat dose in 12 hours once at onset of cold sore Patient not taking: Reported on 05/31/2017 01/30/17   Howard Pouch A, DO    Blood pressure 122/77, pulse 90, temperature 98.9 F (37.2 C), temperature source Oral, resp. rate 18, height 5' 4"  (1.626 m), weight (!) 148.6 kg (327 lb 8 oz), last menstrual period 01/08/2017, SpO2 97 %. Physical Exam: General: pleasant, WD/WN white female who is laying in bed in NAD HEENT: head is normocephalic,  atraumatic.  Sclera are noninjected.  Pupils equal and round.  Ears and nose without any masses or lesions.  Mouth is pink and moist. Dentition fair Heart: regular, rate, and rhythm.  No obvious murmurs, gallops, or rubs noted.  Palpable pedal pulses bilaterally Lungs: CTAB, no wheezes, rhonchi, or rales noted.  Respiratory effort nonlabored Abd: obese, soft, +BS, no masses,  hernias, or organomegaly. Diffuse tenderness without rebound or guarding, RLQ slightly more tender than LLQ MS: all 4 extremities are symmetrical with no cyanosis, clubbing, or edema. Skin: warm and dry with no masses, lesions, or rashes Psych: A&Ox3 with an appropriate affect. Neuro: cranial nerves grossly intact, extremity CSM intact bilaterally, normal speech  Results for orders placed or performed during the hospital encounter of 08/03/17 (from the past 48 hour(s))  Urinalysis, Routine w reflex microscopic     Status: Abnormal   Collection Time: 08/03/17 10:00 PM  Result Value Ref Range   Color, Urine YELLOW YELLOW   APPearance CLEAR CLEAR   Specific Gravity, Urine 1.025 1.005 - 1.030   pH 6.0 5.0 - 8.0   Glucose, UA NEGATIVE NEGATIVE mg/dL   Hgb urine dipstick NEGATIVE NEGATIVE   Bilirubin Urine NEGATIVE NEGATIVE   Ketones, ur 80 (A) NEGATIVE mg/dL   Protein, ur NEGATIVE NEGATIVE mg/dL   Nitrite NEGATIVE NEGATIVE   Leukocytes, UA NEGATIVE NEGATIVE    Comment: Performed at [facility], [address], [place], [place] [address]  CBC with Differential/Platelet     Status: Abnormal   Collection Time: 08/03/17 10:11 PM  Result Value Ref Range   WBC 16.2 (H) 4.0 - 10.5 K/uL   RBC 4.39 3.87 - 5.11 MIL/uL   Hemoglobin 12.5 12.0 - 15.0 g/dL   HCT 36.5 36.0 - 46.0 %   MCV 83.1 78.0 - 100.0 fL   MCH 28.5 26.0 - 34.0 pg   MCHC 34.2 30.0 - 36.0 g/dL   RDW 14.3 11.5 - 15.5 %   Platelets 321 150 - 400 K/uL   Neutrophils Relative % 91 %   Neutro Abs 14.8 (H) 1.7 - 7.7 K/uL   Lymphocytes Relative 8 %   Lymphs Abs 1.3 0.7 - 4.0 K/uL   Monocytes Relative 1 %   Monocytes Absolute 0.2 0.1 - 1.0 K/uL   Eosinophils Relative 0 %   Eosinophils Absolute 0.0 0.0 - 0.7 K/uL   Basophils Relative 0 %   Basophils Absolute 0.0 0.0 - 0.1 K/uL    Comment: Performed at [facility], [address], [place], [place] [address]  Comprehensive metabolic panel     Status: Abnormal   Collection  Time: 08/03/17 10:11 PM  Result Value Ref Range   Sodium 133 (L) 135 - 145 mmol/L   Potassium 3.9 3.5 - 5.1 mmol/L   Chloride 103 101 - 111 mmol/L   CO2 21 (L) 22 - 32 mmol/L   Glucose, Bld 124 (H) 65 - 99 mg/dL   BUN 9 6 - 20 mg/dL   Creatinine, Ser 0.54 0.44 - 1.00 mg/dL   Calcium 8.7 (L) 8.9 - 10.3 mg/dL   Total Protein 7.1 6.5 - 8.1 g/dL   Albumin 3.2 (L) 3.5 - 5.0 g/dL   AST 16 15 - 41 U/L   ALT 17 14 - 54 U/L   Alkaline Phosphatase 75 38 - 126 U/L   Total Bilirubin 0.7 0.3 - 1.2 mg/dL   GFR calc non Af Amer >60 >60 mL/min   GFR calc Af Amer >60 >60 mL/min  Comment: (NOTE) The eGFR has been calculated using the CKD EPI equation. This calculation has not been validated in all clinical situations. eGFR's persistently <60 mL/min signify possible Chronic Kidney Disease.    Anion gap 9 5 - 15    Comment: Performed at [facility], [address] [place], [place] [address]  Amylase     Status: None   Collection Time: 08/03/17 10:11 PM  Result Value Ref Range   Amylase 50 28 - 100 U/L    Comment: Performed at [facility], [address] [place], [place] [address]  Lipase, blood     Status: None   Collection Time: 08/03/17 10:11 PM  Result Value Ref Range   Lipase 25 11 - 51 U/L    Comment: Performed at [facility], [address] [place], [place] [address]  CBC with Differential/Platelet     Status: Abnormal   Collection Time: 08/04/17  6:58 AM  Result Value Ref Range   WBC 15.8 (H) 4.0 - 10.5 K/uL   RBC 4.10 3.87 - 5.11 MIL/uL   Hemoglobin 11.4 (L) 12.0 - 15.0 g/dL   HCT 34.0 (L) 36.0 - 46.0 %   MCV 82.9 78.0 - 100.0 fL   MCH 27.8 26.0 - 34.0 pg   MCHC 33.5 30.0 - 36.0 g/dL   RDW 14.4 11.5 - 15.5 %   Platelets 303 150 - 400 K/uL   Neutrophils Relative % 83 %   Neutro Abs 13.2 (H) 1.7 - 7.7 K/uL   Lymphocytes Relative 14 %   Lymphs Abs 2.1 0.7 - 4.0 K/uL   Monocytes Relative 3 %   Monocytes Absolute 0.5 0.1 - 1.0 K/uL   Eosinophils Relative 0 %    Eosinophils Absolute 0.0 0.0 - 0.7 K/uL   Basophils Relative 0 %   Basophils Absolute 0.0 0.0 - 0.1 K/uL    Comment: Performed at [facility], [address], [place], [place] [address]  Comprehensive metabolic panel     Status: Abnormal   Collection Time: 08/04/17  6:58 AM  Result Value Ref Range   Sodium 133 (L) 135 - 145 mmol/L   Potassium 3.7 3.5 - 5.1 mmol/L   Chloride 104 101 - 111 mmol/L   CO2 21 (L) 22 - 32 mmol/L   Glucose, Bld 97 65 - 99 mg/dL   BUN 7 6 - 20 mg/dL   Creatinine, Ser 0.46 0.44 - 1.00 mg/dL   Calcium 8.5 (L) 8.9 - 10.3 mg/dL   Total Protein 5.5 (L) 6.5 - 8.1 g/dL   Albumin 2.6 (L) 3.5 - 5.0 g/dL   AST 13 (L) 15 - 41 U/L   ALT 16 14 - 54 U/L   Alkaline Phosphatase 65 38 - 126 U/L   Total Bilirubin 0.7 0.3 - 1.2 mg/dL   GFR calc non Af Amer >60 >60 mL/min   GFR calc Af Amer >60 >60 mL/min    Comment: (NOTE) The eGFR has been calculated using the CKD EPI equation. This calculation has not been validated in all clinical situations. eGFR's persistently <60 mL/min signify possible Chronic Kidney Disease.    Anion gap 8 5 - 15    Comment: Performed at [facility], [address], [place], [place] [address]  Amylase     Status: None   Collection Time: 08/04/17  6:58 AM  Result Value Ref Range   Amylase 43 28 - 100 U/L    Comment: Performed at [facility], [address], [place], [place] [address]  Lipase, blood     Status: None   Collection Time: 08/04/17  6:58 AM  Result Value Ref Range   Lipase 26 11 - 51 U/L    Comment: Performed at [facility], [address], [place], [place] [address]   US Abdomen Limited Ruq  Result Date: 08/03/2017 CLINICAL DATA:  RIGHT upper quadrant pain. Leukocytosis and vomiting. Twenty-eight weeks pregnant. EXAM: ULTRASOUND ABDOMEN LIMITED RIGHT UPPER QUADRANT COMPARISON:  None. FINDINGS: Gallbladder: No gallstones or wall thickening visualized. No sonographic Murphy sign noted by sonographer. Common  bile duct: Diameter: 2 mm Liver: No focal lesion identified. Within normal limits in parenchymal echogenicity. Portal vein is patent on color Doppler imaging with normal direction of blood flow towards the liver. IMPRESSION: Negative RIGHT upper quadrant ultrasound. Electronically Signed   By: Elon Alas M.D.   On: 08/03/2017 23:54   Anti-infectives (From admission, onward)   Start     Dose/Rate Route Frequency Ordered Stop   08/04/17 1230  Ampicillin-Sulbactam (UNASYN) 3 g in sodium chloride 0.9 % 100 mL IVPB     3 g 200 mL/hr over 30 Minutes Intravenous Every 6 hours 08/04/17 1220          Assessment/Plan 28 5/[redacted] weeks pregnant Diffuse abdominal pain Nausea, vomiting Leukocytosis - Will obtain MRI of the abdomen to evaluate for possible appendicitis. Continue IVF, empiric IV antibiotics, and NPO for now.  ID - unasyn 2/15>> VTE - per primary FEN - IVF, NPO Foley - none Follow up - TBD  Wellington Hampshire, [facility] Surgery 08/04/2017, 12:29 PM Pager: [phone] Consults: [phone]  Agree with above.  Her MRI suggests appendicitis.  This matches her picture clinically.  She is morbidly obese and the PE is difficult to discern much.  She is tender.  I discussed with the patient the indications and risks of appendiceal surgery.  The primary risks of appendiceal surgery include, but are not limited to, bleeding, infection, bowel surgery, and open surgery.  There is also the risk that the patient may have continued symptoms after surgery.  We discussed the typical post-operative recovery course. I tried to answer the patient's questions.  There is also a risk of inducing the pregnancy and having an early delivery.  But the saftest is to go ahead with surgery.  Her husband is at the bedside and was part of the discussion. 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  Alphonsa Overall, MD, [facility] Surgery Pager: [phone] Office phone:   [phone]

## 2017-08-04 NOTE — Anesthesia Procedure Notes (Addendum)
Procedure Name: Intubation Date/Time: 08/04/2017 7:38 PM Performed by: Montel Clock, CRNA Pre-anesthesia Checklist: Patient identified, Emergency Drugs available, Suction available, Patient being monitored and Timeout performed Patient Re-evaluated:Patient Re-evaluated prior to induction Oxygen Delivery Method: Circle system utilized Preoxygenation: Pre-oxygenation with 100% oxygen Induction Type: IV induction, Rapid sequence and Cricoid Pressure applied Laryngoscope Size: 3 and Glidescope Grade View: Grade I Tube type: Oral Tube size: 7.0 mm Number of attempts: 1 Airway Equipment and Method: Video-laryngoscopy and Rigid stylet Placement Confirmation: ETT inserted through vocal cords under direct vision,  positive ETCO2 and breath sounds checked- equal and bilateral Secured at: 22 cm Tube secured with: Tape Dental Injury: Teeth and Oropharynx as per pre-operative assessment

## 2017-08-04 NOTE — Progress Notes (Signed)
1800- [facility] notified that pt needing transportation to [facility] for surgical procedure, [facility] dispatch stating that it will prob be 30 min to get transport to [facility] to transport. [id number], RN at [place] stay at [facility] notified of timeline per [place].

## 2017-08-04 NOTE — Progress Notes (Signed)
ANTIBIOTIC CONSULT NOTE - INITIAL  Pharmacy Consult for Unasyn Indication: Empiric coverage for possible intra-abdominal infection  No Known Allergies  Patient Measurements: Height: 5' 4"  (162.6 cm) Weight: (!) 327 lb 8 oz (148.6 kg) IBW/kg (Calculated) : 54.7  Vital Signs: Temp: 98.9 F (37.2 C) (02/15 1201) Temp Source: Oral (02/15 1201) BP: 122/77 (02/15 1201) Pulse Rate: 90 (02/15 1201)  Labs: Recent Labs    08/03/17 2211 08/04/17 0658  WBC 16.2* 15.8*  HGB 12.5 11.4*  PLT 321 303  CREATININE 0.54 0.46    Microbiology: No results found for this or any previous visit (from the past 720 hour(s)).  Assessment: 32 y.o. female G1P0000 at 92w5dadmitted with severe abdominal pain over entire abdomen.  Vomited twice, no fever or chills.  Differential dx is appendicitis or acute viral gastritis.  Obtaining surgical consult.  Start empiric antibiotics  Plan:  Unasyn 3 grams IV every 6 hours.    MJoetta MannersD 08/04/2017,12:24 PM

## 2017-08-04 NOTE — Anesthesia Preprocedure Evaluation (Addendum)
Anesthesia Evaluation  Patient identified by MRN, date of birth, ID band Patient awake    Reviewed: Allergy & Precautions, H&P , NPO status , Patient's Chart, lab work & pertinent test results, reviewed documented beta blocker date and time   Airway Mallampati: IV  TM Distance: >3 FB Neck ROM: full    Dental no notable dental hx. (+) Teeth Intact   Pulmonary neg pulmonary ROS,    Pulmonary exam normal breath sounds clear to auscultation       Cardiovascular Exercise Tolerance: Good negative cardio ROS Normal cardiovascular exam Rhythm:regular Rate:Normal     Neuro/Psych negative neurological ROS  negative psych ROS   GI/Hepatic negative GI ROS, Neg liver ROS,   Endo/Other  negative endocrine ROSMorbid obesity  Renal/GU negative Renal ROS  negative genitourinary   Musculoskeletal   Abdominal   Peds  Hematology negative hematology ROS (+)   Anesthesia Other Findings   Reproductive/Obstetrics negative OB ROS (+) Pregnancy                            Anesthesia Physical Anesthesia Plan  ASA: III and emergent  Anesthesia Plan: General   Post-op Pain Management:    Induction: Cricoid pressure planned  PONV Risk Score and Plan: 3 and Treatment may vary due to age or medical condition  Airway Management Planned: Oral ETT and Video Laryngoscope Planned  Additional Equipment:   Intra-op Plan:   Post-operative Plan: Extubation in OR  Informed Consent: I have reviewed the patients History and Physical, chart, labs and discussed the procedure including the risks, benefits and alternatives for the proposed anesthesia with the patient or authorized representative who has indicated his/her understanding and acceptance.   Dental Advisory Given  Plan Discussed with: CRNA and Anesthesiologist  Anesthesia Plan Comments: (  )       Anesthesia Quick Evaluation

## 2017-08-05 ENCOUNTER — Encounter (HOSPITAL_COMMUNITY): Payer: Self-pay | Admitting: Surgery

## 2017-08-05 MED ORDER — AMOXICILLIN-POT CLAVULANATE 875-125 MG PO TABS: 1.0000 | ORAL_TABLET | Freq: Two times a day (BID) | ORAL | 0 refills | Status: AC

## 2017-08-05 MED ORDER — HYDROCODONE-ACETAMINOPHEN 5-325 MG PO TABS: 1.0000 | ORAL_TABLET | Freq: Four times a day (QID) | ORAL | 0 refills | Status: DC | PRN

## 2017-08-05 NOTE — Progress Notes (Signed)
Patient ID: Kimyah Frein, female   DOB: [birth year], 32 y.o.   MRN: [id number]   28+6 - s/p appendectomy 2/15 - laparoscopic  No c/o's.  Some pain.  +FM, no LOF, no VB, no ctx;  Overall abd pain improved, but sore  AFVSS  gen NAD FHTs 120-150 by doppler/NST CV RRR Lungs CTAB Abd soft, app tender, + quiet bs Ext SCDs in place Inc C/D/I  28+6 s/p appy Treat for total of 5 days with Augmentin Appreciate surgery's input and treatment Likely d/c this PM or tomorrow Incentive spitometry Advance diet

## 2017-08-05 NOTE — Discharge Summary (Signed)
OB Discharge Summary     Patient Name: Patricia Berry DOB: 11-24-1985 MRN: [id number]  Date of admission: 08/03/2017   Date of discharge: 08/05/2017  Admitting diagnosis: 11WKS ABD PAIN Intrauterine pregnancy: [redacted]w[redacted]d    Secondary diagnosis:  Active Problems:   Abdominal pain affecting pregnancy  Additional problems: appendicitis     Discharge diagnosis: 28+ week pregnant, s/p laparoscopic appendectomy                                                                                                Post partum procedures:N/A  Augmentation: N/A  Complications: none  Hospital course:Admitted with abdominal pain.  Also N/V.  MRI reveals likely appendicitis/  Laparoscopic appendectomy by Dr NLucia Gaskins  D/C to home POD #1  Physical exam  Vitals:   08/05/17 0930 08/05/17 1030 08/05/17 1200 08/05/17 1558  BP:   (!) 102/57 117/71  Pulse:   73 77  Resp:   20 19  Temp:   98.1 F (36.7 C) 97.9 F (36.6 C)  TempSrc:   Oral Oral  SpO2: 93% 93% 94% 96%  Weight:      Height:       General: alert and no distress Abdomen: soft, gravid, + BS Incision: Healing well with no significant drainage  Labs: Lab Results  Component Value Date   WBC 15.8 (H) 08/04/2017   HGB 11.4 (L) 08/04/2017   HCT 34.0 (L) 08/04/2017   MCV 82.9 08/04/2017   PLT 303 08/04/2017   CMP Latest Ref Rng & Units 08/04/2017  Glucose 65 - 99 mg/dL 97  BUN 6 - 20 mg/dL 7  Creatinine 0.44 - 1.00 mg/dL 0.46  Sodium 135 - 145 mmol/L 133(L)  Potassium 3.5 - 5.1 mmol/L 3.7  Chloride 101 - 111 mmol/L 104  CO2 22 - 32 mmol/L 21(L)  Calcium 8.9 - 10.3 mg/dL 8.5(L)  Total Protein 6.5 - 8.1 g/dL 5.5(L)  Total Bilirubin 0.3 - 1.2 mg/dL 0.7  Alkaline Phos 38 - 126 U/L 65  AST 15 - 41 U/L 13(L)  ALT 14 - 54 U/L 16    Discharge instruction: per After Visit Summary and "Baby and Me Booklet".  After visit meds:  Allergies as of 08/05/2017   No Known Allergies     Medication List    TAKE these medications    amoxicillin-clavulanate 875-125 MG tablet Commonly known as:  AUGMENTIN Take 1 tablet by mouth 2 (two) times daily for 4 days.   HYDROcodone-acetaminophen 5-325 MG tablet Commonly known as:  NORCO/VICODIN Take 1-2 tablets by mouth every 6 (six) hours as needed for moderate pain.   prenatal multivitamin Tabs tablet Take 1 tablet by mouth daily at 12 noon.   valACYclovir 1000 MG tablet Commonly known as:  VALTREX 2g once then repeat dose in 12 hours once at onset of cold sore       Diet: routine diet  Activity: Advance as tolerated. No heavy lifting  [facility] follow up[facility]t:No future appointments. Follow up Visit:No Follow-up on file.   08/05/2017  Janyth Contes, MD

## 2017-08-05 NOTE — Progress Notes (Signed)
[place] [facility]:  [phone] General Surgery Progress Note   LOS: 1 day  POD -  1 Day Post-Op  Chief Complaint: Abdominal pain  Assessment and Plan: 1.  APPENDECTOMY LAPAROSCOPIC - 08/04/2017 - D. Duane Earnshaw  On Unasyn.  To advance to reg diet for lunch  Probably discharge later today - continue antibiotics (Augmentin) for a total of 5 days from surgery.  See Dr. Lucia Gaskins in the office in 2 to 3 weeks. 2.  [redacted] weeks pregnant 3.  Morbid obesity   Active Problems:   Abdominal pain affecting pregnancy   Subjective:  Doing well.  Tolerated liquids.  Expects to go home later today.  Objective:   Vitals:   08/05/17 1030 08/05/17 1200  BP:  (!) 102/57  Pulse:  73  Resp:  20  Temp:  98.1 F (36.7 C)  SpO2: 93% 94%     Intake/Output from previous day:  02/15 0701 - 02/16 0700 In: 1700 [I.V.:1600; IV Piggyback:100] Out: 2080 [Urine:1975; Emesis/NG output:100; Blood:5]  Intake/Output this shift:  Total I/O In: -  Out: 500 [Urine:500]   Physical Exam:   General: Obese WF who is alert and oriented.    HEENT: Normal. Pupils equal. .   Lungs: Clear   Abdomen: Soft.  Has a few BS.   Wound: Clean   Lab Results:    Recent Labs    08/03/17 2211 08/04/17 0658  WBC 16.2* 15.8*  HGB 12.5 11.4*  HCT 36.5 34.0*  PLT 321 303    BMET   Recent Labs    08/03/17 2211 08/04/17 0658  NA 133* 133*  K 3.9 3.7  CL 103 104  CO2 21* 21*  GLUCOSE 124* 97  BUN 9 7  CREATININE 0.54 0.46  CALCIUM 8.7* 8.5*    PT/INR  No results for input(s): LABPROT, INR in the last 72 hours.  ABG  No results for input(s): PHART, HCO3 in the last 72 hours.  Invalid input(s): PCO2, PO2   Studies/Results:  Mr Pelvis Wo Contrast  Result Date: 08/04/2017 CLINICAL DATA:  Pregnant with abdominal pain. EXAM: MRI ABDOMEN AND PELVIS WITHOUT CONTRAST TECHNIQUE: Multiplanar multisequence MR imaging of the abdomen and pelvis was performed. No intravenous contrast was administered.  COMPARISON:  None. FINDINGS: COMBINED FINDINGS FOR BOTH MR ABDOMEN AND PELVIS Lower chest: The lung bases are grossly clear. Hepatobiliary: No hepatic lesions. The gallbladder is mildly distended but no gallstones. Normal caliber common bile duct. Pancreas:  No mass, inflammation or ductal dilatation. Spleen:  Normal size. No focal lesions. Adrenals/Urinary Tract: The adrenal glands and kidneys are unremarkable. No significant hydronephrosis. The ureters are normal in caliber. The bladder is normal. Stomach/Bowel: The stomach, duodenum, small bowel and colon are grossly normal. There is a right lower quadrant inflammatory process. The appendix appears to be dilated and I suspect there is a proximal appendicolith. Findings highly suspicious for acute appendicitis. Vascular/Lymphatic: The major vascular structures appear normal. No mesenteric or retroperitoneal mass or adenopathy. Reproductive: Gravid uterus. The fetus is head down. No gross abnormalities. Normal appearing amniotic fluid volume. Anterior placenta appears normal. Other:  No ascites or abdominal wall hernia. Musculoskeletal: No significant bony findings. IMPRESSION: 1. MR findings highly suspicious for acute appendicitis. 2. Gravid uterus.  No worrisome MR imaging findings. These results will be called to the ordering clinician or representative by the Radiologist Assistant, and communication documented in the PACS or zVision Dashboard. Electronically Signed   By: Marijo Sanes M.D.   On: 08/04/2017  17:09   Mr Abdomen Wo Contrast  Result Date: 08/04/2017 CLINICAL DATA:  Pregnant with abdominal pain. EXAM: MRI ABDOMEN AND PELVIS WITHOUT CONTRAST TECHNIQUE: Multiplanar multisequence MR imaging of the abdomen and pelvis was performed. No intravenous contrast was administered. COMPARISON:  None. FINDINGS: COMBINED FINDINGS FOR BOTH MR ABDOMEN AND PELVIS Lower chest: The lung bases are grossly clear. Hepatobiliary: No hepatic lesions. The gallbladder is  mildly distended but no gallstones. Normal caliber common bile duct. Pancreas:  No mass, inflammation or ductal dilatation. Spleen:  Normal size. No focal lesions. Adrenals/Urinary Tract: The adrenal glands and kidneys are unremarkable. No significant hydronephrosis. The ureters are normal in caliber. The bladder is normal. Stomach/Bowel: The stomach, duodenum, small bowel and colon are grossly normal. There is a right lower quadrant inflammatory process. The appendix appears to be dilated and I suspect there is a proximal appendicolith. Findings highly suspicious for acute appendicitis. Vascular/Lymphatic: The major vascular structures appear normal. No mesenteric or retroperitoneal mass or adenopathy. Reproductive: Gravid uterus. The fetus is head down. No gross abnormalities. Normal appearing amniotic fluid volume. Anterior placenta appears normal. Other:  No ascites or abdominal wall hernia. Musculoskeletal: No significant bony findings. IMPRESSION: 1. MR findings highly suspicious for acute appendicitis. 2. Gravid uterus.  No worrisome MR imaging findings. These results will be called to the ordering clinician or representative by the Radiologist Assistant, and communication documented in the PACS or zVision Dashboard. Electronically Signed   By: Marijo Sanes M.D.   On: 08/04/2017 17:09   US Abdomen Limited Ruq  Result Date: 08/03/2017 CLINICAL DATA:  RIGHT upper quadrant pain. Leukocytosis and vomiting. Twenty-eight weeks pregnant. EXAM: ULTRASOUND ABDOMEN LIMITED RIGHT UPPER QUADRANT COMPARISON:  None. FINDINGS: Gallbladder: No gallstones or wall thickening visualized. No sonographic Murphy sign noted by sonographer. Common bile duct: Diameter: 2 mm Liver: No focal lesion identified. Within normal limits in parenchymal echogenicity. Portal vein is patent on color Doppler imaging with normal direction of blood flow towards the liver. IMPRESSION: Negative RIGHT upper quadrant ultrasound. Electronically  Signed   By: Elon Alas M.D.   On: 08/03/2017 23:54     Anti-infectives:   Anti-infectives (From admission, onward)   Start     Dose/Rate Route Frequency Ordered Stop   08/04/17 1230  Ampicillin-Sulbactam (UNASYN) 3 g in sodium chloride 0.9 % 100 mL IVPB     3 g 200 mL/hr over 30 Minutes Intravenous Every 6 hours 08/04/17 1220        Alphonsa Overall, MD, FACS Pager: [place] [facility] Office: [phone] 08/05/2017

## 2017-08-05 NOTE — Progress Notes (Signed)
Patient ID: Patricia Berry, female   DOB: 06-May-1986, 32 y.o.   MRN: [id number]   Pt desires discharge to home Ambulating, voiding and tolerating po  D/W pt preterm precautions and preop expectations Will f/u with Dr Lucia Gaskins and [facility]

## 2017-08-05 NOTE — Progress Notes (Signed)
Pt discharged ambulatory with significant other after IV removal. Obix strip reviewed by Dr Melba Coon and Maudie Mercury RN. Education completed, discharge instructions reviewed, educated on pain management. Questions answered.

## 2017-08-05 NOTE — Discharge Instructions (Signed)
[facility] - DISCHARGE INSTRUCTIONS TO PATIENT  Activity:  Driving - May drive in 1 - 3 days, if doing well and off pain meds   Lifting - No lifting more than 15 pounds for 5 days, then no limit  Wound Care:   May shower starting Sunday, 2/17.  Diet:  As tolerated  Follow up appointment:  Call Dr. Pollie Friar office [facility]) at [phone] for an appointment in 2-3 weeks  Call Dr. Lucia Gaskins or his office  [phone]) if you have:  Temperature greater than 100.4,  Severe uncontrolled pain,  Redness, tenderness, or signs of infection (pain, swelling, redness, odor or green/yellow discharge around the site),  Any other questions or concerns you may have after discharge.  In an emergency, call 911 or go to an Emergency Department at a nearby hospital.

## 2017-08-07 ENCOUNTER — Encounter: Payer: Self-pay | Admitting: *Deleted

## 2017-08-07 NOTE — Addendum Note (Signed)
Addendum  created 08/07/17 0636 by Lollie Sails, CRNA   Charge Capture section accepted

## 2017-09-19 LAB — OB RESULTS CONSOLE GBS: STREP GROUP B AG: NEGATIVE

## 2017-09-21 ENCOUNTER — Telehealth (HOSPITAL_COMMUNITY): Payer: Self-pay | Admitting: *Deleted

## 2017-09-21 ENCOUNTER — Encounter (HOSPITAL_COMMUNITY): Payer: Self-pay | Admitting: *Deleted

## 2017-09-21 NOTE — Telephone Encounter (Signed)
Preadmission screen  

## 2017-09-24 ENCOUNTER — Other Ambulatory Visit: Payer: Self-pay

## 2017-09-24 ENCOUNTER — Encounter (HOSPITAL_COMMUNITY): Payer: Self-pay

## 2017-09-24 ENCOUNTER — Inpatient Hospital Stay (HOSPITAL_COMMUNITY)
Admission: AD | Admit: 2017-09-24 | Discharge: 2017-09-24 | Disposition: A | Discharge status: Home / Self Care | Payer: BC Managed Care – PPO | Source: Ambulatory Visit | Attending: Obstetrics and Gynecology | Admitting: Obstetrics and Gynecology

## 2017-09-24 DIAGNOSIS — Z3A36 36 weeks gestation of pregnancy: Secondary | ICD-10-CM | POA: Insufficient documentation

## 2017-09-24 DIAGNOSIS — O163 Unspecified maternal hypertension, third trimester: Secondary | ICD-10-CM | POA: Diagnosis not present

## 2017-09-24 DIAGNOSIS — O368130 Decreased fetal movements, third trimester, not applicable or unspecified: Secondary | ICD-10-CM | POA: Diagnosis present

## 2017-09-24 DIAGNOSIS — Z3689 Encounter for other specified antenatal screening: Secondary | ICD-10-CM

## 2017-09-24 LAB — URINALYSIS, ROUTINE W REFLEX MICROSCOPIC
BILIRUBIN URINE: NEGATIVE
Glucose, UA: NEGATIVE mg/dL
Hgb urine dipstick: NEGATIVE
KETONES UR: NEGATIVE mg/dL
LEUKOCYTES UA: NEGATIVE
NITRITE: NEGATIVE
PROTEIN: NEGATIVE mg/dL
Specific Gravity, Urine: 1.014 (ref 1.005–1.030)
pH: 7 (ref 5.0–8.0)

## 2017-09-24 LAB — COMPREHENSIVE METABOLIC PANEL
ALBUMIN: 2.5 g/dL — AB (ref 3.5–5.0)
ALT: 15 U/L (ref 14–54)
AST: 17 U/L (ref 15–41)
Alkaline Phosphatase: 84 U/L (ref 38–126)
Anion gap: 9 (ref 5–15)
BUN: 10 mg/dL (ref 6–20)
CHLORIDE: 107 mmol/L (ref 101–111)
CO2: 20 mmol/L — AB (ref 22–32)
CREATININE: 0.54 mg/dL (ref 0.44–1.00)
Calcium: 8.6 mg/dL — ABNORMAL LOW (ref 8.9–10.3)
GFR calc non Af Amer: >60 mL/min (ref >60)
GLUCOSE: 108 mg/dL — AB (ref 65–99)
Potassium: 3.7 mmol/L (ref 3.5–5.1)
SODIUM: 136 mmol/L (ref 135–145)
Total Bilirubin: 0.1 mg/dL — ABNORMAL LOW (ref 0.3–1.2)
Total Protein: 5.8 g/dL — ABNORMAL LOW (ref 6.5–8.1)

## 2017-09-24 LAB — CBC
HCT: 35.6 % — ABNORMAL LOW (ref 36.0–46.0)
Hemoglobin: 12.2 g/dL (ref 12.0–15.0)
MCH: 28.2 pg (ref 26.0–34.0)
MCHC: 34.3 g/dL (ref 30.0–36.0)
MCV: 82.2 fL (ref 78.0–100.0)
PLATELETS: 288 10*3/uL (ref 150–400)
RBC: 4.33 MIL/uL (ref 3.87–5.11)
RDW: 15 % (ref 11.5–15.5)
WBC: 14.6 10*3/uL — AB (ref 4.0–10.5)

## 2017-09-24 LAB — PROTEIN / CREATININE RATIO, URINE
Creatinine, Urine: 80 mg/dL
Protein Creatinine Ratio: 0.13 mg/mg{Cre} (ref 0.00–0.15)
TOTAL PROTEIN, URINE: 10 mg/dL

## 2017-09-24 NOTE — MAU Provider Note (Signed)
Chief Complaint:  Decreased Fetal Movement and BP eval   None     HPI: Patricia Berry is a 32 y.o. G1P0000 at 68w0dwho presents to maternity admissions reporting decreased fetal movement today and [facility] blood [facility]. She reports an elevated BP with normal retake in the office last week and was told to recheck her pressure 1-2 times over the weekend. She does not have a cuff at home so went to [facility] today [facility]. She reports that she did not feel any fetal movement at home this morning before going to [facility] even after drinking cold water, eating ice cream, and resting.  With the elevated BP and decreased movement, she called the office and was sent to MAU.  She denies h/a, epigastric pain, or visual disturbances.  She has not tried any treatments for HTN.  There are no other associated symptoms.  She is feeling fetal movement in MAU.   HPI  Past Medical History: Past Medical History:  Diagnosis Date  . Cholestasis during pregnancy   . Heart disease   . Hypertension   . Vitamin D deficiency     Past obstetric history: OB History  Gravida Para Term Preterm AB Living  1 0 0 0 0 0  SAB TAB Ectopic Multiple Live Births  0 0 0 0 0    # Outcome Date GA Lbr Len/2nd Weight Sex Delivery Anes PTL Lv  1 Current             Past Surgical History: Past Surgical History:  Procedure Laterality Date  . LAPAROSCOPIC APPENDECTOMY N/A 08/04/2017   Procedure: APPENDECTOMY LAPAROSCOPIC;  Surgeon: NAlphonsa Overall MD;  Location: [facility];  Service: General;  Laterality: N/A;  . NO PAST SURGERIES      Family History: Family History  Problem Relation Age of Onset  . Hyperlipidemia Mother   . Mental illness Father   . Cancer Maternal Aunt        small cell  . Heart disease Maternal Grandmother        heart attack  . Early death Maternal Grandmother   . Prostate cancer Maternal Grandfather   . Mental illness Paternal Grandmother     Social History: Social History    Tobacco Use  . Smoking status: Never Smoker  . Smokeless tobacco: Never Used  Substance Use Topics  . Alcohol use: Yes    Alcohol/week: 1.2 oz    Types: 2 Glasses of wine per week  . Drug use: No    Allergies: No Known Allergies  Meds:  Medications Prior to Admission  Medication Sig Dispense Refill Last Dose  . HYDROcodone-acetaminophen (NORCO/VICODIN) 5-325 MG tablet Take 1-2 tablets by mouth every 6 (six) hours as needed for moderate pain. 20 tablet 0   . Prenatal Vit-Fe Fumarate-FA (PRENATAL MULTIVITAMIN) TABS tablet Take 1 tablet by mouth daily at 12 noon.   08/02/2017  . valACYclovir (VALTREX) 1000 MG tablet 2g once then repeat dose in 12 hours once at onset of cold sore (Patient not taking: Reported on 05/31/2017) 20 tablet 2 Not Taking    ROS:  Review of Systems  Constitutional: Negative for chills, fatigue and fever.  Eyes: Negative for visual disturbance.  Respiratory: Negative for shortness of breath.   Cardiovascular: Negative for chest pain.  Gastrointestinal: Negative for abdominal pain, nausea and vomiting.  Genitourinary: Negative for difficulty urinating, dysuria, flank pain, pelvic pain, vaginal bleeding, vaginal discharge and vaginal pain.  Neurological: Negative for  dizziness and headaches.  Psychiatric/Behavioral: Negative.      I have reviewed patient's Past Medical Hx, Surgical Hx, Family Hx, Social Hx, medications and allergies.   Physical Exam   Patient Vitals for the past 24 hrs:  BP Temp Temp src Pulse Resp SpO2 Height Weight  09/24/17 1816 127/76 - - 84 - - - -  09/24/17 1812 134/85 - - 89 - - - -  09/24/17 1747 (!) 150/105 98.1 F (36.7 C) Oral 85 20 99 % 5' 4"  (1.626 m) (!) 349 lb 12 oz (158.6 kg)   Constitutional: Well-developed, well-nourished female in no acute distress.  HEART: normal rate, heart sounds, regular rhythm RESP: normal effort, lung sounds clear and equal bilaterally GI: Abd soft, non-tender, gravid appropriate for  gestational age.  MS: Extremities nontender, no edema, normal ROM Neurologic: Alert and oriented x 4.  GU: Neg CVAT.    FHT:  Baseline 135 , moderate variability, accelerations present, no decelerations Contractions: None on toco or to palpation   Labs: Results for orders placed or performed during the hospital encounter of 09/24/17 (from the past 24 hour(s))  Urinalysis, Routine w reflex microscopic     Status: Abnormal   Collection Time: 09/24/17  6:00 PM  Result Value Ref Range   Color, Urine YELLOW YELLOW   APPearance HAZY (A) CLEAR   Specific Gravity, Urine 1.014 1.005 - 1.030   pH 7.0 5.0 - 8.0   Glucose, UA NEGATIVE NEGATIVE mg/dL   Hgb urine dipstick NEGATIVE NEGATIVE   Bilirubin Urine NEGATIVE NEGATIVE   Ketones, ur NEGATIVE NEGATIVE mg/dL   Protein, ur NEGATIVE NEGATIVE mg/dL   Nitrite NEGATIVE NEGATIVE   Leukocytes, UA NEGATIVE NEGATIVE  Protein / creatinine ratio, urine     Status: None   Collection Time: 09/24/17  6:00 PM  Result Value Ref Range   Creatinine, Urine 80.00 mg/dL   Total Protein, Urine 10 mg/dL   Protein Creatinine Ratio 0.13 0.00 - 0.15 mg/mg[Cre]  CBC     Status: Abnormal   Collection Time: 09/24/17  6:26 PM  Result Value Ref Range   WBC 14.6 (H) 4.0 - 10.5 K/uL   RBC 4.33 3.87 - 5.11 MIL/uL   Hemoglobin 12.2 12.0 - 15.0 g/dL   HCT 35.6 (L) 36.0 - 46.0 %   MCV 82.2 78.0 - 100.0 fL   MCH 28.2 26.0 - 34.0 pg   MCHC 34.3 30.0 - 36.0 g/dL   RDW 15.0 11.5 - 15.5 %   Platelets 288 150 - 400 K/uL  Comprehensive metabolic panel     Status: Abnormal   Collection Time: 09/24/17  6:26 PM  Result Value Ref Range   Sodium 136 135 - 145 mmol/L   Potassium 3.7 3.5 - 5.1 mmol/L   Chloride 107 101 - 111 mmol/L   CO2 20 (L) 22 - 32 mmol/L   Glucose, Bld 108 (H) 65 - 99 mg/dL   BUN 10 6 - 20 mg/dL   Creatinine, Ser 0.54 0.44 - 1.00 mg/dL   Calcium 8.6 (L) 8.9 - 10.3 mg/dL   Total Protein 5.8 (L) 6.5 - 8.1 g/dL   Albumin 2.5 (L) 3.5 - 5.0 g/dL   AST  17 15 - 41 U/L   ALT 15 14 - 54 U/L   Alkaline Phosphatase 84 38 - 126 U/L   Total Bilirubin 0.1 (L) 0.3 - 1.2 mg/dL   GFR calc non Af Amer >60 >60 mL/min   GFR calc Af Amer >60 >60 mL/min  Anion gap 9 5 - 15   B/Positive/-- (10/01 0000)  Imaging:  No results found.  MAU Course/MDM: CBC, CMP, P/C ratio wnl with P/C ratio of 0.13 NST reviewed and reactive Pt feeling some fetal movement now in MAU No evidence of preeclampsia with normal labs, normal BP after initial intake BP, and no symptoms Consult Dr Marvel Plan with presentation, exam findings and test results.  D/C home  F/U in office as scheduled this week,  IOL next week at 37 weeks for cholestasis of pregnancy Return to MAU as needed Pt discharge with strict preeclampsia and fetal movement precautions.   Assessment: 1. Decreased fetal movements in third trimester, single or unspecified fetus   2. NST (non-stress test) reactive   3. Hypertension during pregnancy in third trimester, unspecified hypertension in pregnancy type     Plan: Discharge home Labor precautions and fetal kick counts Follow-up Information    Paula Compton, MD Follow up.   Specialty:  Obstetrics and Gynecology Why:  As scheduled, return to MAU as needed for signs of labor or emergencies Contact information: [address] [place] [address] [phone]          Allergies as of 09/24/2017   No Known Allergies     Medication List    STOP taking these medications   HYDROcodone-acetaminophen 5-325 MG tablet Commonly known as:  NORCO/VICODIN   valACYclovir 1000 MG tablet Commonly known as:  VALTREX     TAKE these medications   prenatal multivitamin Tabs tablet Take 1 tablet by mouth daily at 12 noon.       Fatima Blank Certified Nurse-Midwife 09/24/2017 8:01 PM

## 2017-09-24 NOTE — Progress Notes (Signed)
Patient ID: Patricia Berry, female   DOB: 01/31/1986, 32 y.o.   MRN: [id number] Pt seen for decreased FM with category 1 tracing.  Crooks labs run and WNL.  BP normalized at rest.  120-130/70-80

## 2017-09-24 NOTE — MAU Note (Signed)
Pt presents with c/o no FM today and BP eval.  Reports [facility], BP elevated 150's and 160's on three separate occasions.  Reports been observing BP's office.  Denies H/A, visual disturbances, or epigastric pain.  Reports increase in swelling.

## 2017-10-02 ENCOUNTER — Encounter (HOSPITAL_COMMUNITY): Payer: Self-pay

## 2017-10-02 ENCOUNTER — Inpatient Hospital Stay (HOSPITAL_COMMUNITY)
Admission: RE | Admit: 2017-10-02 | Discharge: 2017-10-05 | DRG: 805 | Disposition: A | Discharge status: Home / Self Care | Payer: BC Managed Care – PPO | Source: Ambulatory Visit | Attending: Obstetrics and Gynecology | Admitting: Obstetrics and Gynecology

## 2017-10-02 ENCOUNTER — Other Ambulatory Visit: Payer: Self-pay

## 2017-10-02 DIAGNOSIS — Z8759 Personal history of other complications of pregnancy, childbirth and the puerperium: Secondary | ICD-10-CM

## 2017-10-02 DIAGNOSIS — O2662 Liver and biliary tract disorders in childbirth: Principal | ICD-10-CM | POA: Diagnosis present

## 2017-10-02 DIAGNOSIS — K831 Obstruction of bile duct: Secondary | ICD-10-CM | POA: Diagnosis present

## 2017-10-02 DIAGNOSIS — Z3A37 37 weeks gestation of pregnancy: Secondary | ICD-10-CM | POA: Diagnosis not present

## 2017-10-02 DIAGNOSIS — O134 Gestational [pregnancy-induced] hypertension without significant proteinuria, complicating childbirth: Secondary | ICD-10-CM | POA: Diagnosis present

## 2017-10-02 DIAGNOSIS — Z3490 Encounter for supervision of normal pregnancy, unspecified, unspecified trimester: Secondary | ICD-10-CM | POA: Diagnosis present

## 2017-10-02 DIAGNOSIS — O99214 Obesity complicating childbirth: Secondary | ICD-10-CM | POA: Diagnosis present

## 2017-10-02 HISTORY — DX: Personal history of other complications of pregnancy, childbirth and the puerperium: Z87.59

## 2017-10-02 LAB — COMPREHENSIVE METABOLIC PANEL
ALBUMIN: 2.6 g/dL — AB (ref 3.5–5.0)
ALT: 16 U/L (ref 14–54)
AST: 14 U/L — AB (ref 15–41)
Alkaline Phosphatase: 75 U/L (ref 38–126)
Anion gap: 10 (ref 5–15)
BILIRUBIN TOTAL: 0.4 mg/dL (ref 0.3–1.2)
BUN: 10 mg/dL (ref 6–20)
CHLORIDE: 105 mmol/L (ref 101–111)
CO2: 21 mmol/L — ABNORMAL LOW (ref 22–32)
Calcium: 8.6 mg/dL — ABNORMAL LOW (ref 8.9–10.3)
Creatinine, Ser: 0.57 mg/dL (ref 0.44–1.00)
GFR calc Af Amer: >60 mL/min (ref >60)
GFR calc non Af Amer: >60 mL/min (ref >60)
GLUCOSE: 85 mg/dL (ref 65–99)
POTASSIUM: 3.9 mmol/L (ref 3.5–5.1)
Sodium: 136 mmol/L (ref 135–145)
Total Protein: 6.1 g/dL — ABNORMAL LOW (ref 6.5–8.1)

## 2017-10-02 LAB — PROTEIN / CREATININE RATIO, URINE
Creatinine, Urine: 94 mg/dL
Protein Creatinine Ratio: 0.09 mg/mg{Cre} (ref 0.00–0.15)
Total Protein, Urine: 8 mg/dL

## 2017-10-02 LAB — CBC
HCT: 35.9 % — ABNORMAL LOW (ref 36.0–46.0)
Hemoglobin: 12.4 g/dL (ref 12.0–15.0)
MCH: 28.5 pg (ref 26.0–34.0)
MCHC: 34.5 g/dL (ref 30.0–36.0)
MCV: 82.5 fL (ref 78.0–100.0)
PLATELETS: 282 10*3/uL (ref 150–400)
RBC: 4.35 MIL/uL (ref 3.87–5.11)
RDW: 15.5 % (ref 11.5–15.5)
WBC: 14.5 10*3/uL — ABNORMAL HIGH (ref 4.0–10.5)

## 2017-10-02 LAB — TYPE AND SCREEN
ABO/RH(D): B POS
Antibody Screen: NEGATIVE

## 2017-10-02 LAB — ABO/RH: ABO/RH(D): B POS

## 2017-10-02 LAB — RPR: RPR Ser Ql: NONREACTIVE

## 2017-10-02 MED ORDER — TERBUTALINE SULFATE 1 MG/ML IJ SOLN: 0.2500 mg | Freq: Once | INTRAMUSCULAR | Status: DC | PRN

## 2017-10-02 MED ORDER — LACTATED RINGERS IV SOLN: 500.0000 mL | INTRAVENOUS | Status: DC | PRN

## 2017-10-02 MED ORDER — OXYTOCIN 40 UNITS IN LACTATED RINGERS INFUSION - SIMPLE MED
2.5000 [IU]/h | INTRAVENOUS | Status: DC
  Administered 2017-10-04: 2.5 [IU]/h via INTRAVENOUS
  Filled 2017-10-02: qty 1000

## 2017-10-02 MED ORDER — LABETALOL HCL 5 MG/ML IV SOLN: 20.0000 mg | INTRAVENOUS | Status: DC | PRN

## 2017-10-02 MED ORDER — OXYTOCIN 40 UNITS IN LACTATED RINGERS INFUSION - SIMPLE MED
1.0000 m[IU]/min | INTRAVENOUS | Status: DC
  Administered 2017-10-02: 2 m[IU]/min via INTRAVENOUS
  Administered 2017-10-03: 5 m[IU]/min via INTRAVENOUS
  Filled 2017-10-02: qty 1000

## 2017-10-02 MED ORDER — HYDRALAZINE HCL 20 MG/ML IJ SOLN: 10.0000 mg | Freq: Once | INTRAMUSCULAR | Status: DC | PRN

## 2017-10-02 MED ORDER — MISOPROSTOL 25 MCG QUARTER TABLET
25.0000 ug | ORAL_TABLET | ORAL | Status: DC | PRN
  Administered 2017-10-02 (×2): 25 ug via VAGINAL
  Filled 2017-10-02 (×3): qty 1

## 2017-10-02 MED ORDER — SOD CITRATE-CITRIC ACID 500-334 MG/5ML PO SOLN
30.0000 mL | ORAL | Status: DC | PRN
  Filled 2017-10-02: qty 15

## 2017-10-02 MED ORDER — URSODIOL 300 MG PO CAPS
300.0000 mg | ORAL_CAPSULE | Freq: Two times a day (BID) | ORAL | Status: DC
  Administered 2017-10-02: 300 mg via ORAL
  Filled 2017-10-02 (×6): qty 1

## 2017-10-02 MED ORDER — LIDOCAINE HCL (PF) 1 % IJ SOLN
30.0000 mL | INTRAMUSCULAR | Status: DC | PRN
  Administered 2017-10-04: 30 mL via SUBCUTANEOUS
  Filled 2017-10-02: qty 30

## 2017-10-02 MED ORDER — LACTATED RINGERS IV SOLN
INTRAVENOUS | Status: DC
  Administered 2017-10-02 – 2017-10-03 (×7): via INTRAVENOUS

## 2017-10-02 MED ORDER — OXYCODONE-ACETAMINOPHEN 5-325 MG PO TABS: 1.0000 | ORAL_TABLET | ORAL | Status: DC | PRN

## 2017-10-02 MED ORDER — BUTORPHANOL TARTRATE 1 MG/ML IJ SOLN
2.0000 mg | Freq: Once | INTRAMUSCULAR | Status: AC
  Administered 2017-10-02: 2 mg via INTRAVENOUS
  Filled 2017-10-02: qty 2

## 2017-10-02 MED ORDER — OXYTOCIN BOLUS FROM INFUSION
500.0000 mL | Freq: Once | INTRAVENOUS | Status: AC
  Administered 2017-10-04: 500 mL via INTRAVENOUS

## 2017-10-02 MED ORDER — OXYCODONE-ACETAMINOPHEN 5-325 MG PO TABS: 2.0000 | ORAL_TABLET | ORAL | Status: DC | PRN

## 2017-10-02 MED ORDER — ALPRAZOLAM 0.5 MG PO TABS
0.5000 mg | ORAL_TABLET | Freq: Once | ORAL | Status: AC
  Administered 2017-10-02: 0.5 mg via ORAL
  Filled 2017-10-02: qty 1

## 2017-10-02 MED ORDER — OXYTOCIN 40 UNITS IN LACTATED RINGERS INFUSION - SIMPLE MED: 1.0000 m[IU]/min | INTRAVENOUS | Status: DC

## 2017-10-02 MED ORDER — BUTORPHANOL TARTRATE 1 MG/ML IJ SOLN
1.0000 mg | INTRAMUSCULAR | Status: DC | PRN
  Administered 2017-10-02 – 2017-10-03 (×2): 1 mg via INTRAVENOUS
  Filled 2017-10-02 (×4): qty 1

## 2017-10-02 MED ORDER — ACETAMINOPHEN 325 MG PO TABS
650.0000 mg | ORAL_TABLET | ORAL | Status: DC | PRN
  Administered 2017-10-02: 650 mg via ORAL
  Filled 2017-10-02: qty 2

## 2017-10-02 MED ORDER — PRENATAL MULTIVITAMIN CH
1.0000 | ORAL_TABLET | Freq: Every day | ORAL | Status: DC
  Administered 2017-10-02: 1 via ORAL
  Filled 2017-10-02: qty 1

## 2017-10-02 MED ORDER — ONDANSETRON HCL 4 MG/2ML IJ SOLN: 4.0000 mg | Freq: Four times a day (QID) | INTRAMUSCULAR | Status: DC | PRN

## 2017-10-02 MED ORDER — PRENATAL MULTIVITAMIN CH: 1.0000 | ORAL_TABLET | Freq: Every day | ORAL | Status: DC

## 2017-10-02 NOTE — Progress Notes (Addendum)
Assumed care of patient at 77.

## 2017-10-02 NOTE — Progress Notes (Signed)
Patient ID: Patricia Berry, female   DOB: August 30, 1985, 32 y.o.   MRN: [id number] Pt with increasing discomfort due to contractions. BP stable, Cat 1 strip, irreg contractions Cervix now midposition but still high and ft.  Pt given 36m stadol  Cooks catheter placed with 60u/40v Pit at 161m  - recheck in 4-6 hours or prn

## 2017-10-02 NOTE — H&P (Addendum)
Patricia Berry is a 32 y.o. G1P0 at 41 1/[redacted]wks gestation female presenting for scheduled iol for cholestasis of pregnancy and pregnancy induced hypertension. Pt is dated by a 9 week ultrasound. Her pregnancy has been complicated by morbid obesity. PIH begun at  [redacted]weeks gestation; preE workup has consistently been neg. She has denied HAs, and blurry vision. She had a laparoscopic appendectomy at [redacted] weeks gestation. At [redacted] weeks gestation she begun having itching on the soles of her feet and ws subsequently found to have cholestasis of pregnancy. She had twice weekly NSTS/bpps. Neg first trimester and essential panel screening done. GBS negative.Pt arrived overnight for cervical ripening and has received two doses of cytotec ( last at 630am) OB History    Gravida  1   Para  0   Term  0   Preterm  0   AB  0   Living  0     SAB  0   TAB  0   Ectopic  0   Multiple  0   Live Births  0          Past Medical History:  Diagnosis Date  . Cholestasis during pregnancy   . Heart disease   . Hypertension   . Vitamin D deficiency    Past Surgical History:  Procedure Laterality Date  . LAPAROSCOPIC APPENDECTOMY N/A 08/04/2017   Procedure: APPENDECTOMY LAPAROSCOPIC;  Surgeon: Alphonsa Overall, MD;  Location: [facility];  Service: General;  Laterality: N/A;  . NO PAST SURGERIES     Family History: family history includes Cancer in her maternal aunt; Early death in her maternal grandmother; Heart disease in her maternal grandmother; Hyperlipidemia in her mother; Mental illness in her father and paternal grandmother; Prostate cancer in her maternal grandfather. Social History:  reports that she has never smoked. She has never used smokeless tobacco. She reports that she drinks about 1.2 oz of alcohol per week. She reports that she does not use drugs.     Maternal Diabetes: No Genetic Screening: Normal Maternal Ultrasounds/Referrals: Normal Fetal Ultrasounds or other Referrals:  None Maternal  Substance Abuse:  No Significant Maternal Medications:  Meds include: Other: urosodiol 354m po bid Significant Maternal Lab Results:  Lab values include: Group B Strep negative Other Comments:  None  Review of Systems  Constitutional: Positive for malaise/fatigue. Negative for chills, fever and weight loss.  Eyes: Negative for blurred vision.  Respiratory: Positive for shortness of breath.   Cardiovascular: Positive for leg swelling. Negative for chest pain.  Gastrointestinal: Positive for abdominal pain. Negative for heartburn, nausea and vomiting.  Genitourinary: Negative for dysuria.  Musculoskeletal: Positive for back pain and myalgias.  Skin: Positive for itching.  Neurological: Negative for dizziness and headaches.  Endo/Heme/Allergies: Does not bruise/bleed easily.  Psychiatric/Behavioral: Negative for depression, hallucinations, substance abuse and suicidal ideas. The patient is nervous/anxious.    Maternal Medical History:  Reason for admission: Nausea. Cholestasis of pregnancy  Fetal activity: Perceived fetal activity is normal.   Last perceived fetal movement was within the past hour.    Prenatal complications: PIH.   Prenatal Complications - Diabetes: none.    Dilation: Fingertip Effacement (%): 20 Station: -3 Exam by:: Rebeckah Masih Blood pressure (!) 142/97, pulse 73, temperature 97.8 F (36.6 C), temperature source Oral, resp. rate 18, height 5' 4"  (1.626 m), weight (!) 350 lb 3.2 oz (158.8 kg), last menstrual period 01/08/2017. Maternal Exam:  Uterine Assessment: Contraction strength is mild.  Contraction frequency is rare.   Abdomen: Patient  reports generalized tenderness.  Estimated fetal weight is AGA.   Fetal presentation: vertex  Introitus: Normal vulva. Vulva is negative for condylomata and lesion.  Normal vagina.  Vagina is negative for condylomata and discharge.  Pelvis: adequate for delivery.   Cervix: Cervix evaluated by digital exam.     Physical  Exam  Constitutional: She is oriented to person, place, and time. She appears well-developed.  Neck: Normal range of motion.  Cardiovascular: Normal rate.  Respiratory: Effort normal.  GI: Soft. There is generalized tenderness.  Genitourinary: Vagina normal and uterus normal. Vulva exhibits no lesion. No vaginal discharge found.  Musculoskeletal: Normal range of motion. She exhibits edema and tenderness.  Neurological: She is alert and oriented to person, place, and time.  Skin: Skin is warm.  Psychiatric: She has a normal mood and affect. Her behavior is normal. Judgment and thought content normal.    Prenatal labs: ABO, Rh: --/--/B POS, B POS Performed at [facility], [address] [place], [place] [address]  [phone]) Antibody: NEG (04/15 0157) Rubella: Immune (10/01 0000) RPR: Nonreactive (10/01 0000)  HBsAg: Negative (10/01 0000)  HIV: Non-reactive, negative (10/01 0000)  GBS: Negative (04/02 0000)   Assessment/Plan: G1P0 at 33 1/[redacted]wks gestation with cholestasis of pregnancy, PIH, morbid obesity for iol S/P cytotec x 2 Nl preE labs Will consider foley bulb with pitocin in two hours CAT 1 strip Pain control prn Plan for SVD   Venetia Night Stephaine Breshears 10/02/2017, 8:57 AM

## 2017-10-02 NOTE — Progress Notes (Signed)
RN in room for 15 minutes adjusting [place]. Fetal movement detected. Patient needed to get up and use the restroom. Monitors will be applied after.

## 2017-10-02 NOTE — Progress Notes (Signed)
Patient ID: Patricia Berry, female   DOB: 1985-10-01, 32 y.o.   MRN: [id number] Pt doing well with no complaints BP stable CAT 1  150s Rare contraction SVE unchanged: ft high  Plan: continue pit per protocol          Attempt foley bulb with next check if no change

## 2017-10-02 NOTE — Progress Notes (Signed)
Patient up to the bathroom frequently. Toco and FHR adjusted frequently

## 2017-10-02 NOTE — Anesthesia Pain Management Evaluation Note (Signed)
  CRNA Pain Management Visit Note  Patient: Patricia Berry, 32 y.o., female  "Hello I am a member of the anesthesia team at [facility]. We have an anesthesia team available at all times to provide care throughout the hospital, including epidural management and anesthesia for C-section. I don't know your plan for the delivery whether it a natural birth, water birth, IV sedation, nitrous supplementation, doula or epidural, but we want to meet your pain goals."   1.Was your pain managed to your expectations on prior hospitalizations?   No prior hospitalizations  2.What is your expectation for pain management during this hospitalization?     Epidural  3.How can we help you reach that goal? Epidural   Record the patient's initial score and the patient's pain goal.   Pain: 3  Pain Goal: 0 [facility] wants you to be able to say your pain was always managed very well.  [name] 10/02/2017

## 2017-10-03 ENCOUNTER — Inpatient Hospital Stay (HOSPITAL_COMMUNITY): Payer: BC Managed Care – PPO | Admitting: Anesthesiology

## 2017-10-03 LAB — CBC
HEMATOCRIT: 38.9 % (ref 36.0–46.0)
Hemoglobin: 13.5 g/dL (ref 12.0–15.0)
MCH: 28.5 pg (ref 26.0–34.0)
MCHC: 34.7 g/dL (ref 30.0–36.0)
MCV: 82.2 fL (ref 78.0–100.0)
PLATELETS: 293 10*3/uL (ref 150–400)
RBC: 4.73 MIL/uL (ref 3.87–5.11)
RDW: 15.1 % (ref 11.5–15.5)
WBC: 12.8 10*3/uL — AB (ref 4.0–10.5)

## 2017-10-03 MED ORDER — PHENYLEPHRINE 40 MCG/ML (10ML) SYRINGE FOR IV PUSH (FOR BLOOD PRESSURE SUPPORT)
80.0000 ug | PREFILLED_SYRINGE | INTRAVENOUS | Status: DC | PRN
  Administered 2017-10-03 (×2): 80 ug via INTRAVENOUS
  Filled 2017-10-03: qty 5

## 2017-10-03 MED ORDER — LABETALOL HCL 5 MG/ML IV SOLN: 20.0000 mg | INTRAVENOUS | Status: DC | PRN

## 2017-10-03 MED ORDER — EPHEDRINE 5 MG/ML INJ
10.0000 mg | INTRAVENOUS | Status: DC | PRN
Start: 2017-10-03 — End: 2017-10-03

## 2017-10-03 MED ORDER — EPHEDRINE 5 MG/ML INJ
10.0000 mg | INTRAVENOUS | Status: AC | PRN
  Administered 2017-10-03 (×2): 10 mg via INTRAVENOUS

## 2017-10-03 MED ORDER — PHENYLEPHRINE 40 MCG/ML (10ML) SYRINGE FOR IV PUSH (FOR BLOOD PRESSURE SUPPORT): 80.0000 ug | PREFILLED_SYRINGE | INTRAVENOUS | Status: DC | PRN

## 2017-10-03 MED ORDER — DIPHENHYDRAMINE HCL 50 MG/ML IJ SOLN: 12.5000 mg | INTRAMUSCULAR | Status: DC | PRN

## 2017-10-03 MED ORDER — LACTATED RINGERS IV SOLN
500.0000 mL | Freq: Once | INTRAVENOUS | Status: DC
  Administered 2017-10-03: 500 mL via INTRAVENOUS

## 2017-10-03 MED ORDER — EPHEDRINE 5 MG/ML INJ
10.0000 mg | Freq: Once | INTRAVENOUS | Status: AC
  Administered 2017-10-03: 10 mg via INTRAVENOUS

## 2017-10-03 MED ORDER — EPHEDRINE 5 MG/ML INJ
10.0000 mg | INTRAVENOUS | Status: AC | PRN
  Administered 2017-10-03 (×2): 10 mg via INTRAVENOUS
  Filled 2017-10-03: qty 4

## 2017-10-03 MED ORDER — LACTATED RINGERS IV SOLN
500.0000 mL | Freq: Once | INTRAVENOUS | Status: AC
  Administered 2017-10-03: 500 mL via INTRAVENOUS

## 2017-10-03 MED ORDER — FENTANYL 2.5 MCG/ML BUPIVACAINE 1/10 % EPIDURAL INFUSION (WH - ANES)
14.0000 mL/h | INTRAMUSCULAR | Status: DC | PRN
  Administered 2017-10-03 – 2017-10-04 (×4): 14 mL/h via EPIDURAL
  Filled 2017-10-03 (×5): qty 100

## 2017-10-03 MED ORDER — PHENYLEPHRINE 40 MCG/ML (10ML) SYRINGE FOR IV PUSH (FOR BLOOD PRESSURE SUPPORT)
80.0000 ug | PREFILLED_SYRINGE | INTRAVENOUS | Status: DC | PRN
  Administered 2017-10-03: 80 ug via INTRAVENOUS
  Filled 2017-10-03: qty 10
  Filled 2017-10-03: qty 5

## 2017-10-03 MED ORDER — FENTANYL CITRATE (PF) 100 MCG/2ML IJ SOLN
100.0000 ug | Freq: Once | INTRAMUSCULAR | Status: AC
  Administered 2017-10-03: 100 ug via INTRAVENOUS
  Filled 2017-10-03: qty 2

## 2017-10-03 MED ORDER — HYDRALAZINE HCL 20 MG/ML IJ SOLN: 10.0000 mg | Freq: Once | INTRAMUSCULAR | Status: DC | PRN

## 2017-10-03 MED ORDER — LIDOCAINE HCL (PF) 1 % IJ SOLN
INTRAMUSCULAR | Status: DC | PRN
  Administered 2017-10-03: 4 mL

## 2017-10-03 MED ORDER — SODIUM BICARBONATE 8.4 % IV SOLN
INTRAVENOUS | Status: DC | PRN
  Administered 2017-10-03: 5 mL via EPIDURAL

## 2017-10-03 NOTE — Anesthesia Preprocedure Evaluation (Signed)
Anesthesia Evaluation  Patient identified by MRN, date of birth, ID band Patient awake    Reviewed: Allergy & Precautions, Patient's Chart, lab work & pertinent test results  Airway Mallampati: III       Dental no notable dental hx.    Pulmonary    Pulmonary exam normal        Cardiovascular hypertension, Normal cardiovascular exam     Neuro/Psych negative neurological ROS  negative psych ROS   GI/Hepatic negative GI ROS, Neg liver ROS,   Endo/Other  negative endocrine ROS  Renal/GU negative Renal ROS     Musculoskeletal negative musculoskeletal ROS (+)   Abdominal (+) + obese,   Peds  Hematology negative hematology ROS (+)   Anesthesia Other Findings   Reproductive/Obstetrics (+) Pregnancy                             Anesthesia Physical Anesthesia Plan  ASA: IV  Anesthesia Plan: Epidural   Post-op Pain Management:    Induction:   PONV Risk Score and Plan:   Airway Management Planned:   Additional Equipment:   Intra-op Plan:   Post-operative Plan:   Informed Consent: I have reviewed the patients History and Physical, chart, labs and discussed the procedure including the risks, benefits and alternatives for the proposed anesthesia with the patient or authorized representative who has indicated his/her understanding and acceptance.     Plan Discussed with:   Anesthesia Plan Comments: (Lab Results      Component                Value               Date                      WBC                      12.8 (H)            10/03/2017                HGB                      13.5                10/03/2017                HCT                      38.9                10/03/2017                MCV                      82.2                10/03/2017                PLT                      293                 10/03/2017           )        Anesthesia Quick Evaluation

## 2017-10-03 NOTE — Progress Notes (Signed)
Patient ID: Patricia Berry, female   DOB: [birth year], 32 y.o.   MRN: [id number] Came to recheck pt but she is finally fast asleep after being up all night  Baseline FHTs 135, +variability Contractions difficult to monitor on her side  Will allow pt to sleep for another hour then check

## 2017-10-03 NOTE — Progress Notes (Signed)
Spoke with Dr Melba Coon and Dr Smith Robert about severe range pressures.  Dr Melba Coon gives orders for the hypertensive protocol, but believes elevated pressures may be in relation to pain and anxiety.   Dr Smith Robert agrees and says to hold on labetolol until new epidural is placed and pressures stabilize.

## 2017-10-03 NOTE — Progress Notes (Signed)
Patient ID: Patricia Berry, female   DOB: [birth year], 32 y.o.   MRN: [id number]   Comfortable with redose.  AFVSS gen NAD FHTs 160's mod var, was having lates after redose with pressure decreased toco q 70mn  After 318m w better strip, will restart pitocin at 1/2 dose  D/W pt slow process of labor.  D/W pt IOL and process, also d/w pt latent phase vs active phase.  D/w pt r/b/a of LTCS.  Continue current management, close monitoring

## 2017-10-03 NOTE — Progress Notes (Signed)
Patient ID: Patricia Berry, female   DOB: 09-Apr-1986, 32 y.o.   MRN: [id number]  Working to get pt comfortable.  Epidural has been placed, will have anesthesia bolus.  Foley bulb out.    AFVSS gen NAD FHTs 140, min-mod var, category 2 toco irr, diff to trace  SVE 1.5 per RN Will AROM after comfortable  Close monitoring.

## 2017-10-03 NOTE — Progress Notes (Signed)
Patient ID: Patricia Berry, female   DOB: [birth year], 32 y.o.   MRN: [id number]   Uncomfortable with pressure/contraction pain.  8/10 - anesthesia to redose  AF VSS gen NAD FHTs 150's, mod var, + accels, category 1 (has had some late decels, category 2) toco q 2-62mn  SVE 5/90/-2 per RN  Cont current management FSE and IUPC previously placed

## 2017-10-03 NOTE — Progress Notes (Signed)
Patient ID: Patricia Berry, female   DOB: 1986-05-22, 32 y.o.   MRN: [id number]  CTSP secondary to bradycardia and decels.  Just had epidural replaced, some decreased BP  FHTs 140's w variables, then bradycardia to 60's.  Resolved with ephedrine and fluid, pitocin off FSE placed, IUPC placed  D/w pt and FOB situation and close monitoring  SVE 3.4/80/-1

## 2017-10-03 NOTE — Progress Notes (Signed)
Patient ID: Patricia Berry, female   DOB: May 02, 1986, 32 y.o.   MRN: [id number]   Feeling some ctx, as pressure  AF VSS gen NAD FHTs 140, min/mod var, + scalp stim, category 2 toco q 3-29mn  SVE 4.5/90/0  Continue IOL PCA dose epidural

## 2017-10-03 NOTE — Anesthesia Procedure Notes (Addendum)
Epidural Patient location during procedure: OB Start time: 10/03/2017 8:15 AM End time: 10/03/2017 8:24 AM  Staffing Anesthesiologist: Effie Berkshire, MD Performed: anesthesiologist   Preanesthetic Checklist Completed: patient identified, site marked, surgical consent, pre-op evaluation, timeout performed, IV checked, risks and benefits discussed and monitors and equipment checked  Epidural Patient position: sitting Prep: ChloraPrep Patient monitoring: heart rate, continuous pulse ox and blood pressure Approach: midline Location: L3-L4 Injection technique: LOR saline  Needle:  Needle type: Tuohy  Needle gauge: 17 G Needle length: 9 cm Catheter type: closed end flexible Catheter size: 20 Guage Test dose: negative and 1.5% lidocaine  Assessment Events: blood not aspirated, injection not painful, no injection resistance and no paresthesia  Additional Notes LOR @10   Patient identified. Risks/Benefits/Options discussed with patient including but not limited to bleeding, infection, nerve damage, paralysis, failed block, incomplete pain control, headache, blood pressure changes, nausea, vomiting, reactions to medications, itching and postpartum back pain. Confirmed with bedside nurse the patient's most recent platelet count. Confirmed with patient that they are not currently taking any anticoagulation, have any bleeding history or any family history of bleeding disorders. Patient expressed understanding and wished to proceed. All questions were answered. Sterile technique was used throughout the entire procedure. Please see nursing notes for vital signs. Test dose was given through epidural catheter and negative prior to continuing to dose epidural or start infusion. Warning signs of high block given to the patient including shortness of breath, tingling/numbness in hands, complete motor block, or any concerning symptoms with instructions to call for help. Patient was given instructions on  fall risk and not to get out of bed. All questions and concerns addressed with instructions to call with any issues or inadequate analgesia.    Reason for block:procedure for pain

## 2017-10-03 NOTE — Progress Notes (Signed)
Patient ID: Patricia Berry, female   DOB: 01/20/1986, 32 y.o.   MRN: [id number] Foley bulb came out with pt in bathroom Pt had to be catheterized earlier as was unable to void with foley in place; able to void now with foley out Cervix is now 1cm dil/80eff/-3  Unable to AROM due to station  CAT 1 strip noted  Pitocin increased to 64ms  Recheck in 2-3 hours

## 2017-10-04 ENCOUNTER — Encounter (HOSPITAL_COMMUNITY): Payer: Self-pay

## 2017-10-04 DIAGNOSIS — Z8759 Personal history of other complications of pregnancy, childbirth and the puerperium: Secondary | ICD-10-CM

## 2017-10-04 HISTORY — DX: Personal history of other complications of pregnancy, childbirth and the puerperium: Z87.59

## 2017-10-04 LAB — CBC
HEMATOCRIT: 35.3 % — AB (ref 36.0–46.0)
HEMATOCRIT: 37.2 % (ref 36.0–46.0)
HEMOGLOBIN: 12.5 g/dL (ref 12.0–15.0)
Hemoglobin: 12.1 g/dL (ref 12.0–15.0)
MCH: 28.4 pg (ref 26.0–34.0)
MCH: 28 pg (ref 26.0–34.0)
MCHC: 34.3 g/dL (ref 30.0–36.0)
MCHC: 33.6 g/dL (ref 30.0–36.0)
MCV: 82.9 fL (ref 78.0–100.0)
MCV: 83.2 fL (ref 78.0–100.0)
Platelets: 259 10*3/uL (ref 150–400)
Platelets: 279 10*3/uL (ref 150–400)
RBC: 4.26 MIL/uL (ref 3.87–5.11)
RBC: 4.47 MIL/uL (ref 3.87–5.11)
RDW: 15.3 % (ref 11.5–15.5)
RDW: 15.3 % (ref 11.5–15.5)
WBC: 15.2 10*3/uL — ABNORMAL HIGH (ref 4.0–10.5)
WBC: 17.7 10*3/uL — ABNORMAL HIGH (ref 4.0–10.5)

## 2017-10-04 MED ORDER — BENZOCAINE-MENTHOL 20-0.5 % EX AERO
1.0000 "application " | INHALATION_SPRAY | CUTANEOUS | Status: DC | PRN
  Administered 2017-10-04: 1 via TOPICAL
  Filled 2017-10-04: qty 56

## 2017-10-04 MED ORDER — COCONUT OIL OIL
1.0000 "application " | TOPICAL_OIL | Status: DC | PRN
  Administered 2017-10-04: 1 via TOPICAL
  Filled 2017-10-04: qty 120

## 2017-10-04 MED ORDER — DIBUCAINE 1 % RE OINT: 1.0000 "application " | TOPICAL_OINTMENT | RECTAL | Status: DC | PRN

## 2017-10-04 MED ORDER — DIPHENHYDRAMINE HCL 25 MG PO CAPS: 25.0000 mg | ORAL_CAPSULE | Freq: Four times a day (QID) | ORAL | Status: DC | PRN

## 2017-10-04 MED ORDER — ONDANSETRON HCL 4 MG PO TABS: 4.0000 mg | ORAL_TABLET | ORAL | Status: DC | PRN

## 2017-10-04 MED ORDER — SODIUM CHLORIDE 0.9 % IV SOLN
2.0000 g | Freq: Two times a day (BID) | INTRAVENOUS | Status: DC
  Administered 2017-10-04: 2 g via INTRAVENOUS
  Filled 2017-10-04 (×2): qty 2

## 2017-10-04 MED ORDER — NIFEDIPINE ER OSMOTIC RELEASE 30 MG PO TB24
60.0000 mg | ORAL_TABLET | Freq: Every day | ORAL | Status: DC
  Administered 2017-10-05: 60 mg via ORAL
  Filled 2017-10-04: qty 2

## 2017-10-04 MED ORDER — ZOLPIDEM TARTRATE 5 MG PO TABS: 5.0000 mg | ORAL_TABLET | Freq: Every evening | ORAL | Status: DC | PRN

## 2017-10-04 MED ORDER — NIFEDIPINE ER OSMOTIC RELEASE 30 MG PO TB24
30.0000 mg | ORAL_TABLET | Freq: Every day | ORAL | Status: DC
  Administered 2017-10-04: 30 mg via ORAL
  Filled 2017-10-04: qty 1

## 2017-10-04 MED ORDER — OXYCODONE HCL 5 MG PO TABS: 10.0000 mg | ORAL_TABLET | ORAL | Status: DC | PRN

## 2017-10-04 MED ORDER — IBUPROFEN 600 MG PO TABS
600.0000 mg | ORAL_TABLET | Freq: Four times a day (QID) | ORAL | Status: DC
  Administered 2017-10-04 – 2017-10-05 (×6): 600 mg via ORAL
  Filled 2017-10-04 (×6): qty 1

## 2017-10-04 MED ORDER — SODIUM CHLORIDE 0.9 % IV SOLN
1.0000 g | Freq: Two times a day (BID) | INTRAVENOUS | Status: DC
  Filled 2017-10-04: qty 1

## 2017-10-04 MED ORDER — PRENATAL MULTIVITAMIN CH
1.0000 | ORAL_TABLET | Freq: Every day | ORAL | Status: DC
  Administered 2017-10-04 – 2017-10-05 (×2): 1 via ORAL
  Filled 2017-10-04 (×2): qty 1

## 2017-10-04 MED ORDER — LACTATED RINGERS IV SOLN: INTRAVENOUS | Status: DC

## 2017-10-04 MED ORDER — SENNOSIDES-DOCUSATE SODIUM 8.6-50 MG PO TABS
2.0000 | ORAL_TABLET | ORAL | Status: DC
  Administered 2017-10-05: 2 via ORAL
  Filled 2017-10-04: qty 2

## 2017-10-04 MED ORDER — ACETAMINOPHEN 325 MG PO TABS: 650.0000 mg | ORAL_TABLET | ORAL | Status: DC | PRN

## 2017-10-04 MED ORDER — LORAZEPAM 1 MG PO TABS
2.0000 mg | ORAL_TABLET | Freq: Four times a day (QID) | ORAL | Status: DC | PRN
  Filled 2017-10-04: qty 2

## 2017-10-04 MED ORDER — ONDANSETRON HCL 4 MG/2ML IJ SOLN: 4.0000 mg | INTRAMUSCULAR | Status: DC | PRN

## 2017-10-04 MED ORDER — WITCH HAZEL-GLYCERIN EX PADS: 1.0000 "application " | MEDICATED_PAD | CUTANEOUS | Status: DC | PRN

## 2017-10-04 MED ORDER — SIMETHICONE 80 MG PO CHEW: 80.0000 mg | CHEWABLE_TABLET | ORAL | Status: DC | PRN

## 2017-10-04 MED ORDER — LORAZEPAM 1 MG PO TABS
1.0000 mg | ORAL_TABLET | Freq: Four times a day (QID) | ORAL | Status: DC | PRN
  Administered 2017-10-04 – 2017-10-05 (×2): 1 mg via ORAL
  Filled 2017-10-04: qty 1

## 2017-10-04 MED ORDER — OXYCODONE HCL 5 MG PO TABS: 5.0000 mg | ORAL_TABLET | ORAL | Status: DC | PRN

## 2017-10-04 MED ORDER — CEFOTETAN DISODIUM-DEXTROSE 2-2.08 GM-%(50ML) IV SOLR
2.0000 g | Freq: Two times a day (BID) | INTRAVENOUS | Status: DC
  Administered 2017-10-04: 2 g via INTRAVENOUS
  Filled 2017-10-04 (×2): qty 50

## 2017-10-04 NOTE — Progress Notes (Signed)
Patient ID: Patricia Berry, female   DOB: [birth year], 32 y.o.   MRN: [id number]   D/W pt and FOB that bradycardia to 50's and immediate pushing.  Thought that given patient's size and relative discomfort that vacuum assistance was faster to facilitate delivery then LTCS.  Also intermittent monitoring via doppler and external monitor FHT thought to be between 110's -150.  Pulse Ox on maternal finger to monitor Heart rate - baby's and maternal separate.    D/W pt scalp pH - arterial 7.11

## 2017-10-04 NOTE — Progress Notes (Signed)
I introduced spiritual care services to parents in MOB's room.  They are exhausted after a 3 day induction followed by such an unexpected and scary result.  They were somewhat guarded in their responses and they showed a mix of different emotions as they continue to process all that has happened.  They were receptive to [place] checking in on them later in the week.  [place], [place] Pager, [phone] 4:14 PM    10/04/17 1600  Clinical Encounter Type  Visited With Patient and family together  Visit Type Initial  Referral From Nurse  Spiritual Encounters  Spiritual Needs Emotional

## 2017-10-04 NOTE — Progress Notes (Signed)
Post Partum Day 0 Subjective: Pt understandably tearful given critical status of baby.  Dr. Barbaraann Rondo in room and reviewing care plan.  Baby currently weaning dopamine, stable on vent. Pt reports no pain  Objective: Blood pressure (!) 137/104, pulse 87, temperature 97.7 F (36.5 C), temperature source Oral, resp. rate 18, height 5' 4"  (1.626 m), weight (!) 350 lb 3.2 oz (158.8 kg), last menstrual period 01/08/2017, SpO2 95 %.  Physical Exam:  General: alert and cooperative Lochia: appropriate Uterine Fundus: firm, hard to palpate with habitus  Recent Labs    10/04/17 0357 10/04/17 0624  HGB 12.1 12.5  HCT 35.3* 37.2    Assessment/Plan: DOD with BP increasing this AM to 130-90-100 range.  Pt states BP had been increasing last few office visits c/w PIH but was not on medications.  She also had high BP in labor.   Started procardia XL 36m po this AM and will titrate up as needed Emotional support given regarding baby's status   LOS: 2 days   KLogan Bores4/17/2019, 10:05 AM

## 2017-10-04 NOTE — Progress Notes (Signed)
Patient ID: Patricia Berry, female   DOB: [birth year], 32 y.o.   MRN: [id number] Pt understands baby seems to be decompensating and has stated do not want chest compressions if baby codes.  Appears to have DIC and requiring maximal support to maintain BP.  Dr. Melba Coon spoke with patient and offered emotional support. Dr. Clifton James is keeping [place] updated.

## 2017-10-04 NOTE — Anesthesia Postprocedure Evaluation (Signed)
Anesthesia Post Note  Patient: Arboriculturist  Procedure(s) Performed: AN AD HOC LABOR EPIDURAL     Patient location during evaluation: Women's Unit Anesthesia Type: Epidural Level of consciousness: awake Pain management: pain level controlled Vital Signs Assessment: post-procedure vital signs reviewed and stable Respiratory status: spontaneous breathing Cardiovascular status: stable Postop Assessment: epidural receding, patient able to bend at knees, no headache and no backache Anesthetic complications: no    Last Vitals:  Vitals:   10/04/17 0753 10/04/17 0823  BP: (!) 145/100 (!) 137/104  Pulse: 92 87  Resp: 18   Temp: 36.5 C   SpO2: 95%     Last Pain:  Vitals:   10/04/17 0753  TempSrc: Oral  PainSc:    Pain Goal: Patients Stated Pain Goal: 2 (10/04/17 0629)               Everette Rank

## 2017-10-04 NOTE — Progress Notes (Signed)
   10/04/17 0823  Vital Signs  BP (!) 137/104  Dr. Marvel Plan notified of above BP.  Orders received.

## 2017-10-05 MED ORDER — NIFEDIPINE ER OSMOTIC RELEASE 30 MG PO TB24: 90.0000 mg | ORAL_TABLET | Freq: Every day | ORAL | Status: DC

## 2017-10-05 MED ORDER — IBUPROFEN 600 MG PO TABS: 600.0000 mg | ORAL_TABLET | Freq: Four times a day (QID) | ORAL | 0 refills | Status: DC

## 2017-10-05 MED ORDER — NIFEDIPINE ER OSMOTIC RELEASE 30 MG PO TB24
30.0000 mg | ORAL_TABLET | Freq: Once | ORAL | Status: AC
  Administered 2017-10-05: 30 mg via ORAL
  Filled 2017-10-05: qty 1

## 2017-10-05 MED ORDER — LORAZEPAM 1 MG PO TABS: 1.0000 mg | ORAL_TABLET | Freq: Three times a day (TID) | ORAL | 0 refills | Status: DC | PRN

## 2017-10-05 MED ORDER — NIFEDIPINE ER OSMOTIC RELEASE 90 MG PO TB24: 90.0000 mg | ORAL_TABLET | Freq: Every day | ORAL | 1 refills | Status: DC

## 2017-10-05 NOTE — Discharge Instructions (Signed)
Routine postpartum instructions

## 2017-10-05 NOTE — Progress Notes (Signed)
PPD #1 Baby expired early this morning.  Pt currently resting comfortably, no complaints Afeb, VSS, BP 130-150/80-100 Fundus firm Will continue on procardia XL 60 mg and monitor BP this morning, will try to d/c this pm if BP stable

## 2017-10-05 NOTE — Progress Notes (Signed)
Pt  Out in wheelchair   Teaching complete

## 2017-10-05 NOTE — Consult Note (Signed)
Lactation Consult:  37 3/7 weeks fetal demise LC was asked to see this mom.  Per mom pumped only 3 x's since birth and does not plan to continue pumping.  LC discussed ways to dry up her milk and gave mom the hand out and the [facility] services number.

## 2017-10-05 NOTE — Progress Notes (Signed)
CSW met MOB and FOB in room to offer resources and supports during their time of loss. When CSW arrived, MOB was resting in bed, FOB was holding MOB's hand in the recliner, and there were several other room guest present. With MOB's permission, CSW asked MOB's guest to leave in order to meet with MOB and FOB in private. CSW explained CSW's role and encouraged the family to ask questions. CSW offered condolences and shared resources that are available for them.S MOB was tearful during the meeting with CSW and expressed the need to discharge and be in the comfort of her home.  CSW validated and normalized MOB's feelings.  FOB was very supportive of MOB however, was unable to articulate his own thoughts and feelings.  FOB stated, "I just want to be here for her."  CSW validated FOB's feelings and also encouraged him to take care of his needs; FOB agreed. CSW answered the parents questions about obtaining an official birth certificate, social security card, and maxima length of stay for infant to remain in morgue.   CSW provided education regarding the baby blues period vs. perinatal mood disorders, discussed treatment and gave resources for mental health follow up if concerns arise.  CSW recommends self-evaluation during the postpartum time period using the New Mom Checklist from Postpartum Progress and encouraged MOB to contact a medical professional if symptoms are noted at any time.  CSW assessed for safety and MOB denied SI and HI.   The parents expressed having a strong support team.  CSW updated bedside nurse.  There are no barriers to d/c.  Laurey Arrow, MSW, LCSW Clinical Social Work [phone]

## 2017-10-05 NOTE — Progress Notes (Signed)
Comfort care offered. Offered to call chaplain, family (patient and significant other) refused at this time. Patient sad and tearful. Baby in room.

## 2017-10-05 NOTE — Progress Notes (Signed)
Feels fine, wants to go home BP 130-140/90-100 Will increase procardia and d/c home

## 2017-10-05 NOTE — Discharge Summary (Signed)
OB Discharge Summary     Patient Name: Patricia Berry DOB: 05-11-1986 MRN: [id number]  Date of admission: 10/02/2017 Delivering MD: Janyth Contes   Date of discharge: 10/05/2017  Admitting diagnosis: INDUCTION Intrauterine pregnancy: [redacted]w[redacted]d    Secondary diagnosis:  Principal Problem:   Status post vacuum-assisted vaginal delivery Active Problems:   Encounter for induction of labor  Additional problems: cholestasis of pregnancy, [place]    Discharge diagnosis: Term Pregnancy Delivered, Gestational Hypertension and cholestasis of pregnancy                                  Hospital course:  Induction of Labor With Vaginal Delivery   32y.o. yo G1P1001 at 343w3das admitted to the hospital 10/02/2017 for induction of labor.  Indication for induction: Gestational hypertension and Cholestasis of pregnancy.  Patient had an uncomplicated labor course as follows: Membrane Rupture Time/Date: 9:56 AM ,10/03/2017   Intrapartum Procedures: Episiotomy: None [1]                                         Lacerations:  2nd degree [3]  Patient had delivery of a Viable infant.  Information for the patient's newborn:  EmJessey, Huyett0[[id number]]Delivery Method: Vaginal, Vacuum (Extractor)(Filed from Delivery Summary)   10/04/2017  Details of delivery can be found in separate delivery note.  The baby had Apgars of 0, 0, and 2, went to the NICU and died early PPD #2.  Pt required Procardia to control BP but remained asymptomatic. Patient is discharged home 10/05/17.  Physical exam  Vitals:   10/05/17 0051 10/05/17 0543 10/05/17 0800 10/05/17 1209  BP: (!) 155/107 137/90 (!) 149/102 (!) 144/106  Pulse: (!) 102 100 95 (!) 102  Resp: (!) 24 (!) 22 20   Temp: 97.7 F (36.5 C) 98 F (36.7 C) (!) 97.5 F (36.4 C)   TempSrc: Oral Oral Oral   SpO2: 98% 95%    Weight:      Height:       General: alert Lochia: appropriate Uterine Fundus: firm  Labs: Lab Results  Component Value Date   WBC 17.7 (H) 10/04/2017   HGB 12.5 10/04/2017   HCT 37.2 10/04/2017   MCV 83.2 10/04/2017   PLT 279 10/04/2017   CMP Latest Ref Rng & Units 10/02/2017  Glucose 65 - 99 mg/dL 85  BUN 6 - 20 mg/dL 10  Creatinine 0.44 - 1.00 mg/dL 0.57  Sodium 135 - 145 mmol/L 136  Potassium 3.5 - 5.1 mmol/L 3.9  Chloride 101 - 111 mmol/L 105  CO2 22 - 32 mmol/L 21(L)  Calcium 8.9 - 10.3 mg/dL 8.6(L)  Total Protein 6.5 - 8.1 g/dL 6.1(L)  Total Bilirubin 0.3 - 1.2 mg/dL 0.4  Alkaline Phos 38 - 126 U/L 75  AST 15 - 41 U/L 14(L)  ALT 14 - 54 U/L 16    Discharge instruction: per After Visit Summary and "Baby and Me Booklet".  After visit meds:  Allergies as of 10/05/2017   No Known Allergies     Medication List    STOP taking these medications   prenatal multivitamin Tabs tablet   ursodiol 300 MG capsule Commonly known as:  ACTIGALL     TAKE these medications   ibuprofen 600 MG tablet Commonly known as:  ADVIL,MOTRIN Take 1 tablet (600 mg total) by mouth every 6 (six) hours.   LORazepam 1 MG tablet Commonly known as:  ATIVAN Take 1 tablet (1 mg total) by mouth every 8 (eight) hours as needed for anxiety.   NIFEdipine 90 MG 24 hr tablet Commonly known as:  PROCARDIA XL/ADALAT-CC Take 1 tablet (90 mg total) by mouth daily. Start taking on:  10/06/2017       Diet: routine diet  Activity: Advance as tolerated. Pelvic rest for 6 weeks.   Outpatient follow up:4 days for BP check Follow up Appt:No future appointments. Follow up Visit:No follow-ups on file.   Newborn Data: Live born female  Birth Weight: 5 lb 1.1 oz (2300 g) APGAR: 0, 0, 2  Newborn Delivery   Birth date/time:  10/04/2017 02:31:00 Delivery type:  Vaginal, Vacuum (Extractor)     Disposition:morgue   10/05/2017 Clarene Duke, MD

## 2017-12-11 ENCOUNTER — Telehealth: Payer: Self-pay | Admitting: *Deleted

## 2017-12-11 NOTE — Telephone Encounter (Signed)
Ok with me 

## 2017-12-11 NOTE — Telephone Encounter (Signed)
Copied from [place] [phone]. Topic: General - Other >> Dec 11, 2017  9:28 AM Yvette Rack wrote: Reason for CRM: pt is requesting to move from [facility] to [facility] at [place] she states that its closer to her home since shes moved

## 2017-12-12 ENCOUNTER — Ambulatory Visit: Payer: BC Managed Care – PPO | Admitting: Family Medicine

## 2017-12-12 ENCOUNTER — Encounter: Payer: Self-pay | Admitting: Family Medicine

## 2017-12-12 VITALS — BP 123/84 | HR 80 | Temp 98.1°F | Resp 20 | Ht 64.0 in | Wt 332.0 lb

## 2017-12-12 DIAGNOSIS — Z8759 Personal history of other complications of pregnancy, childbirth and the puerperium: Secondary | ICD-10-CM | POA: Diagnosis not present

## 2017-12-12 DIAGNOSIS — Z6841 Body Mass Index (BMI) 40.0 and over, adult: Secondary | ICD-10-CM

## 2017-12-12 DIAGNOSIS — Z8719 Personal history of other diseases of the digestive system: Secondary | ICD-10-CM | POA: Diagnosis not present

## 2017-12-12 DIAGNOSIS — F324 Major depressive disorder, single episode, in partial remission: Secondary | ICD-10-CM | POA: Diagnosis not present

## 2017-12-12 DIAGNOSIS — R1011 Right upper quadrant pain: Secondary | ICD-10-CM | POA: Diagnosis not present

## 2017-12-12 DIAGNOSIS — R110 Nausea: Secondary | ICD-10-CM

## 2017-12-12 DIAGNOSIS — R748 Abnormal levels of other serum enzymes: Secondary | ICD-10-CM

## 2017-12-12 HISTORY — DX: Personal history of other diseases of the digestive system: Z87.19

## 2017-12-12 HISTORY — DX: Major depressive disorder, single episode, in partial remission: F32.4

## 2017-12-12 HISTORY — DX: Personal history of other complications of pregnancy, childbirth and the puerperium: Z87.59

## 2017-12-12 LAB — CBC WITH DIFFERENTIAL/PLATELET
BASOS ABS: 0.2 10*3/uL — AB (ref 0.0–0.1)
Basophils Relative: 2.7 % (ref 0.0–3.0)
EOS ABS: 0.4 10*3/uL (ref 0.0–0.7)
Eosinophils Relative: 5.6 % — ABNORMAL HIGH (ref 0.0–5.0)
HCT: 39.9 % (ref 36.0–46.0)
Hemoglobin: 13.3 g/dL (ref 12.0–15.0)
LYMPHS ABS: 2.2 10*3/uL (ref 0.7–4.0)
Lymphocytes Relative: 29.7 % (ref 12.0–46.0)
MCHC: 33.4 g/dL (ref 30.0–36.0)
MCV: 83.1 fl (ref 78.0–100.0)
MONO ABS: 0.5 10*3/uL (ref 0.1–1.0)
MONOS PCT: 6.6 % (ref 3.0–12.0)
NEUTROS PCT: 55.4 % (ref 43.0–77.0)
Neutro Abs: 4.2 10*3/uL (ref 1.4–7.7)
Platelets: 334 10*3/uL (ref 150.0–400.0)
RBC: 4.8 Mil/uL (ref 3.87–5.11)
RDW: 14.9 % (ref 11.5–15.5)
WBC: 7.5 10*3/uL (ref 4.0–10.5)

## 2017-12-12 LAB — COMPREHENSIVE METABOLIC PANEL
ALK PHOS: 58 U/L (ref 39–117)
ALT: 82 U/L — AB (ref 0–35)
AST: 41 U/L — AB (ref 0–37)
Albumin: 4.3 g/dL (ref 3.5–5.2)
BILIRUBIN TOTAL: 0.4 mg/dL (ref 0.2–1.2)
BUN: 11 mg/dL (ref 6–23)
CO2: 31 mEq/L (ref 19–32)
Calcium: 9.7 mg/dL (ref 8.4–10.5)
Chloride: 102 mEq/L (ref 96–112)
Creatinine, Ser: 0.77 mg/dL (ref 0.40–1.20)
GFR: 93.12 mL/min (ref >60.00)
GLUCOSE: 96 mg/dL (ref 70–99)
Potassium: 4.4 mEq/L (ref 3.5–5.1)
SODIUM: 139 meq/L (ref 135–145)
TOTAL PROTEIN: 6.9 g/dL (ref 6.0–8.3)

## 2017-12-12 NOTE — Progress Notes (Signed)
Patricia Berry , 10/31/1985, 32 y.o., female MRN: [id number] Patient Care Team    Relationship Specialty Notifications Start End  Ma Hillock, DO PCP - General Family Medicine  08/01/16     Chief Complaint  Patient presents with  . Depression     Subjective: Pt presents for an OV with multiple concerns.    Right upper quadrant pain/nausea: Patient was delivered at 37 weeks on 10/04/17 for cholestasis and gestational hypertension.  Unfortunately complications arose, vacuum assisted delivery was needed and baby was born without a heartbeat.  Per mother's report he attempted neonatal resuscitation without success and "[name]" passed away.  Since that time she reports she has continued right upper quadrant pain intermittently,  occurs after meals but not consistently with any type of meal.  This occurs daily.  She endorses daily dull achy pain in her right upper quadrant since April.  Reviewed labs in April which indicated a white count of 17.7, calcium low at 8.6, albumin low at 2.6, AST low at 14, total protein low at 6.1.  No record of repeat labs since her delivery.  No record of imaging since August 04, 2017, which was reviewed today, it was completed secondary to abdominal pain during her pregnancy.  She reports at that time they want to pressure where her pain was coming from and her appendix appeared inflamed.  She did up having a laparoscopic appendectomy.  Denies fever, chills bowel changes.  Endorses nausea without vomiting.  She also endorses intermittent swelling of her left lower extremity, along with discomfort bilateral feet.  Depression: Depression screening today significant.  Patient has seen her OB/GYN which attempted to start her on Zoloft and patient has declined to start medication.  Does not want to take any chances on risks of medication in pregnancy and she is wanting to become pregnant as soon as possible.  She is working out routinely.  Attending counseling routinely.   And feels she is grieving over the loss of her daughter, but doing overall better.   Office Visit from 12/12/2017 in [place]  PHQ-9 Total Score  18      MRI abdomen/pelvis 08/04/2017: CLINICAL DATA:  Pregnant with abdominal pain. EXAM: MRI ABDOMEN AND PELVIS WITHOUT CONTRAST TECHNIQUE: Multiplanar multisequence MR imaging of the abdomen and pelvis was performed. No intravenous contrast was administered. COMPARISON:  None. FINDINGS: COMBINED FINDINGS FOR BOTH MR ABDOMEN AND PELVIS Lower chest: The lung bases are grossly clear. Hepatobiliary: No hepatic lesions. The gallbladder is mildly distended but no gallstones. Normal caliber common bile duct. Pancreas:  No mass, inflammation or ductal dilatation. Spleen:  Normal size. No focal lesions. Adrenals/Urinary Tract: The adrenal glands and kidneys are unremarkable. No significant hydronephrosis. The ureters are normal in caliber. The bladder is normal. Stomach/Bowel: The stomach, duodenum, small bowel and colon are grossly normal. There is a right lower quadrant inflammatory process. The appendix appears to be dilated and I suspect there is a proximal appendicolith. Findings highly suspicious for acute appendicitis. Vascular/Lymphatic: The major vascular structures appear normal. No mesenteric or retroperitoneal mass or adenopathy. Reproductive: Gravid uterus. The fetus is head down. No gross abnormalities. Normal appearing amniotic fluid volume. Anterior placenta appears normal. Other:  No ascites or abdominal wall hernia. Musculoskeletal: No significant bony findings. IMPRESSION: 1. MR findings highly suspicious for acute appendicitis. 2. Gravid uterus.  No worrisome MR imaging findings.   Depression screen [facility] 2/9 12/12/2017 08/01/2016  Decreased Interest 2 0  Down,  Depressed, Hopeless 3 0  PHQ - 2 Score 5 0  Altered sleeping 3 -  Tired, decreased energy 3 -  Change in appetite 2 -  Feeling bad  or failure about yourself  3 -  Trouble concentrating 2 -  Moving slowly or fidgety/restless 0 -  Suicidal thoughts 0 -  PHQ-9 Score 18 -    No Known Allergies Social History   Tobacco Use  . Smoking status: Never Smoker  . Smokeless tobacco: Never Used  Substance Use Topics  . Alcohol use: Yes    Alcohol/week: 1.2 oz    Types: 2 Glasses of wine per week   Past Medical History:  Diagnosis Date  . Cholestasis during pregnancy   . Heart disease   . Hypertension   . Status post vacuum-assisted vaginal delivery 10/04/2017  . Vitamin D deficiency    Past Surgical History:  Procedure Laterality Date  . APPENDECTOMY    . LAPAROSCOPIC APPENDECTOMY N/A 08/04/2017   Procedure: APPENDECTOMY LAPAROSCOPIC;  Surgeon: Alphonsa Overall, MD;  Location: [facility] ORS;  Service: General;  Laterality: N/A;  . NO PAST SURGERIES     Family History  Problem Relation Age of Onset  . Hyperlipidemia Mother   . Mental illness Father   . Cancer Maternal Aunt        small cell  . Heart disease Maternal Grandmother        heart attack  . Early death Maternal Grandmother   . Prostate cancer Maternal Grandfather   . Mental illness Paternal Grandmother    Allergies as of 12/12/2017   No Known Allergies     Medication List        Accurate as of 12/12/17  4:51 PM. Always use your most recent med list.          multivitamin-prenatal 27-0.8 MG Tabs tablet Take 1 tablet by mouth daily at 12 noon.   NIFEdipine 90 MG 24 hr tablet Commonly known as:  PROCARDIA XL/ADALAT-CC Take 1 tablet (90 mg total) by mouth daily.       All past medical history, surgical history, allergies, family history, immunizations andmedications were updated in the EMR today and reviewed under the history and medication portions of their EMR.     ROS: Negative, with the exception of above mentioned in HPI   Objective:  BP 123/84 (BP Location: Left Arm, Patient Position: Sitting, Cuff Size: Large)   Pulse 80   Temp 98.1 F  (36.7 C)   Resp 20   Ht _0  (1.626 m)   Wt (!) 332 lb (150.6 kg)   LMP 11/19/2016   SpO2 98%   BMI 56.99 kg/m  Body mass index is 56.99 kg/m. Gen: Afebrile. No acute distress. Nontoxic in appearance, well developed, well nourished.  Morbidly obese, very pleasant Caucasian female. HENT: AT. Strum. MMM, no oral lesions.  Eyes:Pupils Equal Round Reactive to light, Extraocular movements intact,  Conjunctiva without redness, discharge or icterus. Neck/lymp/endocrine: Supple, no lymphadenopathy CV: RRR no murmur, no edema Chest: CTAB, no wheeze or crackles. Good air movement, normal resp effort.  Abd: Soft.  Obese.ND.  Tender to palpation right upper quadrant.  Positive Murphy's sign with inspiration.  BS present.  No masses palpated. No rebound or guarding.  Skin: no rashes, purpura or petechiae.  Neuro:  Normal gait. PERLA. EOMi. Alert. Oriented x3  Psych: Appropriately tearful during difficult discussion, otherwise normal affect, dress and demeanor. Normal speech. Normal thought content and judgment.  No exam data present  No results found. Results for orders placed or performed in visit on 12/12/17 (from the past 24 hour(s))  CBC w/Diff     Status: Abnormal   Collection Time: 12/12/17  9:53 AM  Result Value Ref Range   WBC 7.5 4.0 - 10.5 K/uL   RBC 4.80 3.87 - 5.11 Mil/uL   Hemoglobin 13.3 12.0 - 15.0 g/dL   HCT 39.9 36.0 - 46.0 %   MCV 83.1 78.0 - 100.0 fl   MCHC 33.4 30.0 - 36.0 g/dL   RDW 14.9 11.5 - 15.5 %   Platelets 334.0 150.0 - 400.0 K/uL   Neutrophils Relative % 55.4 43.0 - 77.0 %   Lymphocytes Relative 29.7 12.0 - 46.0 %   Monocytes Relative 6.6 3.0 - 12.0 %   Eosinophils Relative 5.6 (H) 0.0 - 5.0 %   Basophils Relative 2.7 0.0 - 3.0 %   Neutro Abs 4.2 1.4 - 7.7 K/uL   Lymphs Abs 2.2 0.7 - 4.0 K/uL   Monocytes Absolute 0.5 0.1 - 1.0 K/uL   Eosinophils Absolute 0.4 0.0 - 0.7 K/uL   Basophils Absolute 0.2 (H) 0.0 - 0.1 K/uL  Comp Met (CMET)     Status: Abnormal    Collection Time: 12/12/17  9:53 AM  Result Value Ref Range   Sodium 139 135 - 145 mEq/L   Potassium 4.4 3.5 - 5.1 mEq/L   Chloride 102 96 - 112 mEq/L   CO2 31 19 - 32 mEq/L   Glucose, Bld 96 70 - 99 mg/dL   BUN 11 6 - 23 mg/dL   Creatinine, Ser 0.77 0.40 - 1.20 mg/dL   Total Bilirubin 0.4 0.2 - 1.2 mg/dL   Alkaline Phosphatase 58 39 - 117 U/L   AST 41 (H) 0 - 37 U/L   ALT 82 (H) 0 - 35 U/L   Total Protein 6.9 6.0 - 8.3 g/dL   Albumin 4.3 3.5 - 5.2 g/dL   Calcium 9.7 8.4 - 10.5 mg/dL   GFR 93.12 >60.00 mL/min    Assessment/Plan: Shey Yott is a 32 y.o. female present for OV for  RUQ pain/elevated liver enzymes/nausea/obesity/history of cholecystitis during pregnancy/history of fetal demise currently not pregnant Patient presents today with continued right upper quadrant pain and nausea along with intermittent lower extremity edema since April 2019 in which she was induced at 37 weeks for gestational hypertension and cholestasis in pregnancy.  Patient had  a vaginal/vacuum-assisted delivery, in which baby did not survive. - repeat labs and complete abd [place] order today.  - CBC w/Diff - Comp Met (CMET) - US Abdomen Complete; Future - if indicated will refer to GI.  Depression/history of fetal demise currently not pregnant: -See above for details.  Patient has declined start of any medicine at this time.  She is doing well in counseling.  She feels that overall she is coping.   Reviewed expectations re: course of current medical issues.  Discussed self-management of symptoms.  Outlined signs and symptoms indicating need for more acute intervention.  Patient verbalized understanding and all questions were answered.  Patient received an After-Visit Summary.    Orders Placed This Encounter  Procedures  . US Abdomen Complete  . CBC w/Diff  . Comp Met (CMET)     Note is dictated utilizing voice recognition software. Although note has been proof read prior to signing,  occasional typographical errors still can be missed. If any questions arise, please do not hesitate to call for verification.   electronically signed by:  Howard Pouch, DO  [facility] - [place]

## 2017-12-12 NOTE — Patient Instructions (Signed)
We will check labs today and get [place] of liver, we will then consider HIDA SCAN.   In the meantime, watch what your are eating and if any symptoms occur during certain foods or after meals etc.   Please help [place] help you:  We are honored you have chosen [facility] for your Primary [facility] home. Below you will find basic instructions that you may need to access in the future. Please help [place] help you by reading the instructions, which cover many of the frequent questions we experience.   Prescription refills and request:  -In order to allow more efficient response time, please call your pharmacy for all refills. They will forward the request electronically to [place]. This allows for the quickest possible response. Request left on a nurse line can take longer to refill, since these are checked as time allows between office patients and other phone calls.  - refill request can take up to 3-5 working days to complete.  - If request is sent electronically and request is appropiate, it is usually completed in 1-2 business days.  - all patients will need to be seen routinely for all chronic medical conditions requiring prescription medications (see follow-up below). If you are overdue for follow up on your condition, you will be asked to make an appointment and we will call in enough medication to cover you until your appointment (up to 30 days).  - all controlled substances will require a face to face visit to request/refill.  - if you desire your prescriptions to go through a new pharmacy, and have an active script at original pharmacy, you will need to call your pharmacy and have scripts transferred to new pharmacy. This is completed between the pharmacy locations and not by your provider.    Results: If any images or labs were ordered, it can take up to 1 week to get results depending on the test ordered and the lab/facility running and resulting the test. - Normal or stable results, which do not need  further discussion, may be released to your mychart immediately with attached note to you. A call may not be generated for normal results. Please make certain to sign up for mychart. If you have questions on how to activate your mychart you can call the front office.  - If your results need further discussion, our office will attempt to contact you via phone, and if unable to reach you after 2 attempts, we will release your abnormal result to your mychart with instructions.  - All results will be automatically released in mychart after 1 week.  - Your provider will provide you with explanation and instruction on all relevant material in your results. Please keep in mind, results and labs may appear confusing or abnormal to the untrained eye, but it does not mean they are actually abnormal for you personally. If you have any questions about your results that are not covered, or you desire more detailed explanation than what was provided, you should make an appointment with your provider to do so.   Our office handles many outgoing and incoming calls daily. If we have not contacted you within 1 week about your results, please check your mychart to see if there is a message first and if not, then contact our office.  In helping with this matter, you help decrease call volume, and therefore allow [place] to be able to respond to patients needs more efficiently.   Acute office visits (sick visit):  An  acute visit is intended for a new problem and are scheduled in shorter time slots to allow schedule openings for patients with new problems. This is the appropriate visit to discuss a new problem. Problems will not be addressed by phone call or Echart message. Appointment is needed if requesting treatment. In order to provide you with excellent quality medical care with proper time for you to explain your problem, have an exam and receive treatment with instructions, these appointments should be limited to one new  problem per visit. If you experience a new problem, in which you desire to be addressed, please make an acute office visit, we save openings on the schedule to accommodate you. Please do not save your new problem for any other type of visit, let us take care of it properly and quickly for you.   Follow up visits:  Depending on your condition(s) your provider will need to see you routinely in order to provide you with quality care and prescribe medication(s). Most chronic conditions (Example: hypertension, Diabetes, depression/anxiety... etc), require visits a couple times a year. Your provider will instruct you on proper follow up for your personal medical conditions and history. Please make certain to make follow up appointments for your condition as instructed. Failing to do so could result in lapse in your medication treatment/refills. If you request a refill, and are overdue to be seen on a condition, we will always provide you with a 30 day script (once) to allow you time to schedule.    Medicare wellness (well visit): - we have a wonderful Nurse Maudie Mercury), that will meet with you and provide you will yearly medicare wellness visits. These visits should occur yearly (can not be scheduled less than 1 calendar year apart) and cover preventive health, immunizations, advance directives and screenings you are entitled to yearly through your medicare benefits. Do not miss out on your entitled benefits, this is when medicare will pay for these benefits to be ordered for you.  These are strongly encouraged by your provider and is the appropriate type of visit to make certain you are up to date with all preventive health benefits. If you have not had your medicare wellness exam in the last 12 months, please make certain to schedule one by calling the office and schedule your medicare wellness with Maudie Mercury as soon as possible.   Yearly physical (well visit):  - Adults are recommended to be seen yearly for physicals.  Check with your insurance and date of your last physical, most insurances require one calendar year between physicals. Physicals include all preventive health topics, screenings, medical exam and labs that are appropriate for gender/age and history. You may have fasting labs needed at this visit. This is a well visit (not a sick visit), new problems should not be covered during this visit (see acute visit).  - Pediatric patients are seen more frequently when they are younger. Your provider will advise you on well child visit timing that is appropriate for your their age. - This is not a medicare wellness visit. Medicare wellness exams do not have an exam portion to the visit. Some medicare companies allow for a physical, some do not allow a yearly physical. If your medicare allows a yearly physical you can schedule the medicare wellness with our nurse Maudie Mercury and have your physical with your provider after, on the same day. Please check with insurance for your full benefits.   Late Policy/No Shows:  - all new patients should arrive 15-30  minutes earlier than appointment to allow [place] time  to  obtain all personal demographics,  insurance information and for you to complete office paperwork. - All established patients should arrive 10-15 minutes earlier than appointment time to update all information and be checked in .  - In our best efforts to run on time, if you are late for your appointment you will be asked to either reschedule or if able, we will work you back into the schedule. There will be a wait time to work you back in the schedule,  depending on availability.  - If you are unable to make it to your appointment as scheduled, please call 24 hours ahead of time to allow [place] to fill the time slot with someone else who needs to be seen. If you do not cancel your appointment ahead of time, you may be charged a no show fee.

## 2017-12-12 NOTE — Telephone Encounter (Signed)
Fine; she saw Dr.Kuneff today so probably best to finish up whatever they are working on together rather than changing during the middle of a work up.

## 2017-12-13 ENCOUNTER — Telehealth: Payer: Self-pay | Admitting: Family Medicine

## 2017-12-13 DIAGNOSIS — R110 Nausea: Secondary | ICD-10-CM

## 2017-12-13 DIAGNOSIS — R748 Abnormal levels of other serum enzymes: Secondary | ICD-10-CM

## 2017-12-13 DIAGNOSIS — R1011 Right upper quadrant pain: Secondary | ICD-10-CM

## 2017-12-13 NOTE — Telephone Encounter (Signed)
Please inform patient the following information: Her liver enzymes are elevated AST/ALT. I see her [place] is in 2 days. I have also added labs to be collected here by lab appt only in next 2 days.  Please have her schedule an appt 2 days after [place] so we can review ALL results with her in person and discuss plan.

## 2017-12-13 NOTE — Telephone Encounter (Signed)
Spoke with patient reviewed lab results and instructions. Patient verbalized understanding. Scheduled lab and follow up appts.

## 2017-12-15 ENCOUNTER — Other Ambulatory Visit (INDEPENDENT_AMBULATORY_CARE_PROVIDER_SITE_OTHER): Payer: BC Managed Care – PPO

## 2017-12-15 ENCOUNTER — Ambulatory Visit (HOSPITAL_BASED_OUTPATIENT_CLINIC_OR_DEPARTMENT_OTHER)
Admission: RE | Admit: 2017-12-15 | Discharge: 2017-12-15 | Disposition: A | Discharge status: Home / Self Care | Payer: BC Managed Care – PPO | Source: Ambulatory Visit | Attending: Family Medicine | Admitting: Family Medicine

## 2017-12-15 ENCOUNTER — Telehealth: Payer: Self-pay | Admitting: Family Medicine

## 2017-12-15 ENCOUNTER — Other Ambulatory Visit: Payer: BC Managed Care – PPO

## 2017-12-15 DIAGNOSIS — R110 Nausea: Secondary | ICD-10-CM | POA: Insufficient documentation

## 2017-12-15 DIAGNOSIS — R748 Abnormal levels of other serum enzymes: Secondary | ICD-10-CM | POA: Diagnosis not present

## 2017-12-15 DIAGNOSIS — Z8759 Personal history of other complications of pregnancy, childbirth and the puerperium: Secondary | ICD-10-CM | POA: Diagnosis not present

## 2017-12-15 DIAGNOSIS — R1011 Right upper quadrant pain: Secondary | ICD-10-CM

## 2017-12-15 DIAGNOSIS — Z8719 Personal history of other diseases of the digestive system: Secondary | ICD-10-CM | POA: Insufficient documentation

## 2017-12-15 LAB — PROTIME-INR
INR: 0.9 ratio (ref 0.8–1.0)
Prothrombin Time: 10.9 s (ref 9.6–13.1)

## 2017-12-15 NOTE — Telephone Encounter (Signed)
Patient notified and verbalized understanding. 

## 2017-12-15 NOTE — Telephone Encounter (Signed)
Please inform patient the following information: Her [place] resulted with prominent liver with Increased echogenicity consistent fatty infiltration and/or hepatocellular disease. There were no masses, gallstones or acute abnormality.  - this can mean a condition as simple as fatty liver disease, which is controlled with diet/exercise or the beginning of liver condition. We will discuss in more detail at her upcoming appt when we have all her labs to narrow down potential cause.   For now, try not to worry much, we get to the bottom of it for her. Avoid all alcohol if she consumes it and tylenol products.

## 2017-12-20 LAB — HEPATITIS PANEL, ACUTE
HEP C AB: NONREACTIVE
Hep A IgM: NONREACTIVE
Hep B C IgM: NONREACTIVE
Hepatitis B Surface Ag: NONREACTIVE
SIGNAL TO CUT-OFF: 0.01 (ref <1.00)

## 2017-12-20 LAB — ANA: Anti Nuclear Antibody(ANA): NEGATIVE

## 2017-12-20 LAB — ANTI-SMOOTH MUSCLE ANTIBODY, IGG: Actin (Smooth Muscle) Antibody (IGG): <20 U (ref <20)

## 2017-12-25 ENCOUNTER — Encounter: Payer: Self-pay | Admitting: Family Medicine

## 2017-12-25 ENCOUNTER — Encounter: Payer: Self-pay | Admitting: Gastroenterology

## 2017-12-25 ENCOUNTER — Encounter (HOSPITAL_COMMUNITY): Payer: Self-pay

## 2017-12-25 ENCOUNTER — Ambulatory Visit: Payer: BC Managed Care – PPO | Admitting: Family Medicine

## 2017-12-25 ENCOUNTER — Encounter: Payer: Self-pay | Admitting: Physician Assistant

## 2017-12-25 VITALS — BP 118/84 | HR 77 | Temp 98.5°F | Resp 20 | Ht 64.0 in | Wt 325.6 lb

## 2017-12-25 DIAGNOSIS — R112 Nausea with vomiting, unspecified: Secondary | ICD-10-CM | POA: Diagnosis not present

## 2017-12-25 DIAGNOSIS — K760 Fatty (change of) liver, not elsewhere classified: Secondary | ICD-10-CM

## 2017-12-25 DIAGNOSIS — R748 Abnormal levels of other serum enzymes: Secondary | ICD-10-CM

## 2017-12-25 DIAGNOSIS — N912 Amenorrhea, unspecified: Secondary | ICD-10-CM

## 2017-12-25 DIAGNOSIS — R1011 Right upper quadrant pain: Secondary | ICD-10-CM | POA: Diagnosis not present

## 2017-12-25 DIAGNOSIS — R110 Nausea: Secondary | ICD-10-CM

## 2017-12-25 DIAGNOSIS — Z8719 Personal history of other diseases of the digestive system: Secondary | ICD-10-CM

## 2017-12-25 DIAGNOSIS — Z8759 Personal history of other complications of pregnancy, childbirth and the puerperium: Secondary | ICD-10-CM | POA: Diagnosis not present

## 2017-12-25 LAB — POCT URINE PREGNANCY: Preg Test, Ur: NEGATIVE

## 2017-12-25 NOTE — Patient Instructions (Signed)
We have referred you to gastroenterology for further evaluation.  I have ordered the scan of your liver and gallbladder as well. They will call you with results.   I have collected additional labs today and we will call you  with results.

## 2017-12-25 NOTE — Progress Notes (Signed)
Patricia Berry , [birth year], 32 y.o., female MRN: [id number] Patient Care Team    Relationship Specialty Notifications Start End  Ma Hillock, DO PCP - General Family Medicine  08/01/16     Chief Complaint  Patient presents with  . Abdominal Pain    follow up recent results     Subjective: Pt presents for an OV with multiple concerns.    Right upper quadrant pain/nausea: Patient returns today for follow-up on her abdominal pain.  She reports the pain is still present and points to her right flank/right upper quadrant.  Reviewed labs with her in person today her liver enzymes have increased from normal in 8 weeks ago to AST 14-->41 and ALT 16-->82.  CBC was normal.  Pregnancy test negative.  ANA negative.  Hepatitis panel negative.  Ultrasound findings with increased echogenicity consistent with fatty liver infiltration and/or hepatocellular disease.  Liver appeared prominent.  Portal vein was patent and color Doppler imaging with normal directional blood flow towards the liver.   Prior note 12/12/2017: Patient was delivered at 37 weeks on 10/04/17 for cholestasis and gestational hypertension.  Unfortunately complications arose, vacuum assisted delivery was needed and baby was born without a heartbeat.  Per mother's report he attempted neonatal resuscitation without success and "[name]" passed away.  Since that time she reports she has continued right upper quadrant pain intermittently,  occurs after meals but not consistently with any type of meal.  This occurs daily.  She endorses daily dull achy pain in her right upper quadrant since April.  Reviewed labs in April which indicated a white count of 17.7, calcium low at 8.6, albumin low at 2.6, AST low at 14, total protein low at 6.1.  No record of repeat labs since her delivery.  No record of imaging since August 04, 2017, which was reviewed today, it was completed secondary to abdominal pain during her pregnancy.  She reports at that time  they want to pressure where her pain was coming from and her appendix appeared inflamed.  She did up having a laparoscopic appendectomy.  Denies fever, chills bowel changes.  Endorses nausea without vomiting.  She also endorses intermittent swelling of her left lower extremity, along with discomfort bilateral feet. CMP Latest Ref Rng & Units 12/12/2017 10/02/2017 09/24/2017  Glucose 70 - 99 mg/dL 96 85 108(H)  BUN 6 - 23 mg/dL 11 10 10   Creatinine 0.40 - 1.20 mg/dL 0.77 0.57 0.54  Sodium 135 - 145 mEq/L 139 136 136  Potassium 3.5 - 5.1 mEq/L 4.4 3.9 3.7  Chloride 96 - 112 mEq/L 102 105 107  CO2 19 - 32 mEq/L 31 21(L) 20(L)  Calcium 8.4 - 10.5 mg/dL 9.7 8.6(L) 8.6(L)  Total Protein 6.0 - 8.3 g/dL 6.9 6.1(L) 5.8(L)  Total Bilirubin 0.2 - 1.2 mg/dL 0.4 0.4 0.1(L)  Alkaline Phos 39 - 117 U/L 58 75 84  AST 0 - 37 U/L 41(H) 14(L) 17  ALT 0 - 35 U/L 82(H) 16 15   EXAM: ABDOMEN ULTRASOUND COMPLETE  COMPARISON:  MRI 12/15/2017.  FINDINGS: Gallbladder: No gallstones or wall thickening visualized. No sonographic Murphy sign noted by sonographer. Common bile duct: Diameter: 3.5 mm Liver: Increased echogenicity consistent with fatty infiltration and/or hepatocellular disease. Liver appears prominent. Portal vein is patent on color Doppler imaging with normal direction of blood flow towards the liver. IVC: No abnormality visualized. Pancreas: Visualized portion unremarkable. Spleen: Size and appearance within normal limits. Splenic calcifications, most likely granulomas. Right Kidney: Length:  11.4 cm. Echogenicity within normal limits. No mass or hydronephrosis visualized. Left Kidney: Length: 12.4 cm. Echogenicity within normal limits. No mass or hydronephrosis visualized. Abdominal aorta: No aneurysm visualized. Other findings: None. IMPRESSION: 1. Increased echogenicity consistent fatty infiltration and/or hepatocellular disease. Liver appears prominent. 2.  No acute abnormality.  No  gallstones or biliary distention.  MRI abdomen/pelvis 08/04/2017: CLINICAL DATA:  Pregnant with abdominal pain. EXAM: MRI ABDOMEN AND PELVIS WITHOUT CONTRAST TECHNIQUE: Multiplanar multisequence MR imaging of the abdomen and pelvis was performed. No intravenous contrast was administered. COMPARISON:  None. FINDINGS: COMBINED FINDINGS FOR BOTH MR ABDOMEN AND PELVIS Lower chest: The lung bases are grossly clear. Hepatobiliary: No hepatic lesions. The gallbladder is mildly distended but no gallstones. Normal caliber common bile duct. Pancreas:  No mass, inflammation or ductal dilatation. Spleen:  Normal size. No focal lesions. Adrenals/Urinary Tract: The adrenal glands and kidneys are unremarkable. No significant hydronephrosis. The ureters are normal in caliber. The bladder is normal. Stomach/Bowel: The stomach, duodenum, small bowel and colon are grossly normal. There is a right lower quadrant inflammatory process. The appendix appears to be dilated and I suspect there is a proximal appendicolith. Findings highly suspicious for acute appendicitis. Vascular/Lymphatic: The major vascular structures appear normal. No mesenteric or retroperitoneal mass or adenopathy. Reproductive: Gravid uterus. The fetus is head down. No gross abnormalities. Normal appearing amniotic fluid volume. Anterior placenta appears normal. Other:  No ascites or abdominal wall hernia. Musculoskeletal: No significant bony findings. IMPRESSION: 1. MR findings highly suspicious for acute appendicitis. 2. Gravid uterus.  No worrisome MR imaging findings.   Depression screen [facility] 2/9 12/12/2017 08/01/2016  Decreased Interest 2 0  Down, Depressed, Hopeless 3 0  PHQ - 2 Score 5 0  Altered sleeping 3 -  Tired, decreased energy 3 -  Change in appetite 2 -  Feeling bad or failure about yourself  3 -  Trouble concentrating 2 -  Moving slowly or fidgety/restless 0 -  Suicidal thoughts 0 -  PHQ-9 Score 18 -    Difficult doing work/chores Not difficult at all -    No Known Allergies Social History   Tobacco Use  . Smoking status: Never Smoker  . Smokeless tobacco: Never Used  Substance Use Topics  . Alcohol use: Yes    Alcohol/week: 1.2 oz    Types: 2 Glasses of wine per week   Past Medical History:  Diagnosis Date  . Cholestasis during pregnancy   . Heart disease   . History of cold sores   . Hypertension   . Status post vacuum-assisted vaginal delivery 23-Oct-2017   fetal death  . Vitamin D deficiency    Past Surgical History:  Procedure Laterality Date  . LAPAROSCOPIC APPENDECTOMY N/A 08/04/2017   Procedure: APPENDECTOMY LAPAROSCOPIC;  Surgeon: Alphonsa Overall, MD;  Location: [facility];  Service: General;  Laterality: N/A;   Family History  Problem Relation Age of Onset  . Hyperlipidemia Mother   . Mental illness Father   . Cancer Maternal Aunt        small cell  . Heart disease Maternal Grandmother        heart attack  . Early death Maternal Grandmother   . Prostate cancer Maternal Grandfather   . Mental illness Paternal Grandmother    Allergies as of 12/25/2017   No Known Allergies     Medication List        Accurate as of 12/25/17 11:59 PM. Always use your most recent med list.  multivitamin-prenatal 27-0.8 MG Tabs tablet Take 1 tablet by mouth daily at 12 noon.   NIFEdipine 90 MG 24 hr tablet Commonly known as:  PROCARDIA XL/ADALAT-CC Take 1 tablet (90 mg total) by mouth daily.       All past medical history, surgical history, allergies, family history, immunizations andmedications were updated in the EMR today and reviewed under the history and medication portions of their EMR.     ROS: Negative, with the exception of above mentioned in HPI   Objective:  BP 118/84 (BP Location: Left Arm, Patient Position: Sitting, Cuff Size: Large)   Pulse 77   Temp 98.5 F (36.9 C)   Resp 20   Ht 5' 4"  (1.626 m)   Wt (!) 325 lb 9.6 oz (147.7 kg)   LMP  11/19/2017   SpO2 98%   BMI 55.89 kg/m  Body mass index is 55.89 kg/m.  Gen: Afebrile. No acute distress.  Nontoxic and presentation.  Obese.  Caucasian female. HENT: AT. .  MMM.  Eyes:Pupils Equal Round Reactive to light, Extraocular movements intact,  Conjunctiva without redness, discharge or icterus. CV: RRR  Chest: CTAB, no wheeze or crackles Abd: Soft.  Obese. NTND. BS present.  No masses palpated.  Skin: No rashes, purpura or petechiae.  Neuro:  Normal gait. PERLA. EOMi. Alert. Oriented. Psych: Normal affect, dress and demeanor. Normal speech. Normal thought content and judgment.  No exam data present No results found. No results found for this or any previous visit (from the past 24 hour(s)).  Assessment/Plan: Patricia Berry is a 32 y.o. female present for OV for  RUQ pain/elevated liver enzymes/nausea/obesity/history of cholecystitis during pregnancy/history of fetal demise currently not pregnant Patient presents today for follow-up on her continued right upper quadrant pain and nausea along with intermittent lower extremity edema since April 2019 in which she was induced at 38 weeks for gestational hypertension and cholestasis in pregnancy.  Patient had  a vaginal/vacuum-assisted delivery, in which baby did not survive. -Ultrasound positive for fatty liver changes or hepatocellular disease, prominent liver.  Liver enzymes have markedly elevated since normal LFT 8 weeks ago.  Discussed all results with patient today and will move forward with HIDA scan and additional labs to rule out other viral causes.  Also referral to GI to take over management investigate causes. -HIDA scan -Torch labs -Follow-up dependent upon labs.   Reviewed expectations re: course of current medical issues.  Discussed self-management of symptoms.  Outlined signs and symptoms indicating need for more acute intervention.  Patient verbalized understanding and all questions were answered.  Patient  received an After-Visit Summary.    Orders Placed This Encounter  Procedures  . NM Hepato W/Eject Fract  . Torch-IgM (toxo/rub/cmv/hsv) w rflx titr  . Infect disease Ab IgM reflex 1  . Sedimentation rate  . C-reactive protein  . Ambulatory referral to Gastroenterology  . POCT urine pregnancy     Note is dictated utilizing voice recognition software. Although note has been proof read prior to signing, occasional typographical errors still can be missed. If any questions arise, please do not hesitate to call for verification.   electronically signed by:  Howard Pouch, DO  [place]

## 2017-12-26 ENCOUNTER — Encounter: Payer: Self-pay | Admitting: Physician Assistant

## 2017-12-27 ENCOUNTER — Encounter (HOSPITAL_COMMUNITY): Payer: Self-pay

## 2017-12-27 ENCOUNTER — Other Ambulatory Visit: Payer: Self-pay | Admitting: Family Medicine

## 2017-12-27 ENCOUNTER — Encounter: Payer: Self-pay | Admitting: Family Medicine

## 2017-12-27 DIAGNOSIS — R894 Abnormal immunological findings in specimens from other organs, systems and tissues: Secondary | ICD-10-CM

## 2017-12-27 DIAGNOSIS — R748 Abnormal levels of other serum enzymes: Secondary | ICD-10-CM

## 2017-12-27 LAB — TORCH-IGM(TOXO/ RUB/ CMV/ HSV) W TITER: HSVI/II COMB AB IGM: 1.2 ratio — AB (ref 0.00–0.90)

## 2017-12-27 LAB — INFECT DISEASE AB IGM REFLEX 1

## 2017-12-27 NOTE — Telephone Encounter (Addendum)
Attempted to call pt to discuss lab work, no answer. Left VM for her to return call. I will talk to her personally.  - HSVI/II IGM positive in the setting of abd discomfort and elevated LFT. Known h/o cold sores.  - ordered HSV 1 and II, IGM and IGG for clarification and start acyclovir until further studies are completed. (pended order until discussed with patient) - F/U 2 weeks if repeat labs are positive for IGM.  Update: spoke with patient. She understands plan. Labs tomorrow by L.APPT only and f/u 2 weeks w/ provider w/ rpt LFT at that time and review of HIDA scan results.

## 2017-12-28 ENCOUNTER — Encounter: Payer: Self-pay | Admitting: Family Medicine

## 2017-12-28 MED ORDER — ACYCLOVIR 400 MG PO TABS: 400.0000 mg | ORAL_TABLET | Freq: Three times a day (TID) | ORAL | 0 refills | Status: DC

## 2017-12-29 ENCOUNTER — Other Ambulatory Visit (INDEPENDENT_AMBULATORY_CARE_PROVIDER_SITE_OTHER): Payer: BC Managed Care – PPO

## 2017-12-29 DIAGNOSIS — R748 Abnormal levels of other serum enzymes: Secondary | ICD-10-CM | POA: Diagnosis not present

## 2017-12-29 DIAGNOSIS — R894 Abnormal immunological findings in specimens from other organs, systems and tissues: Secondary | ICD-10-CM

## 2017-12-29 LAB — HEPATIC FUNCTION PANEL
ALBUMIN: 4.1 g/dL (ref 3.5–5.2)
ALT: 69 U/L — AB (ref 0–35)
AST: 40 U/L — AB (ref 0–37)
Alkaline Phosphatase: 51 U/L (ref 39–117)
Bilirubin, Direct: 0.1 mg/dL (ref 0.0–0.3)
Total Bilirubin: 0.4 mg/dL (ref 0.2–1.2)
Total Protein: 6.4 g/dL (ref 6.0–8.3)

## 2017-12-29 LAB — SEDIMENTATION RATE: Sed Rate: 7 mm/hr (ref 0–20)

## 2017-12-29 LAB — C-REACTIVE PROTEIN: CRP: 0.3 mg/dL — AB (ref 0.5–20.0)

## 2018-01-01 ENCOUNTER — Encounter (HOSPITAL_COMMUNITY)
Admission: RE | Admit: 2018-01-01 | Discharge: 2018-01-01 | Disposition: A | Discharge status: Home / Self Care | Payer: BC Managed Care – PPO | Source: Ambulatory Visit | Attending: Family Medicine | Admitting: Family Medicine

## 2018-01-01 ENCOUNTER — Encounter (HOSPITAL_COMMUNITY): Payer: Self-pay | Admitting: Radiology

## 2018-01-01 DIAGNOSIS — K760 Fatty (change of) liver, not elsewhere classified: Secondary | ICD-10-CM | POA: Diagnosis not present

## 2018-01-01 DIAGNOSIS — R1011 Right upper quadrant pain: Secondary | ICD-10-CM | POA: Insufficient documentation

## 2018-01-01 DIAGNOSIS — R112 Nausea with vomiting, unspecified: Secondary | ICD-10-CM

## 2018-01-01 MED ORDER — TECHNETIUM TC 99M MEBROFENIN IV KIT
5.3000 | PACK | Freq: Once | INTRAVENOUS | Status: AC | PRN
  Administered 2018-01-01: 5.3 via INTRAVENOUS

## 2018-01-03 LAB — HSV(HERPES SMPLX)ABS-I+II(IGG+IGM)-BLD
HSV 1 GLYCOPROTEIN G AB, IGG: 44.8 {index} — AB (ref 0.00–0.90)
HSVI/II COMB AB IGM: 1.08 ratio — AB (ref 0.00–0.90)

## 2018-01-04 ENCOUNTER — Ambulatory Visit (HOSPITAL_COMMUNITY): Payer: BC Managed Care – PPO

## 2018-01-04 LAB — HSV 1 AND 2 IGM ABS, INDIRECT
HSV 1 IgM: <1:10 {titer}
HSV 2 IgM: <1:10 {titer}

## 2018-01-04 LAB — SPECIMEN STATUS REPORT

## 2018-01-05 ENCOUNTER — Telehealth: Payer: Self-pay | Admitting: Family Medicine

## 2018-01-05 ENCOUNTER — Encounter: Payer: Self-pay | Admitting: Family Medicine

## 2018-01-05 DIAGNOSIS — R748 Abnormal levels of other serum enzymes: Secondary | ICD-10-CM

## 2018-01-05 NOTE — Telephone Encounter (Signed)
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 - I do not see an appt schedule yet by her. If her skin lesion is resolved she can just have her LFT repeated next week after her acyclovir is completed by lab appt only. (ordered placed-IF this is the case) - If she is still having skin lesion issues, then I would like to see her with provider appt.

## 2018-01-05 NOTE — Telephone Encounter (Signed)
Left detailed message with results and instructions on patient voice mail per DPR 

## 2018-01-15 ENCOUNTER — Ambulatory Visit: Payer: BC Managed Care – PPO | Admitting: Physician Assistant

## 2018-01-15 ENCOUNTER — Encounter: Payer: Self-pay | Admitting: Physician Assistant

## 2018-01-15 ENCOUNTER — Other Ambulatory Visit (INDEPENDENT_AMBULATORY_CARE_PROVIDER_SITE_OTHER): Payer: BC Managed Care – PPO

## 2018-01-15 ENCOUNTER — Encounter

## 2018-01-15 VITALS — BP 108/78 | HR 84 | Ht 64.0 in | Wt 328.0 lb

## 2018-01-15 DIAGNOSIS — R1011 Right upper quadrant pain: Secondary | ICD-10-CM | POA: Diagnosis not present

## 2018-01-15 DIAGNOSIS — R748 Abnormal levels of other serum enzymes: Secondary | ICD-10-CM

## 2018-01-15 DIAGNOSIS — R945 Abnormal results of liver function studies: Secondary | ICD-10-CM

## 2018-01-15 DIAGNOSIS — R7989 Other specified abnormal findings of blood chemistry: Secondary | ICD-10-CM

## 2018-01-15 LAB — HEPATIC FUNCTION PANEL
ALBUMIN: 4 g/dL (ref 3.5–5.2)
ALK PHOS: 51 U/L (ref 39–117)
ALT: 55 U/L — ABNORMAL HIGH (ref 0–35)
AST: 28 U/L (ref 0–37)
BILIRUBIN DIRECT: 0.1 mg/dL (ref 0.0–0.3)
TOTAL PROTEIN: 6.4 g/dL (ref 6.0–8.3)
Total Bilirubin: 0.3 mg/dL (ref 0.2–1.2)

## 2018-01-15 LAB — BASIC METABOLIC PANEL
BUN: 11 mg/dL (ref 6–23)
CALCIUM: 9.2 mg/dL (ref 8.4–10.5)
CO2: 25 mEq/L (ref 19–32)
Chloride: 106 mEq/L (ref 96–112)
Creatinine, Ser: 0.84 mg/dL (ref 0.40–1.20)
GFR: 84.17 mL/min (ref >60.00)
Glucose, Bld: 105 mg/dL — ABNORMAL HIGH (ref 70–99)
Potassium: 4.1 mEq/L (ref 3.5–5.1)
SODIUM: 141 meq/L (ref 135–145)

## 2018-01-15 LAB — FERRITIN: FERRITIN: 10.5 ng/mL (ref 10.0–291.0)

## 2018-01-15 MED ORDER — OMEPRAZOLE 40 MG PO CPDR: DELAYED_RELEASE_CAPSULE | ORAL | 1 refills | Status: DC

## 2018-01-15 NOTE — Progress Notes (Addendum)
Subjective:    Patient ID: Patricia Berry, female    DOB: [birth year], 32 y.o.   MRN: [id number]  HPI [name] is a pleasant 32 year old white female, new to GI today referred by Dr. Raoul Pitch DO for evaluation of right-sided abdominal pain and transaminitis. Patient has not had any prior GI evaluation.  She had been in good health other than history of BPPV, obesity and depression. Patient was diagnosed with acute appendicitis in February 2019 while she was pregnant and underwent laparoscopic appendectomy.  Patient then delivered a daughter on 10/04/2017, I believe she was induced at 37 weeks due to concerns for cholestasis of pregnancy.  Unfortunately there were complications with the delivery, and the baby was delivered deceased. Review of labs from 09/30/2017 AST 17, ALT 15, alk phos 84 and T bili of 0.1 On 10/02/2017 AST 14, ALT 16, and alk phos 75 and T bili of 0.4. Patient says that she developed right upper quadrant pain she describes as being intense towards the end of May 2019.  She says this has been intermittent, and sharp in nature initially had had nausea but no vomiting.  At this point pain is not as intense but is still present on a daily basis and more dull in nature.  She has not noticed any change in pain with p.o. intake.  No fever or chills.  No injury that she is aware of.  She does relate that she had been taking high doses of ibuprofen after she had delivered and was on 500 mg twice daily in addition to Aleve p.m. over at least a couple of week.  She has not been on any NSAIDs over the past 2 months. She has been seen by primary care and had upper abdominal ultrasound on 12/15/2017 which showed no evidence of gallstones no gallbladder wall thickening, bile duct normal at 3.5 mm liver noted to have fatty infiltration and/or hepatocellular disease. Subsequent [facility] HIDA scan on 01/01/2018 normal with EF of 84%. Labs in June 2019 AST of 41 ALT of 82 and on 12/29/2017-AST 40 ALT 68, alk phos of  50 1T bili of 0.4. Other markers have been done including sed rate and CRP which were normal, ANA negative, acute hepatitis panel negative, pregnancy test negative, smooth muscle antibody negative. Patient does not drink alcohol on a regular basis, family history is negative for liver disease as far she is aware.  Review of Systems Pertinent positive and negative review of systems were noted in the above HPI section.  All other review of systems was otherwise negative.  Outpatient Encounter Medications as of 01/15/2018  Medication Sig  . omeprazole (PRILOSEC) 40 MG capsule Take 1 tab as needed every morning.  . [DISCONTINUED] acyclovir (ZOVIRAX) 400 MG tablet Take 1 tablet (400 mg total) by mouth 3 (three) times daily.  . [DISCONTINUED] Prenatal Vit-Fe Fumarate-FA (MULTIVITAMIN-PRENATAL) 27-0.8 MG TABS tablet Take 1 tablet by mouth daily at 12 noon.   No facility-administered encounter medications on file as of 01/15/2018.    No Known Allergies Patient Active Problem List   Diagnosis Date Noted  . History of fetal demise, not currently pregnant 12/12/2017  . Depression, major, single episode, in partial remission ([place]) 12/12/2017  . Elevated liver enzymes 12/12/2017  . History of cholestasis during pregnancy 12/12/2017  . Nausea 10/27/2016  . Benign paroxysmal positional vertigo 10/27/2016  . FH: heart disease 08/01/2016  . Vitamin D deficiency 10/13/2014  . BMI 50.0-59.9, adult ([place]) 04/25/2014  . History of peripheral edema  04/25/2014   Social History   Socioeconomic History  . Marital status: Married    Spouse name: Octavia Bruckner   . Number of children: 0  . Years of education: 43  . Highest education level: Not on file  Occupational History  . Occupation: Pharmacist, hospital    Comment: [employer]  Social Needs  . Financial resource strain: Not on file  . Food insecurity:    Worry: Not on file    Inability: Not on file  . Transportation needs:    Medical: Not on file     Non-medical: Not on file  Tobacco Use  . Smoking status: Never Smoker  . Smokeless tobacco: Never Used  Substance and Sexual Activity  . Alcohol use: Yes    Alcohol/week: 1.2 oz    Types: 2 Glasses of wine per week  . Drug use: No  . Sexual activity: Yes    Partners: Male    Birth control/protection: Condom    Comment: married  Lifestyle  . Physical activity:    Days per week: Not on file    Minutes per session: Not on file  . Stress: Not on file  Relationships  . Social connections:    Talks on phone: Not on file    Gets together: Not on file    Attends religious service: Not on file    Active member of club or organization: Not on file    Attends meetings of clubs or organizations: Not on file    Relationship status: Not on file  . Intimate partner violence:    Fear of current or ex partner: Not on file    Emotionally abused: Not on file    Physically abused: Not on file    Forced sexual activity: Not on file  Other Topics Concern  . Not on file  Social History Narrative   Patricia Berry lives with her husband Octavia Bruckner. She works FT as a Careers information officer, 8th grade. She enjoys traveling over seas.   She has a master's degree.   Drinks caffeine.    Daily vitamin use.    Wears her seatbelt. Smoke detector in the home.    Exercises 3x week.    Feels safe in her relationships     Patricia Berry family history includes Cancer in her maternal aunt; Early death in her maternal grandmother; Heart disease in her maternal grandmother; Hyperlipidemia in her mother; Mental illness in her father and paternal grandmother; Pancreatic cancer in her maternal grandfather; Prostate cancer in her maternal grandfather.      Objective:    Vitals:   01/15/18 0951  BP: 108/78  Pulse: 84    Physical Exam; well-developed young white female in no acute distress, pleasant blood pressure 108/78 pulse 84, height 5 foot 4, weight 328, BMI 56.3.  HEENT; nontraumatic normocephalic EOMI PERRLA  sclera anicteric, Oropharynx benign, Cardiovascular; regular rate and rhythm with S1-S2 no murmur rub or gallop, Pulmonary ;clear bilaterally, Abdomen ;obese soft, she has some tenderness in the right upper quadrant, there is no guarding or rebound no palpable mass or hepatosplenomegaly, incisional port scars from appendectomy are located in the right upper quadrant.  Rectal ;exam not done, Extremities ;no clubbing cyanosis or edema skin warm and dry, Neuro psych; alert and oriented, grossly nonfocal mood and affect appropriate       Assessment & Plan:   #71 32 year old morbidly obese white female with BMI of 56 referred for elevated LFTs, and right-sided abdominal pain over the  past 6 to 7 weeks. This is in the setting of patient being status post laparoscopic appendectomy February 2019 for acute appendicitis which occurred during late second trimester of her pregnancy.  Patient was induced on 10/04/2017 due to concerns for cholestasis, had complications with delivery and baby was delivered deceased.  Patient did not develop right upper quadrant pain until about 6 weeks postpartum.  Gallbladder imaging has been negative including [facility] HIDA scan.  Etiology of right upper quadrant pain and mild transaminitis is not clear.  I am not certain that her pain and mild elevation of LFTs is related though certainly could be. Abdominal ultrasound showed evidence of fatty liver/hepatocellular disease, and may have an element of [place] causing mild transaminitis. She also had positive HSV serology and just completed a course of acyclovir, consider associated mild hepatitis Will rule out other genetic and autoimmune liver disorders.  Consider NSAID induced duodenitis-peptic ulcer disease, and also consider right upper quadrant pain secondary to other intra-abdominal inflammatory process  Plan; Schedule for CT of the abdomen and pelvis with contrast Repeat hepatic panel today, check ferritin, alpha-1 antitrypsin  level, ceruloplasmin, antimitochondrial antibody. Advised her to continue to avoid OTC meds and alcohol Start Prilosec 40 mg p.o. every morning Patient will be established with Dr. Hilarie Fredrickson. Further plans pending results of above.  Maleeha Halls S Liboria Putnam PA-C 01/15/2018   Cc: Ma Hillock, DO  Addendum: Reviewed and agree with initial management. Pyrtle, Lajuan Lines, MD

## 2018-01-15 NOTE — Patient Instructions (Addendum)
Your provider has requested that you go to the basement level for lab work before leaving today. Press "B" on the elevator. The lab is located at the first door on the left as you exit the elevator.  We sent a prescription for Omeprazole 40 mg to [facility].    You have been scheduled for a CT scan of the abdomen and pelvis at [facility] ([address]---this is in the same building as Press [facility]).   You are scheduled on THursday 01-18-2018 at 3:30 PM. You should arrive 3:15 PM to your appointment time for registration. Please follow the written instructions below on the day of your exam:  WARNING: IF YOU ARE ALLERGIC TO IODINE/X-RAY DYE, PLEASE NOTIFY RADIOLOGY IMMEDIATELY AT [phone]! YOU WILL BE GIVEN A 13 HOUR PREMEDICATION PREP.  1) Do not eat  anything after 11:30 am (4 hours prior to your test) 2) You have been given 2 bottles of oral contrast to drink. The solution may taste better if refrigerated, but do NOT add ice or any other liquid to this solution. Shake well before drinking.    Drink 1 bottle of contrast @ 1:30 PM (2 hours prior to your exam)  Drink 1 bottle of contrast @ 2:30 PM (1 hour prior to your exam)  You may take any medications as prescribed with a small amount of water except for the following: Metformin, Glucophage, Glucovance, Avandamet, Riomet, Fortamet, Actoplus Met, Janumet, Glumetza or Metaglip. The above medications must be held the day of the exam AND 48 hours after the exam.  The purpose of you drinking the oral contrast is to aid in the visualization of your intestinal tract. The contrast solution may cause some diarrhea. Before your exam is started, you will be given a small amount of fluid to drink. Depending on your individual set of symptoms, you may also receive an intravenous injection of x-ray contrast/dye. Plan on being at [facility] for 30 minutes or long, depending on the type of exam you are having  performed.  If you have any questions regarding your exam or if you need to reschedule, you may call the CT department at [phone] between the hours of 8:00 am and 5:00 pm, Monday-Friday  If you are age 31 or younger, your body mass index should be between 19-25. Your Body mass index is 56.3 kg/m. If this is out of the aformentioned range listed, please consider follow up with your Primary Care Provider.    ________________________________________________________________________

## 2018-01-16 LAB — ALPHA-1-ANTITRYPSIN: A1 ANTITRYPSIN SER: 130 mg/dL (ref 83–199)

## 2018-01-16 LAB — CERULOPLASMIN: CERULOPLASMIN: 32 mg/dL (ref 18–53)

## 2018-01-17 LAB — MITOCHONDRIAL/SMOOTH MUSCLE AB PNL
Mitochondrial Ab: <20 Units (ref 0.0–20.0)
Smooth Muscle Ab: 3 Units (ref 0–19)

## 2018-01-18 ENCOUNTER — Ambulatory Visit (INDEPENDENT_AMBULATORY_CARE_PROVIDER_SITE_OTHER)
Admission: RE | Admit: 2018-01-18 | Discharge: 2018-01-18 | Disposition: A | Discharge status: Home / Self Care | Payer: BC Managed Care – PPO | Source: Ambulatory Visit | Attending: Physician Assistant | Admitting: Physician Assistant

## 2018-01-18 DIAGNOSIS — R945 Abnormal results of liver function studies: Secondary | ICD-10-CM | POA: Diagnosis not present

## 2018-01-18 DIAGNOSIS — R1011 Right upper quadrant pain: Secondary | ICD-10-CM

## 2018-01-18 DIAGNOSIS — R7989 Other specified abnormal findings of blood chemistry: Secondary | ICD-10-CM

## 2018-01-18 MED ORDER — IOPAMIDOL (ISOVUE-300) INJECTION 61%
100.0000 mL | Freq: Once | INTRAVENOUS | Status: AC | PRN
  Administered 2018-01-18: 100 mL via INTRAVENOUS

## 2018-01-24 ENCOUNTER — Other Ambulatory Visit: Payer: Self-pay

## 2018-01-24 DIAGNOSIS — R7989 Other specified abnormal findings of blood chemistry: Secondary | ICD-10-CM

## 2018-01-24 DIAGNOSIS — R945 Abnormal results of liver function studies: Principal | ICD-10-CM

## 2018-02-27 ENCOUNTER — Ambulatory Visit: Payer: BC Managed Care – PPO | Admitting: Gastroenterology

## 2018-03-13 ENCOUNTER — Other Ambulatory Visit (INDEPENDENT_AMBULATORY_CARE_PROVIDER_SITE_OTHER): Payer: BC Managed Care – PPO

## 2018-03-13 DIAGNOSIS — R945 Abnormal results of liver function studies: Secondary | ICD-10-CM | POA: Diagnosis not present

## 2018-03-13 DIAGNOSIS — R7989 Other specified abnormal findings of blood chemistry: Secondary | ICD-10-CM

## 2018-03-13 LAB — HEPATIC FUNCTION PANEL
ALBUMIN: 4 g/dL (ref 3.5–5.2)
ALT: 51 U/L — ABNORMAL HIGH (ref 0–35)
AST: 30 U/L (ref 0–37)
Alkaline Phosphatase: 55 U/L (ref 39–117)
Bilirubin, Direct: 0.1 mg/dL (ref 0.0–0.3)
Total Bilirubin: 0.3 mg/dL (ref 0.2–1.2)
Total Protein: 6.9 g/dL (ref 6.0–8.3)

## 2018-03-15 ENCOUNTER — Encounter: Payer: Self-pay | Admitting: *Deleted

## 2018-03-22 ENCOUNTER — Ambulatory Visit: Payer: BC Managed Care – PPO | Admitting: Internal Medicine

## 2018-03-22 ENCOUNTER — Encounter: Payer: Self-pay | Admitting: Internal Medicine

## 2018-03-22 VITALS — BP 122/74 | HR 80 | Ht 64.0 in | Wt 328.2 lb

## 2018-03-22 DIAGNOSIS — K7581 Nonalcoholic steatohepatitis (NASH): Secondary | ICD-10-CM | POA: Diagnosis not present

## 2018-03-22 DIAGNOSIS — Z6841 Body Mass Index (BMI) 40.0 and over, adult: Secondary | ICD-10-CM

## 2018-03-22 MED ORDER — VITAMIN E 180 MG (400 UNIT) PO CAPS: 800.0000 [IU] | ORAL_CAPSULE | Freq: Every day | ORAL | 5 refills | Status: DC

## 2018-03-22 NOTE — Patient Instructions (Signed)
We have sent the following medications to your pharmacy for you to pick up at your convenience: Vitamin E 800 IU daily  Make sure to eat a balanced, healthy diet and get lots of exercise! Weight loss will also help with your liver disease.  Please follow up with Dr Hilarie Fredrickson in 6 months.  If you are age 31 or older, your body mass index should be between 23-30. Your Body mass index is 56.34 kg/m. If this is out of the aforementioned range listed, please consider follow up with your Primary Care Provider.  If you are age 61 or younger, your body mass index should be between 19-25. Your Body mass index is 56.34 kg/m. If this is out of the aformentioned range listed, please consider follow up with your Primary Care Provider.

## 2018-03-22 NOTE — Progress Notes (Signed)
Subjective:    Patient ID: Patricia Berry, female    DOB: 1985-12-28, 32 y.o.   MRN: [id number]  HPI Patricia Berry is a 32 year old female with a past medical history of obesity, depression, BPPV, and recent elevated liver enzymes with fatty liver who is seen in follow-up.  She was seen by Nicoletta Ba, PA-C on 01/15/2018 to evaluate right upper quadrant pain and elevated liver enzymes.  She is here alone today.  Unfortunately she developed cholestasis of pregnancy and was taken for induction of delivery in April 4287 and complication occurred and she delivered her daughter deceased.  This pregnancy was also complicated by appendicitis and she required a laparoscopic appendectomy and week 28 of pregnancy.  She notes that during her third trimester she had issues with lower extremity edema but also hypertension.  She did gain more than 30 pounds during her pregnancy but over the summer was working out on a near daily basis in the gym and also watching her calorie intake.  She was able to lose 30 pounds but has gained about 10 pounds back since starting back to school.  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  She is not currently taking any medication though occasionally will use Tums if she has any heartburn symptoms.  Her right upper quadrant pain has improved.  Amy performed additional labs and her AST and ALT have improved, her ALT remains slightly elevated at 51, while her AST is normalized at 30.  Her alk phos is normal at 55 and total bilirubin is 0.3.  These labs performed on 03/13/2018.  Evaluation also revealed normal ANA, negative anti-smooth muscle antibody, negative antimitochondrial antibody, normal ceruloplasmin, normal alpha-1 antitrypsin.  She has had abdominal ultrasound as well as CT scan both showing steatosis.  She reports that she is always struggle with obesity and her weight.  She has tried multiple diets in the past some with success but success is not always been  sustainable.  She does not drink alcohol on a daily basis but will occasionally drink alcohol socially and if so on the weekends.   Review of Systems As per HPI, otherwise negative  Current Medications, Allergies, Past Medical History, Past Surgical History, Family History and Social History were reviewed in [facility] record.     Objective:   Physical Exam BP 122/74   Pulse 80   Ht 5' 4"  (1.626 m)   Wt (!) 328 lb 4 oz (148.9 kg)   BMI 56.34 kg/m  Constitutional: Well-developed and well-nourished. No distress. HEENT: Normocephalic and atraumatic. No scleral icterus. Extremities: no clubbing, cyanosis, or edema Neurological: Alert and oriented to person place and time. Skin: Skin is warm and dry. Psychiatric: Normal mood and affect. Behavior is normal.  Results for ANAYELY, CONSTANTINE (MRN [id number]) as of 03/22/2018 17:52  Ref. Range 09/24/2017 18:26 10/02/2017 06:49 12/12/2017 09:53 12/29/2017 08:31 01/15/2018 08:48 01/15/2018 10:59 03/13/2018 16:02  AST Latest Ref Range: 0 - 37 U/L 17 14 (L) 41 (H) 40 (H) 28  30  ALT Latest Ref Range: 0 - 35 U/L 15 16 82 (H) 69 (H) 55 (H)  51 (H)  Total Protein Latest Ref Range: 6.0 - 8.3 g/dL 5.8 (L) 6.1 (L) 6.9 6.4 6.4  6.9  Bilirubin, Direct Latest Ref Range: 0.0 - 0.3 mg/dL    0.1 0.1  0.1  Total Bilirubin Latest Ref Range: 0.2 - 1.2 mg/dL 0.1 (L) 0.4 0.4 0.4 0.3  0.3  Assessment & Plan:  32 year old female with a past medical history of obesity, depression, BPPV, and recent elevated liver enzymes with fatty liver who is seen in follow-up.  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  My suspicion is that with weight gain during pregnancy along with elevation in blood pressure that she developed steatohepatitis in the setting of chronic fatty liver disease.  Discussed this at length today.  Also her pregnancy was complicated by cholestasis of pregnancy.  I do not think she had  acute fatty liver pregnancy, as the enzyme elevation was postpartum.  We discussed all NASH at length today.  I recommended that she focus on diet, exercise and weight loss.  I also would like her to start vitamin D 800 IU daily.  It is reassuring to me that liver enzymes have been falling and are near the normal range.  Would like to see liver enzymes back to what they were previously in the teens.  I recommended she avoid over-the-counter herbal supplements.  2.  Obesity --we discussed her weight today and I encouraged her to exercise and eat a low-fat, low calorie diet as much as possible in an attempt to lose weight.  Orlistat could potentially be beneficial, we discussed this today.  We briefly discussed bariatric surgery as an option which she has not previously considered.  There are educational classes locally through the bariatric surgery department if she is interested in more information.  She will think more about this.  In the interim I recommend seeing her back in about 6 months at which point we will repeat liver enzymes.  There is no evidence of advanced liver disease or portal hypertension.  25 minutes spent with the patient today. Greater than 50% was spent in counseling and coordination of care with the patient

## 2018-04-11 ENCOUNTER — Encounter (HOSPITAL_COMMUNITY): Payer: Self-pay

## 2018-04-16 ENCOUNTER — Ambulatory Visit (HOSPITAL_COMMUNITY)
Admission: RE | Admit: 2018-04-16 | Discharge: 2018-04-16 | Disposition: A | Discharge status: Home / Self Care | Payer: BC Managed Care – PPO | Source: Ambulatory Visit | Attending: Obstetrics and Gynecology | Admitting: Obstetrics and Gynecology

## 2018-04-16 ENCOUNTER — Encounter (HOSPITAL_COMMUNITY): Payer: Self-pay

## 2018-04-16 DIAGNOSIS — Z8759 Personal history of other complications of pregnancy, childbirth and the puerperium: Secondary | ICD-10-CM | POA: Diagnosis not present

## 2018-04-16 DIAGNOSIS — K769 Liver disease, unspecified: Secondary | ICD-10-CM | POA: Diagnosis not present

## 2018-04-16 DIAGNOSIS — E669 Obesity, unspecified: Secondary | ICD-10-CM | POA: Diagnosis not present

## 2018-04-16 DIAGNOSIS — Z315 Encounter for genetic counseling: Secondary | ICD-10-CM | POA: Insufficient documentation

## 2018-04-16 NOTE — Consult Note (Signed)
Maternal-Fetal Medicine  MRN: [id number]  Requesting Provider: Thornell Sartorius, MD  I had the pleasure of seeing Ms. Whitehorn [facility] at [facility]. She was accompanied by her husband.  Obstetric history is significant for a neonatal death. She had a vacuum-assisted delivery in April 2019. She had natural conception and first and second trimsters were uneventful. She had low risk for fetal aneuploidies on screening. At 28-weeks' gestation, she had acute appendicitis and had laparoscopic appendectomy (Dr. Lucia Gaskins). Patient had uncomplicated post-operative recovery. Few days after appendectomy, she had increased blood pressures and was closely followed at your office. She did not require antihypertensives for control. At around 33-weeks' gestation, patient had itching of palms and soles and a diagnosis of cholestasis was made on clinical and lab findings. Bile acids were mildly elevated. Patient was not on any medications. She does not remember having ultrasound estimation of fetal weight in the third trimester. At [place]' gestation, she underwent induction of labor because of cholestasis and gestational hypertension. At the onset of second stage of labor, fetal bradycardia (50s) was seen and vacuum was applied to assist delivery. A female infant weighing 2,900 g (discrepancy with autopsy weight) was delivered with Apgar scores of 0/0/2. After 23 hours (with brain cooling), the infant, unfortunately, died. Fetal autopsy did not find any abnormalities. A subgaleal hemorrhage was seen. Head ultrasound performed after birth did not show intracranial hemorrhage. Serum thyroxine was increased consistent with hyperthyroidism. 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 Cord pH could not be analyzed (too low). Placenta weighed 298 g (small) with evidence of early acute chorioamnionitis.  PMH: Patient had hypertension for a month after delivery and was  taking antihypertensives for control. She has been having normal blood pressures recently. No history of diabetes. Patient had a recent visit with GI on 03/22/18 (Dr. Zenovia Jarred) because of increased liver enzymes. He feels it is consistent with nonalcoholic steatohepatitis (NASH) and advised her to take vitamin D 800 IU daily. He also counseled her obesity. PSH: Laparoscopic appendectomy. Allergies: NKDA. Medications: Vitamins. Social: Denies tobacco or drug or alcohol use. She has been married 5 years and her husband (Caucasian) has hypertension and hypercholesterolemia (on treatment). Family: No history of venous thromboembolism in the family. Gyn history: No history of cervical surgeries. Her husband uses condoms (aware of failure rates).  Labs (July/Sept): Electrolytes normal, creatinine 0.84, ALT 51, AST 30 T bil 0.3, D bil 0.1, ceruloplasmin 32 (normal), alpha-1 antirypsin normal, antimitochondrial antibody negative, smooth muscle antibody negative. Hepatitis panel neg, HIV negative. 11/2017: CBC normal. 12/15/17: Ultrasound abdomen showed increased liver echogenicity consistent with fatty infiltration and/or hepatocellular disease. Pancreas normal. No gallstones. 01/01/18: Nuclear medicine study (liver) normal. I counseled the patient on the following: History of neonatal death: We briefly discussed HIE, intrapartum insults and the possibility of hypertension contributing to fetal growth restriction. I reassured her that vacuum application is usually safe and the subgaleal hematoma would have resolved in due course. The infant also did not have intracranial hemorrhage on ultrasound. We discussed cholestasis and its management. I reassured her that induction of labor at 37 weeks was appropriate given that she had cholestasis and hypertension. A recurrence of cholestasis up to 60% has been reported. I discussed treatment with ursodeoxycholic acid, which is safe in pregnancy. I counseled the  patient on the benefit of low-dose aspirin in decreasing the likelihood of developing gestational hypertension or preeclampsia. Aspirin (81 mg) is started at 12-weeks' gestation and  continued through her pregnancy till delivery. Patient does not have contraindications to aspirin. I discussed antenatal screening for fetal aneuploidies and serial fetal growth assessments from 24 weeks till delivery in her next pregnancy. I recommend weekly antenatal testing from 32 weeks. Timing of delivery: In the absence of cholestasis or hypertension or any other risk factors, pregnancy can continue till 39 weeks.  Mode of delivery: Patient is very firm in her decision to undergo elective cesarean delivery to avoid trauma during labor. It is reasonable to perform cesarean delivery. I briefly discussed increased complications of cesarean section (hemorrhage and infection).  Liver disease: Patient has mild chronic hepatic disease and she is being followed by GI. In the absence of clear risk factors (alcohol), I do not expect adverse pregnancy outcomes. Falling liver enzymes are encouraging. It is unclear whether this could have contributed to cholestasis. However, if the patient develops cholestasis, ursodeoxycholic acid treatment is appropriate. She does not appear to have biliary cirrhosis (normal AMA).  Obesity: Her GI discussed bariatric surgery. I briefly counseled the patient on bariatric surgery that reduces the complications of gestational diabetes or hypertension. However, fertility is not an issue and her obesity has not contributed to her stillbirth. Patient is not keen on bariatric surgery now. I counseled her that obesity is associated with increased likelihood of gestational hypertension and gestational diabetes. Preconception folic acid: I strongly encouraged her to continue prenatal vitamins. Folic acid should be taken at least a month before conception and it is associated with a lower incidence of  open-neural tube defects and possibly other congenital malformations as well. Recommendations: -Preconception folic acid 413 micrograms daily or vitamins. -Early pregnancy ultrasound for dating. -Fetal anatomy scan at 20 weeks. -Serial growth assessments every 4 weesk. -Antenatal testing from 32 weeks till delivery. -Elective cesarean section at 39 weeks if no other complications are present.  Thank you for your consult. Please do not hesitate to contact me if you have any questions or concerns.  Consultation including face-to-face counseling: 40 min.

## 2018-04-16 NOTE — ED Notes (Signed)
BP 119/76, P 77. Patient presents for MD consult.

## 2018-06-26 ENCOUNTER — Encounter (HOSPITAL_COMMUNITY): Payer: Self-pay

## 2018-07-05 ENCOUNTER — Encounter: Payer: Self-pay | Admitting: Family Medicine

## 2018-07-06 LAB — HEMOGLOBIN A1C: Hemoglobin A1C: 5.2

## 2018-07-06 LAB — OB RESULTS CONSOLE ABO/RH: RH Type: POSITIVE

## 2018-07-06 LAB — OB RESULTS CONSOLE RUBELLA ANTIBODY, IGM: Rubella: IMMUNE

## 2018-07-06 LAB — OB RESULTS CONSOLE HIV ANTIBODY (ROUTINE TESTING): HIV: NONREACTIVE

## 2018-07-06 LAB — CBC AND DIFFERENTIAL
Hemoglobin: 13.2 (ref 12.0–16.0)
Platelets: 326 (ref 150–399)

## 2018-07-06 LAB — HM PAP SMEAR

## 2018-07-06 LAB — OB RESULTS CONSOLE GC/CHLAMYDIA
Chlamydia: NEGATIVE
Gonorrhea: NEGATIVE

## 2018-07-06 LAB — OB RESULTS CONSOLE RPR: RPR: NONREACTIVE

## 2018-07-06 LAB — OB RESULTS CONSOLE ANTIBODY SCREEN: Antibody Screen: NEGATIVE

## 2018-07-06 LAB — OB RESULTS CONSOLE HEPATITIS B SURFACE ANTIGEN: Hepatitis B Surface Ag: NEGATIVE

## 2018-07-09 ENCOUNTER — Other Ambulatory Visit (HOSPITAL_COMMUNITY): Payer: Self-pay | Admitting: Obstetrics and Gynecology

## 2018-07-09 DIAGNOSIS — Z369 Encounter for antenatal screening, unspecified: Secondary | ICD-10-CM

## 2018-07-13 ENCOUNTER — Encounter (HOSPITAL_COMMUNITY): Payer: Self-pay

## 2018-07-17 ENCOUNTER — Encounter (HOSPITAL_COMMUNITY): Payer: Self-pay | Admitting: *Deleted

## 2018-07-19 ENCOUNTER — Encounter (HOSPITAL_COMMUNITY): Payer: Self-pay

## 2018-07-19 ENCOUNTER — Ambulatory Visit (HOSPITAL_COMMUNITY)
Admission: RE | Admit: 2018-07-19 | Discharge: 2018-07-19 | Disposition: A | Discharge status: Home / Self Care | Payer: BC Managed Care – PPO | Source: Ambulatory Visit | Attending: Family Medicine | Admitting: Family Medicine

## 2018-07-19 ENCOUNTER — Ambulatory Visit (HOSPITAL_COMMUNITY)
Admission: RE | Admit: 2018-07-19 | Discharge: 2018-07-19 | Disposition: A | Discharge status: Home / Self Care | Payer: BC Managed Care – PPO | Source: Ambulatory Visit | Attending: Obstetrics and Gynecology | Admitting: Obstetrics and Gynecology

## 2018-07-19 DIAGNOSIS — Z3682 Encounter for antenatal screening for nuchal translucency: Secondary | ICD-10-CM

## 2018-07-19 DIAGNOSIS — Z3A12 12 weeks gestation of pregnancy: Secondary | ICD-10-CM | POA: Diagnosis not present

## 2018-07-19 DIAGNOSIS — Z369 Encounter for antenatal screening, unspecified: Secondary | ICD-10-CM | POA: Diagnosis not present

## 2018-07-26 ENCOUNTER — Other Ambulatory Visit (HOSPITAL_COMMUNITY): Payer: Self-pay | Admitting: Obstetrics and Gynecology

## 2018-08-07 LAB — HM HIV SCREENING LAB: HM HIV Screening: NEGATIVE

## 2018-08-09 ENCOUNTER — Encounter: Payer: Self-pay | Admitting: Family Medicine

## 2018-08-14 IMAGING — CT CT ABD-PELV W/ CM
2 of 4 series · 16 of 46 positions shown, 18 images · IV contrast (iopamidol)
Comparison: 08/04/2017

CLINICAL DATA: Right upper quadrant pain for 2 months. Elevated
liver function tests.

EXAM:
CT ABDOMEN AND PELVIS WITH CONTRAST
TECHNIQUE: Multidetector CT imaging of the abdomen and pelvis was performed
using the standard protocol following bolus administration of
intravenous contrast.
CONTRAST:  100mL [id number] IOPAMIDOL ([id number]) INJECTION 61%

[Series 2: abd/pel w · axial · 0.98mm/px · z∈[+869,+1334]mm · 13 of 103 slices shown, 15 images]
[im 5/103  soft-tissue]
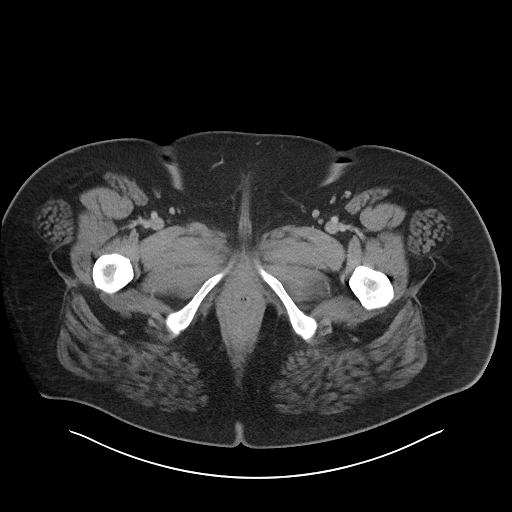
[im 5/103  bone]
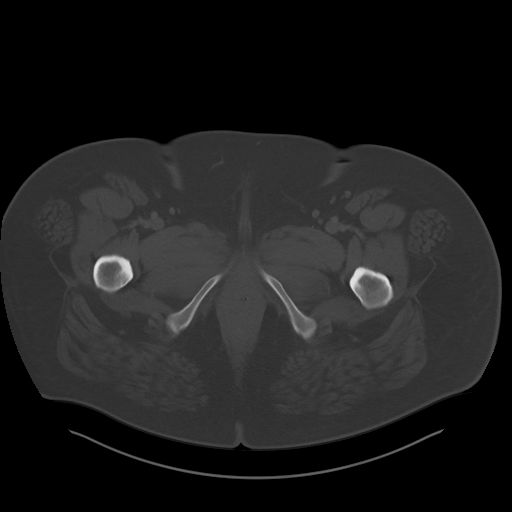
[im 13/103  soft-tissue]
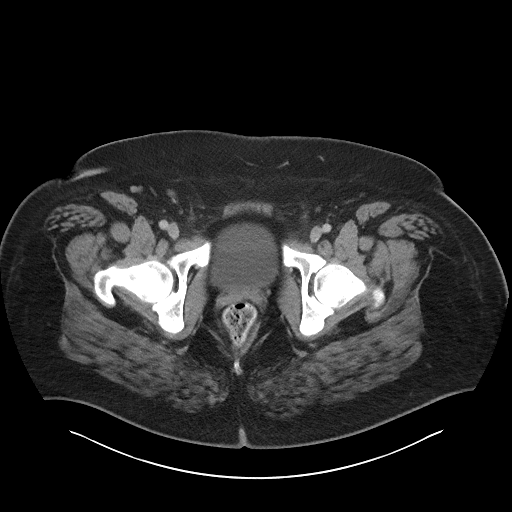
[im 22/103  soft-tissue]
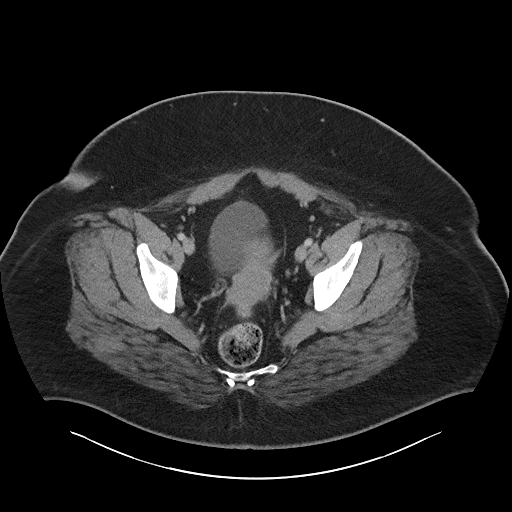
[im 30/103  soft-tissue]
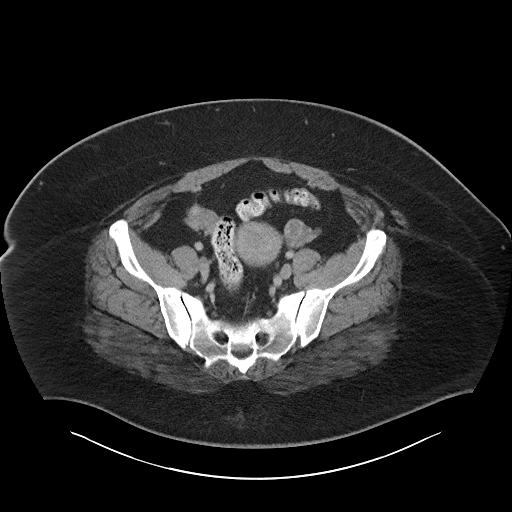
[im 35/103  soft-tissue]
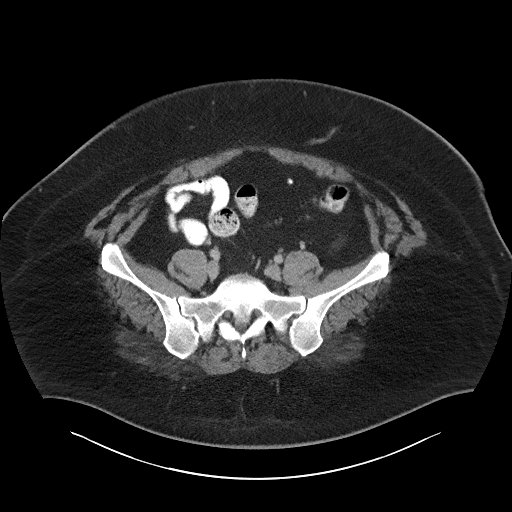
[im 43/103  soft-tissue]
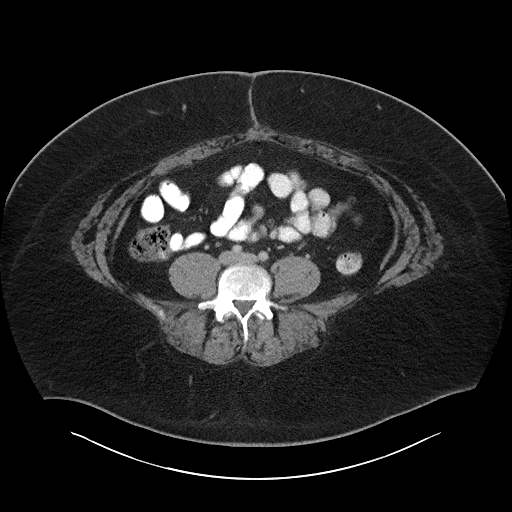
[im 52/103  soft-tissue]
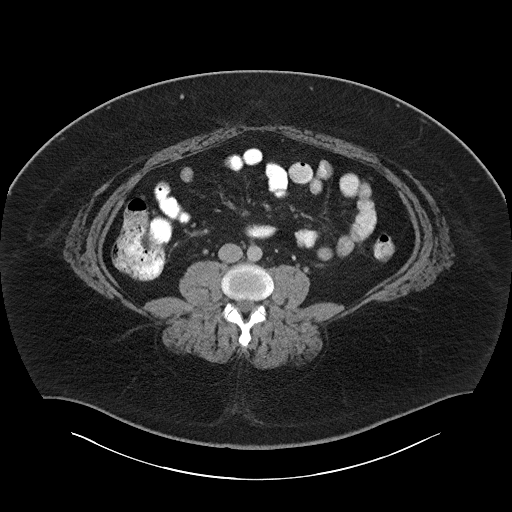
[im 60/103  soft-tissue]
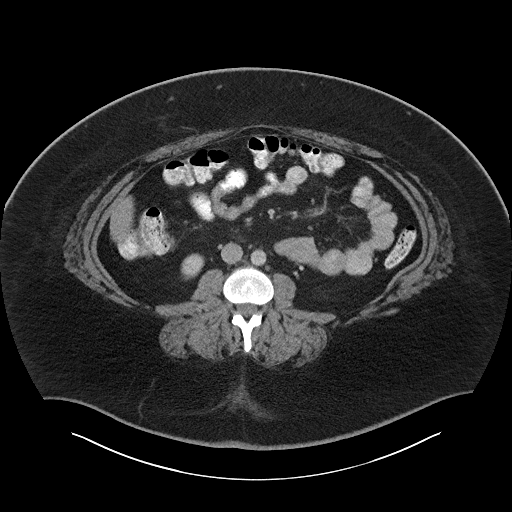
[im 69/103  soft-tissue]
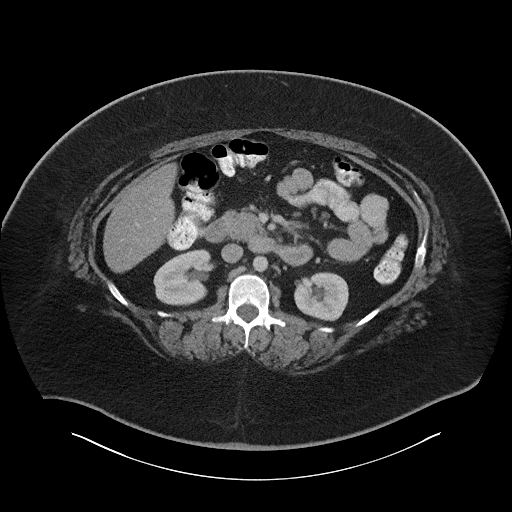
[im 69/103  bone]
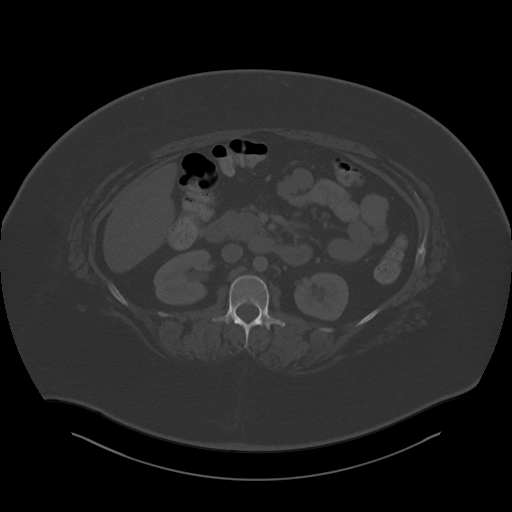
[im 73/103  soft-tissue]
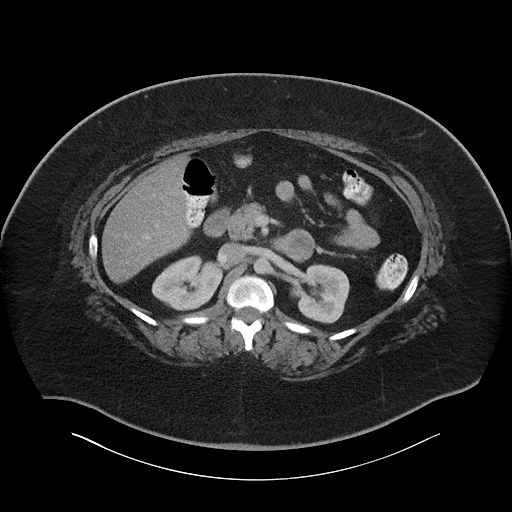
[im 81/103  soft-tissue]
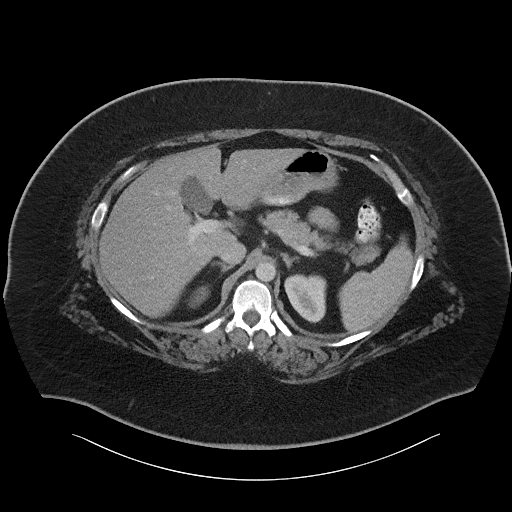
[im 90/103  soft-tissue]
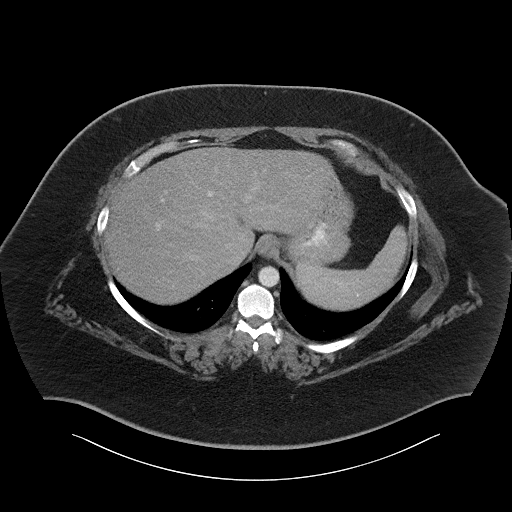
[im 98/103  soft-tissue]
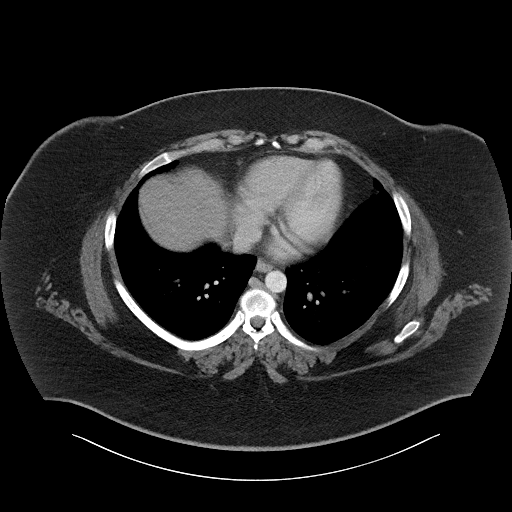

[Series 5: abd/pel w st · coronal · 1.01mm/px · 3 of 124 slices shown]
[im 42/124  soft-tissue]
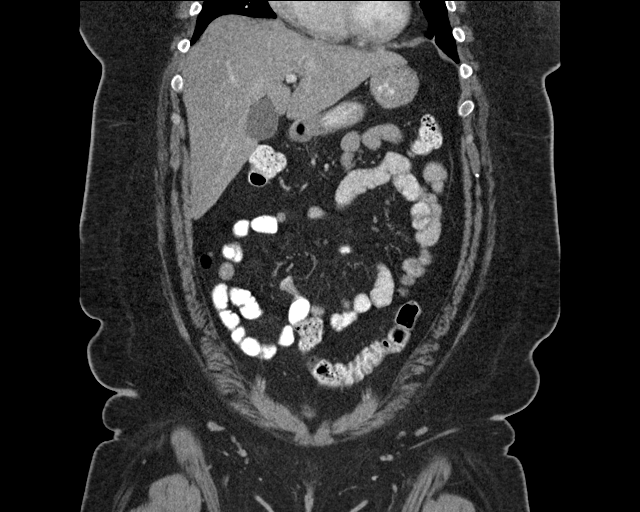
[im 55/124  soft-tissue]
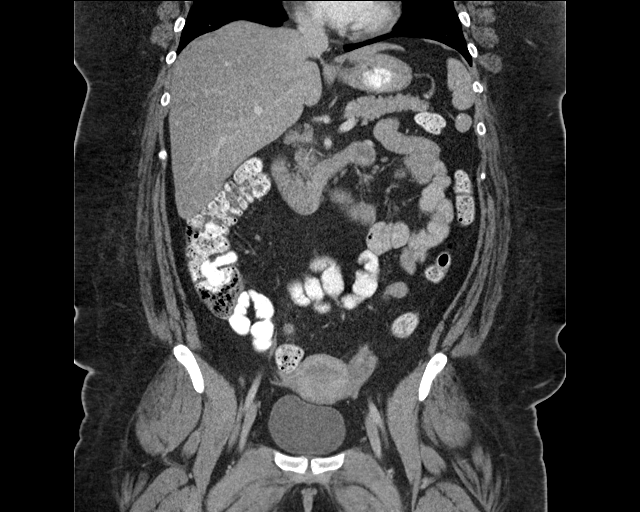
[im 69/124  soft-tissue]
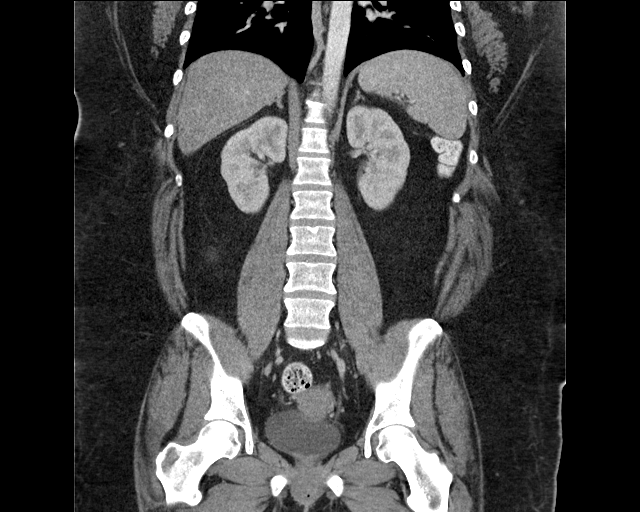

[16 of 46 positions shown; findings below may reference images not displayed]

FINDINGS: Lower Chest: No acute findings.

Hepatobiliary: No hepatic masses identified. Mild diffuse hepatic
steatosis. Gallbladder is unremarkable. No evidence of biliary
ductal dilatation.

Pancreas:  No mass or inflammatory changes.

Spleen: Within normal limits in size and appearance.

Adrenals/Urinary Tract: No masses identified. No evidence of
hydronephrosis.

Stomach/Bowel: No evidence of obstruction, inflammatory process or
abnormal fluid collections.

Vascular/Lymphatic: No pathologically enlarged lymph nodes. No
abdominal aortic aneurysm.

Reproductive:  No mass or other significant abnormality.

Other:  None.

Musculoskeletal:  No suspicious bone lesions identified.
IMPRESSION: Mild diffuse hepatic steatosis. No other hepatobiliary abnormality
or acute findings.

## 2018-10-05 ENCOUNTER — Encounter: Payer: Self-pay | Admitting: Family Medicine

## 2018-10-08 ENCOUNTER — Other Ambulatory Visit (HOSPITAL_COMMUNITY): Payer: Self-pay | Admitting: Obstetrics and Gynecology

## 2018-10-08 DIAGNOSIS — O99212 Obesity complicating pregnancy, second trimester: Secondary | ICD-10-CM

## 2018-10-10 ENCOUNTER — Encounter: Payer: Self-pay | Admitting: Internal Medicine

## 2018-10-16 ENCOUNTER — Encounter (HOSPITAL_COMMUNITY): Payer: Self-pay

## 2018-10-18 ENCOUNTER — Other Ambulatory Visit (HOSPITAL_COMMUNITY): Payer: Self-pay

## 2018-10-18 ENCOUNTER — Ambulatory Visit (HOSPITAL_COMMUNITY): Payer: BC Managed Care – PPO | Admitting: *Deleted

## 2018-10-18 ENCOUNTER — Other Ambulatory Visit: Payer: Self-pay

## 2018-10-18 ENCOUNTER — Ambulatory Visit (HOSPITAL_COMMUNITY)
Admission: RE | Admit: 2018-10-18 | Discharge: 2018-10-18 | Disposition: A | Discharge status: Home / Self Care | Payer: BC Managed Care – PPO | Source: Ambulatory Visit | Attending: Obstetrics and Gynecology | Admitting: Obstetrics and Gynecology

## 2018-10-18 ENCOUNTER — Encounter (HOSPITAL_COMMUNITY): Payer: Self-pay

## 2018-10-18 VITALS — Temp 98.4°F

## 2018-10-18 DIAGNOSIS — O0990 Supervision of high risk pregnancy, unspecified, unspecified trimester: Secondary | ICD-10-CM | POA: Diagnosis present

## 2018-10-18 DIAGNOSIS — Z3A25 25 weeks gestation of pregnancy: Secondary | ICD-10-CM

## 2018-10-18 DIAGNOSIS — O09212 Supervision of pregnancy with history of pre-term labor, second trimester: Secondary | ICD-10-CM

## 2018-10-18 DIAGNOSIS — O99212 Obesity complicating pregnancy, second trimester: Secondary | ICD-10-CM | POA: Diagnosis present

## 2018-10-18 DIAGNOSIS — Z363 Encounter for antenatal screening for malformations: Secondary | ICD-10-CM

## 2018-10-18 DIAGNOSIS — O09292 Supervision of pregnancy with other poor reproductive or obstetric history, second trimester: Secondary | ICD-10-CM

## 2018-10-18 DIAGNOSIS — O10012 Pre-existing essential hypertension complicating pregnancy, second trimester: Secondary | ICD-10-CM

## 2018-10-18 DIAGNOSIS — O2692 Pregnancy related conditions, unspecified, second trimester: Secondary | ICD-10-CM

## 2018-10-25 ENCOUNTER — Encounter: Payer: Self-pay | Admitting: *Deleted

## 2018-10-29 ENCOUNTER — Ambulatory Visit (INDEPENDENT_AMBULATORY_CARE_PROVIDER_SITE_OTHER): Payer: BC Managed Care – PPO | Admitting: Internal Medicine

## 2018-10-29 ENCOUNTER — Other Ambulatory Visit: Payer: Self-pay

## 2018-10-29 ENCOUNTER — Encounter: Payer: Self-pay | Admitting: Internal Medicine

## 2018-10-29 VITALS — Ht 64.0 in | Wt 328.0 lb

## 2018-10-29 DIAGNOSIS — K760 Fatty (change of) liver, not elsewhere classified: Secondary | ICD-10-CM | POA: Diagnosis not present

## 2018-10-29 DIAGNOSIS — Z6841 Body Mass Index (BMI) 40.0 and over, adult: Secondary | ICD-10-CM

## 2018-10-29 DIAGNOSIS — O0001 Abdominal pregnancy with intrauterine pregnancy: Secondary | ICD-10-CM | POA: Diagnosis not present

## 2018-10-29 NOTE — Progress Notes (Signed)
Subjective:    Patient ID: Patricia Berry, female    DOB: 1985/08/04, 33 y.o.   MRN: [id number] This service was provided via telemedicine.  Doximity platform with A/V communication The patient was located at home The provider was located in provider's GI office. The patient did consent to this telephone visit and is aware of possible charges through their insurance for this visit.   The persons participating in this telemedicine service were the patient and I. Time spent on call: 14 minutes   HPI Patricia Berry is a 33 year old female with a past medical history of nonalcoholic fatty liver disease, obesity, history of preeclampsia, history of fetal death who is seen in follow-up.  She was last seen in October 2019 and is seen virtually today in the setting of COVID-19.  At the time of her last visit we discussed nonalcoholic fatty liver disease and steatohepatitis.  We recommended that she begin vitamin E therapy and increase diet and exercise in an attempt to lose weight.  She was very successful in doing so and lost a total of 50 pounds between July and December 2019.  She went from 352 to a weight of 302.  Fortunately she and her husband were able to conceive a child and she is currently [redacted] weeks pregnant.  She reports that she was able to lose weight by eating healthy, working out and going to the gym 5 days a week.  With COVID-19 restriction she is no longer going to the gym but is working out using videos with her husband at home.  She has slowly gained weight during pregnancy despite trying to not gain weight.  She is gained 20 to 30 pounds in the last 27 weeks of pregnancy.  She has been feeling well.  Her maternal-fetal specialist asked that she stop vitamin E therapy and so this is been on hold.  She is getting vitamin D supplementation to a lesser degree with her prenatal vitamin.  She has been feeling well.  She denies abdominal pain.  No itching.  No jaundice.  No issues with  swelling.  Her liver enzymes have been followed very closely throughout pregnancy and she reports they have been normal.  I was able through the computer to see a CMP which was drawn on 10/11/2018.  AST was 10, ALT 12, total bilirubin less than 0.2, alkaline phosphatase 71, albumin 3.5.  Serum creatinine 0.53.  She notes that her urine protein has become slightly elevated and they are watching this.  She has not had any issues with hypertension as yet in this pregnancy.    Review of Systems As per HPI, otherwise negative  Current Medications, Allergies, Past Medical History, Past Surgical History, Family History and Social History were reviewed in [employer] record.     Objective:   Physical Exam No physical exam, virtual visit  See HPI for labs     Assessment & Plan:  33 year old female with a past medical history of nonalcoholic fatty liver disease, obesity, history of preeclampsia, history of fetal death who is seen in follow-up.   1.  Nonalcoholic fatty liver disease --she was very successful at losing 50 pounds which was between 15 and 20% of her body weight.  I congratulated her on this significant weight reduction.  Through diet and exercise she was able to lose weight and normalize her liver enzymes.  She is working hard to try to gain minimal weight during her pregnancy.  Liver enzymes were checked  very recently and normal which is very reassuring.  Given her history there is considerable anxiety particular in the third trimester but she is being followed closely by maternal-fetal medicine given her elevated risk with her obesity. --Continue diet and exercise throughout and after pregnancy --Vitamin E on hold per maternal-fetal medicine, would recommend resuming if okay with maternal-fetal medicine at 800 international units daily after delivery --Goal is for slow progressive weight loss until BMI is less than 30, we previously discussed bariatric surgery  however if she is able to lose weight in the more traditional manner and her liver enzymes remain normal and she may not need a bariatric surgery. --I would like to see her back in about 4 months which should be about a month after she delivers for follow-up --If her liver enzymes become elevated during pregnancy I made her aware that she or her maternal-fetal medicine provider can contact me directly

## 2018-10-29 NOTE — Patient Instructions (Signed)
Continue diet and exercise throughout and after pregnancy.  Vitamin E will remain on hold per maternal-fetal medicine. I would recommend resuming if okay with maternal-fetal medicine at 800 international units daily after delivery.  Please follow up with Dr Hilarie Fredrickson in 4 months.  If your liver enzymes become elevated during pregnancy, either you or your maternal-fetal medicine provider can contact me directly.  Congratulations on your upcoming new addition to the family!

## 2018-10-31 ENCOUNTER — Encounter: Payer: Self-pay | Admitting: Family Medicine

## 2019-01-07 ENCOUNTER — Encounter (HOSPITAL_COMMUNITY): Payer: Self-pay

## 2019-01-08 ENCOUNTER — Telehealth: Payer: Self-pay | Admitting: Cardiovascular Disease

## 2019-01-08 ENCOUNTER — Encounter (HOSPITAL_COMMUNITY): Payer: Self-pay

## 2019-01-08 NOTE — Telephone Encounter (Signed)
Lm for recall

## 2019-01-08 NOTE — Patient Instructions (Signed)
Patricia Berry  01/08/2019   Your procedure is scheduled on:  01/21/2019  Arrive at 74 at Entrance C on [address] at [facility]  and [address]. You are invited to use the FREE valet parking or use the Visitor's parking deck.  Pick up the phone at the desk and dial [phone].  Call this number if you have problems the morning of surgery: [phone]  Remember:   Do not eat food:(After Midnight) Desps de medianoche.  Do not drink clear liquids: (After Midnight) Desps de medianoche.  Take these medicines the morning of surgery with A SIP OF WATER:  May take pepcid   Do not wear jewelry, make-up or nail polish.  Do not wear lotions, powders, or perfumes. Do not wear deodorant.  Do not shave 48 hours prior to surgery.  Do not bring valuables to the hospital.  [facility] is not   responsible for any belongings or valuables brought to the hospital.  Contacts, dentures or bridgework may not be worn into surgery.  Leave suitcase in the car. After surgery it may be brought to your room.  For patients admitted to the hospital, checkout time is 11:00 AM the day of              discharge.      Please read over the following fact sheets that you were given:     Preparing for Surgery

## 2019-01-10 ENCOUNTER — Encounter: Payer: Self-pay | Admitting: Family Medicine

## 2019-01-10 ENCOUNTER — Encounter (HOSPITAL_COMMUNITY): Payer: Self-pay | Admitting: *Deleted

## 2019-01-10 ENCOUNTER — Inpatient Hospital Stay (HOSPITAL_COMMUNITY): Admission: RE | Admit: 2019-01-10 | Payer: BC Managed Care – PPO | Admitting: Obstetrics and Gynecology

## 2019-01-10 ENCOUNTER — Other Ambulatory Visit: Payer: Self-pay

## 2019-01-10 ENCOUNTER — Inpatient Hospital Stay (HOSPITAL_COMMUNITY): Payer: BC Managed Care – PPO | Admitting: Anesthesiology

## 2019-01-10 ENCOUNTER — Inpatient Hospital Stay (HOSPITAL_COMMUNITY)
Admission: AD | Admit: 2019-01-10 | Discharge: 2019-01-13 | DRG: 788 | Disposition: A | Discharge status: Home / Self Care | Payer: BC Managed Care – PPO | Attending: Obstetrics and Gynecology | Admitting: Obstetrics and Gynecology

## 2019-01-10 ENCOUNTER — Encounter (HOSPITAL_COMMUNITY)
Admission: AD | Disposition: A | Discharge status: Home / Self Care | Payer: Self-pay | Source: Home / Self Care | Attending: Obstetrics and Gynecology

## 2019-01-10 DIAGNOSIS — O26893 Other specified pregnancy related conditions, third trimester: Secondary | ICD-10-CM | POA: Diagnosis present

## 2019-01-10 DIAGNOSIS — O99214 Obesity complicating childbirth: Principal | ICD-10-CM | POA: Diagnosis present

## 2019-01-10 DIAGNOSIS — Z98891 History of uterine scar from previous surgery: Secondary | ICD-10-CM

## 2019-01-10 DIAGNOSIS — Z3493 Encounter for supervision of normal pregnancy, unspecified, third trimester: Secondary | ICD-10-CM

## 2019-01-10 DIAGNOSIS — Z20828 Contact with and (suspected) exposure to other viral communicable diseases: Secondary | ICD-10-CM | POA: Diagnosis present

## 2019-01-10 DIAGNOSIS — Z87898 Personal history of other specified conditions: Secondary | ICD-10-CM

## 2019-01-10 DIAGNOSIS — Z6841 Body Mass Index (BMI) 40.0 and over, adult: Secondary | ICD-10-CM

## 2019-01-10 DIAGNOSIS — Z3A37 37 weeks gestation of pregnancy: Secondary | ICD-10-CM | POA: Diagnosis not present

## 2019-01-10 HISTORY — DX: History of uterine scar from previous surgery: Z98.891

## 2019-01-10 LAB — CBC
HCT: 36.6 % (ref 36.0–46.0)
Hemoglobin: 12.4 g/dL (ref 12.0–15.0)
MCH: 28.8 pg (ref 26.0–34.0)
MCHC: 33.9 g/dL (ref 30.0–36.0)
MCV: 84.9 fL (ref 80.0–100.0)
Platelets: 265 10*3/uL (ref 150–400)
RBC: 4.31 MIL/uL (ref 3.87–5.11)
RDW: 14.7 % (ref 11.5–15.5)
WBC: 12.6 10*3/uL — ABNORMAL HIGH (ref 4.0–10.5)
nRBC: 0 % (ref 0.0–0.2)

## 2019-01-10 LAB — TYPE AND SCREEN
ABO/RH(D): B POS
Antibody Screen: NEGATIVE

## 2019-01-10 LAB — SARS CORONAVIRUS 2 BY RT PCR (HOSPITAL ORDER, PERFORMED IN ~~LOC~~ HOSPITAL LAB): SARS Coronavirus 2: NEGATIVE

## 2019-01-10 SURGERY — Surgical Case
Anesthesia: Spinal | Site: Abdomen | Wound class: Clean Contaminated

## 2019-01-10 MED ORDER — ONDANSETRON HCL 4 MG/2ML IJ SOLN
INTRAMUSCULAR | Status: DC | PRN
  Administered 2019-01-10: 4 mg via INTRAVENOUS

## 2019-01-10 MED ORDER — PHENYLEPHRINE HCL (PRESSORS) 10 MG/ML IV SOLN
INTRAVENOUS | Status: DC | PRN
  Administered 2019-01-10: 40 ug via INTRAVENOUS

## 2019-01-10 MED ORDER — ACETAMINOPHEN 500 MG PO TABS
1000.0000 mg | ORAL_TABLET | Freq: Four times a day (QID) | ORAL | Status: AC
  Administered 2019-01-11 (×3): 1000 mg via ORAL
  Filled 2019-01-10 (×3): qty 2

## 2019-01-10 MED ORDER — HYDROMORPHONE HCL 1 MG/ML IJ SOLN: 0.2500 mg | INTRAMUSCULAR | Status: DC | PRN

## 2019-01-10 MED ORDER — OXYTOCIN 40 UNITS IN NORMAL SALINE INFUSION - SIMPLE MED: 2.5000 [IU]/h | INTRAVENOUS | Status: AC

## 2019-01-10 MED ORDER — METOCLOPRAMIDE HCL 5 MG/ML IJ SOLN: 10.0000 mg | Freq: Once | INTRAMUSCULAR | Status: DC

## 2019-01-10 MED ORDER — PROMETHAZINE HCL 25 MG/ML IJ SOLN: 6.2500 mg | INTRAMUSCULAR | Status: DC | PRN

## 2019-01-10 MED ORDER — IBUPROFEN 800 MG PO TABS
800.0000 mg | ORAL_TABLET | Freq: Three times a day (TID) | ORAL | Status: DC
  Administered 2019-01-11 – 2019-01-13 (×7): 800 mg via ORAL
  Filled 2019-01-10 (×7): qty 1

## 2019-01-10 MED ORDER — MORPHINE SULFATE (PF) 0.5 MG/ML IJ SOLN
INTRAMUSCULAR | Status: AC
  Filled 2019-01-10: qty 10

## 2019-01-10 MED ORDER — KETOROLAC TROMETHAMINE 30 MG/ML IJ SOLN
INTRAMUSCULAR | Status: AC
  Filled 2019-01-10: qty 1

## 2019-01-10 MED ORDER — PHENYLEPHRINE HCL-NACL 20-0.9 MG/250ML-% IV SOLN
INTRAVENOUS | Status: AC
  Filled 2019-01-10: qty 250

## 2019-01-10 MED ORDER — DIPHENHYDRAMINE HCL 25 MG PO CAPS: 25.0000 mg | ORAL_CAPSULE | Freq: Four times a day (QID) | ORAL | Status: DC | PRN

## 2019-01-10 MED ORDER — SODIUM CHLORIDE 0.9% FLUSH: 3.0000 mL | INTRAVENOUS | Status: DC | PRN

## 2019-01-10 MED ORDER — WITCH HAZEL-GLYCERIN EX PADS: 1.0000 "application " | MEDICATED_PAD | CUTANEOUS | Status: DC | PRN

## 2019-01-10 MED ORDER — DIPHENHYDRAMINE HCL 25 MG PO CAPS: 25.0000 mg | ORAL_CAPSULE | ORAL | Status: DC | PRN

## 2019-01-10 MED ORDER — ZOLPIDEM TARTRATE 5 MG PO TABS: 5.0000 mg | ORAL_TABLET | Freq: Every evening | ORAL | Status: DC | PRN

## 2019-01-10 MED ORDER — OXYCODONE HCL 5 MG PO TABS
5.0000 mg | ORAL_TABLET | ORAL | Status: DC | PRN
  Administered 2019-01-11 (×3): 5 mg via ORAL
  Administered 2019-01-12: 22:00:00 10 mg via ORAL
  Administered 2019-01-12: 5 mg via ORAL
  Administered 2019-01-12 – 2019-01-13 (×3): 10 mg via ORAL
  Filled 2019-01-10: qty 1
  Filled 2019-01-10: qty 2
  Filled 2019-01-10 (×2): qty 1
  Filled 2019-01-10: qty 2
  Filled 2019-01-10: qty 1
  Filled 2019-01-10: qty 2
  Filled 2019-01-10 (×2): qty 1

## 2019-01-10 MED ORDER — NALBUPHINE HCL 10 MG/ML IJ SOLN: 5.0000 mg | INTRAMUSCULAR | Status: DC | PRN

## 2019-01-10 MED ORDER — LACTATED RINGERS IV SOLN
INTRAVENOUS | Status: DC
  Administered 2019-01-11: via INTRAVENOUS

## 2019-01-10 MED ORDER — PRENATAL MULTIVITAMIN CH
1.0000 | ORAL_TABLET | Freq: Every day | ORAL | Status: DC
  Administered 2019-01-11 – 2019-01-12 (×2): 1 via ORAL
  Filled 2019-01-10 (×3): qty 1

## 2019-01-10 MED ORDER — DIPHENHYDRAMINE HCL 50 MG/ML IJ SOLN: 12.5000 mg | INTRAMUSCULAR | Status: DC | PRN

## 2019-01-10 MED ORDER — SODIUM CHLORIDE 0.9 % IV SOLN
INTRAVENOUS | Status: DC | PRN
  Administered 2019-01-10: 80 ug/min via INTRAVENOUS

## 2019-01-10 MED ORDER — SODIUM CHLORIDE 0.9 % IV SOLN
INTRAVENOUS | Status: DC | PRN
  Administered 2019-01-10: 40 [IU] via INTRAVENOUS

## 2019-01-10 MED ORDER — SOD CITRATE-CITRIC ACID 500-334 MG/5ML PO SOLN: 30.0000 mL | Freq: Once | ORAL | Status: DC

## 2019-01-10 MED ORDER — CALCIUM CARBONATE ANTACID 500 MG PO CHEW
500.0000 mg | CHEWABLE_TABLET | Freq: Every day | ORAL | Status: DC | PRN
  Administered 2019-01-12: 03:00:00 500 mg via ORAL
  Filled 2019-01-10: qty 3

## 2019-01-10 MED ORDER — KETOROLAC TROMETHAMINE 30 MG/ML IJ SOLN
30.0000 mg | Freq: Once | INTRAMUSCULAR | Status: AC | PRN
  Administered 2019-01-10: 30 mg via INTRAVENOUS

## 2019-01-10 MED ORDER — ONDANSETRON HCL 4 MG/2ML IJ SOLN
INTRAMUSCULAR | Status: AC
  Filled 2019-01-10: qty 2

## 2019-01-10 MED ORDER — MEPERIDINE HCL 25 MG/ML IJ SOLN: 6.2500 mg | INTRAMUSCULAR | Status: DC | PRN

## 2019-01-10 MED ORDER — SIMETHICONE 80 MG PO CHEW: 80.0000 mg | CHEWABLE_TABLET | ORAL | Status: DC | PRN

## 2019-01-10 MED ORDER — DEXTROSE 5 % IV SOLN
INTRAVENOUS | Status: AC
  Filled 2019-01-10: qty 3000

## 2019-01-10 MED ORDER — ONDANSETRON HCL 4 MG/2ML IJ SOLN: 4.0000 mg | Freq: Three times a day (TID) | INTRAMUSCULAR | Status: DC | PRN

## 2019-01-10 MED ORDER — MORPHINE SULFATE (PF) 0.5 MG/ML IJ SOLN
INTRAMUSCULAR | Status: DC | PRN
  Administered 2019-01-10: .15 mg via INTRATHECAL

## 2019-01-10 MED ORDER — LACTATED RINGERS IV SOLN
INTRAVENOUS | Status: DC
  Administered 2019-01-10 (×2): via INTRAVENOUS

## 2019-01-10 MED ORDER — SCOPOLAMINE 1 MG/3DAYS TD PT72
1.0000 | MEDICATED_PATCH | Freq: Once | TRANSDERMAL | Status: DC
  Administered 2019-01-12: 1.5 mg via TRANSDERMAL
  Filled 2019-01-10: qty 1

## 2019-01-10 MED ORDER — SODIUM CHLORIDE 0.9 % IV SOLN
INTRAVENOUS | Status: DC | PRN
  Administered 2019-01-10: 17:00:00 via INTRAVENOUS

## 2019-01-10 MED ORDER — NALOXONE HCL 4 MG/10ML IJ SOLN
1.0000 ug/kg/h | INTRAVENOUS | Status: DC | PRN
  Filled 2019-01-10: qty 5

## 2019-01-10 MED ORDER — NALBUPHINE HCL 10 MG/ML IJ SOLN: 5.0000 mg | Freq: Once | INTRAMUSCULAR | Status: DC | PRN

## 2019-01-10 MED ORDER — DEXTROSE 5 % IV SOLN
3.0000 g | INTRAVENOUS | Status: AC
  Administered 2019-01-10: 3 g via INTRAVENOUS
  Filled 2019-01-10: qty 3000

## 2019-01-10 MED ORDER — FENTANYL CITRATE (PF) 100 MCG/2ML IJ SOLN
INTRAMUSCULAR | Status: DC | PRN
  Administered 2019-01-10: 15 ug via INTRATHECAL

## 2019-01-10 MED ORDER — SIMETHICONE 80 MG PO CHEW
80.0000 mg | CHEWABLE_TABLET | ORAL | Status: DC
  Administered 2019-01-11 – 2019-01-12 (×3): 80 mg via ORAL
  Filled 2019-01-10 (×3): qty 1

## 2019-01-10 MED ORDER — SENNOSIDES-DOCUSATE SODIUM 8.6-50 MG PO TABS
2.0000 | ORAL_TABLET | ORAL | Status: DC
  Administered 2019-01-11 – 2019-01-12 (×3): 2 via ORAL
  Filled 2019-01-10 (×3): qty 2

## 2019-01-10 MED ORDER — DIBUCAINE (PERIANAL) 1 % EX OINT: 1.0000 "application " | TOPICAL_OINTMENT | CUTANEOUS | Status: DC | PRN

## 2019-01-10 MED ORDER — COCONUT OIL OIL: 1.0000 "application " | TOPICAL_OIL | Status: DC | PRN

## 2019-01-10 MED ORDER — FENTANYL CITRATE (PF) 100 MCG/2ML IJ SOLN
INTRAMUSCULAR | Status: AC
  Filled 2019-01-10: qty 2

## 2019-01-10 MED ORDER — SIMETHICONE 80 MG PO CHEW
80.0000 mg | CHEWABLE_TABLET | Freq: Three times a day (TID) | ORAL | Status: DC
  Administered 2019-01-11 – 2019-01-13 (×6): 80 mg via ORAL
  Filled 2019-01-10 (×6): qty 1

## 2019-01-10 MED ORDER — NALOXONE HCL 0.4 MG/ML IJ SOLN: 0.4000 mg | INTRAMUSCULAR | Status: DC | PRN

## 2019-01-10 MED ORDER — OXYTOCIN 40 UNITS IN NORMAL SALINE INFUSION - SIMPLE MED
INTRAVENOUS | Status: AC
  Filled 2019-01-10: qty 1000

## 2019-01-10 MED ORDER — MENTHOL 3 MG MT LOZG: 1.0000 | LOZENGE | OROMUCOSAL | Status: DC | PRN

## 2019-01-10 SURGICAL SUPPLY — 33 items
BENZOIN TINCTURE PRP APPL 2/3 (GAUZE/BANDAGES/DRESSINGS) ×2 IMPLANT
CHLORAPREP W/TINT 26ML (MISCELLANEOUS) ×2 IMPLANT
CLAMP CORD UMBIL (MISCELLANEOUS) IMPLANT
CLOTH BEACON ORANGE TIMEOUT ST (SAFETY) ×2 IMPLANT
DRSG OPSITE POSTOP 4X10 (GAUZE/BANDAGES/DRESSINGS) ×2 IMPLANT
ELECT REM PT RETURN 9FT ADLT (ELECTROSURGICAL) ×2
ELECTRODE REM PT RTRN 9FT ADLT (ELECTROSURGICAL) ×1 IMPLANT
EXTRACTOR VACUUM M CUP 4 TUBE (SUCTIONS) IMPLANT
GLOVE BIO SURGEON STRL SZ 6.5 (GLOVE) ×2 IMPLANT
GLOVE BIOGEL PI IND STRL 7.0 (GLOVE) ×1 IMPLANT
GLOVE BIOGEL PI INDICATOR 7.0 (GLOVE) ×1
GOWN STRL REUS W/TWL LRG LVL3 (GOWN DISPOSABLE) ×4 IMPLANT
KIT ABG SYR 3ML LUER SLIP (SYRINGE) IMPLANT
NEEDLE HYPO 25X5/8 SAFETYGLIDE (NEEDLE) IMPLANT
NS IRRIG 1000ML POUR BTL (IV SOLUTION) ×2 IMPLANT
PACK C SECTION WH (CUSTOM PROCEDURE TRAY) ×2 IMPLANT
PAD OB MATERNITY 4.3X12.25 (PERSONAL CARE ITEMS) ×2 IMPLANT
PENCIL SMOKE EVAC W/HOLSTER (ELECTROSURGICAL) ×2 IMPLANT
RTRCTR C-SECT PINK 25CM LRG (MISCELLANEOUS) ×2 IMPLANT
STRIP CLOSURE SKIN 1/2X4 (GAUZE/BANDAGES/DRESSINGS) ×2 IMPLANT
SUT MNCRL 0 VIOLET CTX 36 (SUTURE) ×2 IMPLANT
SUT MONOCRYL 0 CTX 36 (SUTURE) ×2
SUT PLAIN 1 NONE 54 (SUTURE) IMPLANT
SUT PLAIN 2 0 XLH (SUTURE) ×2 IMPLANT
SUT VIC AB 0 CT1 27 (SUTURE) ×3
SUT VIC AB 0 CT1 27XBRD ANBCTR (SUTURE) ×3 IMPLANT
SUT VIC AB 2-0 CT1 27 (SUTURE) ×1
SUT VIC AB 2-0 CT1 TAPERPNT 27 (SUTURE) ×1 IMPLANT
SUT VIC AB 4-0 KS 27 (SUTURE) ×2 IMPLANT
SYR BULB IRRIGATION 50ML (SYRINGE) ×2 IMPLANT
TOWEL OR 17X24 6PK STRL BLUE (TOWEL DISPOSABLE) ×2 IMPLANT
TRAY FOLEY W/BAG SLVR 14FR LF (SET/KITS/TRAYS/PACK) ×2 IMPLANT
WATER STERILE IRR 1000ML POUR (IV SOLUTION) ×2 IMPLANT

## 2019-01-10 NOTE — Transfer of Care (Signed)
Immediate Anesthesia Transfer of Care Note  Patient: Patricia Berry  Procedure(s) Performed: CESAREAN SECTION (N/A Abdomen)  Patient Location: PACU  Anesthesia Type:Spinal  Level of Consciousness: awake, alert  and oriented  Airway & Oxygen Therapy: Patient Spontanous Breathing  Post-op Assessment: Report given to RN and Post -op Vital signs reviewed and stable  Post vital signs: Reviewed and stable  Last Vitals:  Vitals Value Taken Time  BP 109/82 01/10/19 1740  Temp    Pulse 92 01/10/19 1741  Resp 17 01/10/19 1741  SpO2 100 % 01/10/19 1741  Vitals shown include unvalidated device data.  Last Pain:  Vitals:   01/10/19 1416  TempSrc: Oral         Complications: No apparent anesthesia complications

## 2019-01-10 NOTE — Anesthesia Preprocedure Evaluation (Signed)
Anesthesia Evaluation  Patient identified by MRN, date of birth, ID band Patient awake    Reviewed: Allergy & Precautions, Patient's Chart, lab work & pertinent test results  Airway Mallampati: III   Neck ROM: Full    Dental no notable dental hx.    Pulmonary    Pulmonary exam normal        Cardiovascular hypertension, Normal cardiovascular exam     Neuro/Psych negative neurological ROS  negative psych ROS   GI/Hepatic negative GI ROS, (+) Hepatitis -  Endo/Other  Morbid obesity  Renal/GU negative Renal ROS     Musculoskeletal negative musculoskeletal ROS (+)   Abdominal (+) + obese,   Peds  Hematology negative hematology ROS (+)   Anesthesia Other Findings   Reproductive/Obstetrics (+) Pregnancy                             Anesthesia Physical  Anesthesia Plan  ASA: IV  Anesthesia Plan: Spinal   Post-op Pain Management:    Induction:   PONV Risk Score and Plan: 4 or greater and Ondansetron, Dexamethasone, Treatment may vary due to age or medical condition, Scopolamine patch - Pre-op and Midazolam  Airway Management Planned: Natural Airway  Additional Equipment:   Intra-op Plan:   Post-operative Plan:   Informed Consent: I have reviewed the patients History and Physical, chart, labs and discussed the procedure including the risks, benefits and alternatives for the proposed anesthesia with the patient or authorized representative who has indicated his/her understanding and acceptance.       Plan Discussed with:   Anesthesia Plan Comments: ( )        Anesthesia Quick Evaluation

## 2019-01-10 NOTE — H&P (Signed)
Patricia Berry is a 33 y.o. female G2P1000 at 56+ with ROM - h/o cholestatis PP preeclamsia, Non Alcoholic Liver disease, AFLP.  Also maternal obesity.  Growth followed by [place].  Baseline protein in urine = 500+ mg.   D/w pt r/b/a of LTCS.  OB History    Gravida  2   Para  1   Term  1   Preterm  0   AB  0   Living  0     SAB  0   TAB  0   Ectopic  0   Multiple  0   Live Births  1         G1 VAVD 5#1 - female - PIH, chole, appy G2 present  No STD No abn pap  Past Medical History:  Diagnosis Date  . Anxiety   . Cholestasis   . Cholestasis during pregnancy   . Depression   . Heart disease   . Hepatic steatosis   . History of cold sores   . History of pre-eclampsia   . Hypertension   . NASH (nonalcoholic steatohepatitis)   . Obesity   . Status post vacuum-assisted vaginal delivery 02-Nov-2017   fetal death  . Vitamin D deficiency    Past Surgical History:  Procedure Laterality Date  . APPENDECTOMY    . LAPAROSCOPIC APPENDECTOMY N/A 08/04/2017   Procedure: APPENDECTOMY LAPAROSCOPIC;  Surgeon: Alphonsa Overall, MD;  Location: [facility];  Service: General;  Laterality: N/A;  . WISDOM TOOTH EXTRACTION     Family History: family history includes Cancer in her maternal aunt; Early death in her maternal grandmother; Heart disease in her maternal grandmother; Hyperlipidemia in her mother; Hypertension in her father and mother; Mental illness in her father and paternal grandmother; Pancreatic cancer in her maternal grandfather; Prostate cancer in her maternal grandfather. Social History:  reports that she has never smoked. She has never used smokeless tobacco. She reports previous alcohol use of about 2.0 standard drinks of alcohol per week. She reports that she does not use drugs.married, teacher  Meds PNV, baby ASA All NKDA     Maternal Diabetes: No Genetic Screening: Normal Maternal Ultrasounds/Referrals: Normal Fetal Ultrasounds or other Referrals:  None, Referred  to Materal Fetal Medicine  Maternal Substance Abuse:  No Significant Maternal Medications:  None Significant Maternal Lab Results:  Group B Strep negative Other Comments:  None  Review of Systems  Constitutional: Negative.   HENT: Negative.   Eyes: Negative.   Respiratory: Negative.   Cardiovascular: Negative.   Gastrointestinal: Negative.   Genitourinary: Negative.   Musculoskeletal: Negative.   Skin: Negative.   Neurological: Negative.   Psychiatric/Behavioral: Negative.    Maternal Medical History:  Reason for admission: Rupture of membranes.   Contractions: Frequency: irregular.   Perceived severity is moderate.    Prenatal complications: Maternal obesity, h/o cholestasis, AFLP. NASH  Prenatal Complications - Diabetes: none.      Blood pressure 131/75, pulse 90, temperature 98.4 F (36.9 C), temperature source Oral, resp. rate 16, height 5' 4"  (1.626 m), weight (!) 157.4 kg, last menstrual period 04/21/2018, SpO2 99 %, unknown if currently breastfeeding. Maternal Exam:  Uterine Assessment: Contraction frequency is irregular.   Abdomen: Fundal height is appropriate for gestation.   Estimated fetal weight is 7-8#.   Fetal presentation: vertex  Introitus: Normal vulva. Normal vagina.  Nitrazine test: positive.     Physical Exam  Constitutional: She is oriented to person, place, and time. She appears well-developed and well-nourished.  OBESE  HENT:  Head: Normocephalic and atraumatic.  Cardiovascular: Normal rate and regular rhythm.  Respiratory: Effort normal and breath sounds normal. No respiratory distress. She has no wheezes.  GI: Soft. Bowel sounds are normal. There is no abdominal tenderness.  Genitourinary:    Vulva normal.   Musculoskeletal: Normal range of motion.  Neurological: She is alert and oriented to person, place, and time.  Skin: Skin is warm and dry.  Psychiatric: She has a normal mood and affect. Her behavior is normal.    Prenatal  labs: ABO, Rh: B/Positive/-- (01/17 0000) Antibody: Negative (01/17 0000) Rubella: Immune (01/17 0000) RPR: Nonreactive (01/17 0000)  HBsAg: Negative (01/17 0000)  HIV: Non-reactive (01/17 0000)  GBS:   negative  Hgb 13.2/Plt 308K/Ur Cx neg/ Chl neg/ GC neg/ Varicella immune/Hgb electroWNL/TSH WNL Baseline 24hr urine 5100m, panorama low-risk female  [place]rowth followed [place] W[place]  Vtx, nl anat, post plac, female - [name]  Assessment/Plan: 32yo G2P10000 at 37+ with ROM for LTCS GBBS neg - no prophylaxis D/w pt r/b/a of LTCS Ancef for prophylaxis   Patricia Berry 01/10/2019, 3:11 PM

## 2019-01-10 NOTE — Brief Op Note (Signed)
01/10/2019  10:23 PM  PATIENT:  Patricia Berry  33 y.o. female  PRE-OPERATIVE DIAGNOSIS:  poor OB History outcome SROM H/O of fetal demise   POST-OPERATIVE DIAGNOSIS:  poor OB History outcome SROM H/O of fetal demise   PROCEDURE:  Procedure(s) with comments: CESAREAN SECTION (N/A) - Patricia Berry, Patricia Berry if she can  SURGEON:  Surgeon(s) and Role:    * Berry, Patricia Meuser, MD - Primary  ASSISTANTS: Patricia Berry RNFA   ANESTHESIA:   spinal  EBL:  398 mL IVF and uop per anesthesia  FINDINGS: viable female infant, [name] at 16:49, apgars 8/9, wt 8#11.9oz, nl uterus, tubes and ovaries  BLOOD ADMINISTERED:none  DRAINS: Urinary Catheter (Foley)   LOCAL MEDICATIONS USED:  NONE  SPECIMEN:  Source of Specimen:  Placenta to L&D  DISPOSITION OF SPECIMEN:  L&D  COUNTS:  YES  TOURNIQUET:  * No tourniquets in log *  DICTATION: .Other Dictation: Dictation Number [id number]  PLAN OF CARE: Admit to inpatient   PATIENT DISPOSITION:  PACU - hemodynamically stable.   Delay start of Pharmacological VTE agent (>24hrs) due to surgical blood loss or risk of bleeding: not applicable

## 2019-01-11 ENCOUNTER — Encounter (HOSPITAL_COMMUNITY): Payer: Self-pay | Admitting: Obstetrics and Gynecology

## 2019-01-11 LAB — CBC
HCT: 31.7 % — ABNORMAL LOW (ref 36.0–46.0)
Hemoglobin: 10.2 g/dL — ABNORMAL LOW (ref 12.0–15.0)
MCH: 28.1 pg (ref 26.0–34.0)
MCHC: 32.2 g/dL (ref 30.0–36.0)
MCV: 87.3 fL (ref 80.0–100.0)
Platelets: 228 10*3/uL (ref 150–400)
RBC: 3.63 MIL/uL — ABNORMAL LOW (ref 3.87–5.11)
RDW: 14.8 % (ref 11.5–15.5)
WBC: 10.7 10*3/uL — ABNORMAL HIGH (ref 4.0–10.5)
nRBC: 0 % (ref 0.0–0.2)

## 2019-01-11 LAB — RPR: RPR Ser Ql: NONREACTIVE

## 2019-01-11 LAB — ABO/RH: ABO/RH(D): B POS

## 2019-01-11 NOTE — Progress Notes (Signed)
Subjective: Postpartum Day 1 Cesarean Delivery Patient reports tolerating PO and no problems voiding.  Ambulating and pain well-controlled  Objective: Vital signs in last 24 hours: Temp:  [97.8 F (36.6 C)-99 F (37.2 C)] 98.5 F (36.9 C) (07/24 0745) Pulse Rate:  [68-96] 68 (07/24 0745) Resp:  [9-31] 17 (07/24 0745) BP: (98-131)/(57-82) 103/61 (07/24 0745) SpO2:  [94 %-100 %] 94 % (07/24 0745) Weight:  [157.4 kg] 157.4 kg (07/23 1416)  Physical Exam:  General: alert and cooperative Lochia: appropriate Uterine Fundus: firm Incision: C/D/I   Recent Labs    01/10/19 1442 01/11/19 0543  HGB 12.4 10.2*  HCT 36.6 31.7*    Assessment/Plan: Status post Cesarean section. Doing well postoperatively.  Continue current care. Ambulate. D/w pt circumcision and they desire to proceed.  Baby still working on maintaining temperature, so will do tomorrow Logan Bores 01/11/2019, 8:51 AM

## 2019-01-11 NOTE — Progress Notes (Signed)
MOB was referred for history of depression/anxiety. * Referral screened out by Clinical Social Worker because none of the following criteria appear to apply: ~ History of anxiety/depression during this pregnancy, or of post-partum depression following prior delivery. Per OB records review, no diagnoses or concerns noted. ~ Depression/Anxiety Diagnosis was situational and attributed to loss of last infant in 2019. MOB was seen by a CSW at that time and provided with resources. There are no current symptoms of anxiety/depression documented in prenatal records.  OR * MOB's symptoms currently being treated with medication and/or therapy.  Please contact the Clinical Social Worker if needs arise, by [facility] request, or if MOB scores greater than 9/yes to question 10 on [place] Postpartum Depression Screen.  Abundio Miu, [place] Worker [facility] Cell#: [phone]

## 2019-01-11 NOTE — Lactation Note (Signed)
This note was copied from a baby's chart. Lactation Consultation Note  Patient Name: Patricia Berry [id number] Date: 01/11/2019 Reason for consult: Early term 37-38.6wks;Other (Comment)(low blood sugar) P1/37 weeks 5 days.  Baby is 60 hours old and noted to be jittery so blood glucose done.  Glucose 35.  Mom has flat nipples that invert.  Breast tissue is compressible.  Colostrum is easily expressed with hand expression.  Baby is currently latched very well to right breast.  Baby is feeding actively with many swallows noted.  Observed feeding for 15 minutes.  Breast massage encouraged during feeding.  Baby pulled off and spit a small amount of colostrum.  Attempted latching baby to opposite breast but baby too sleepy.  Placed baby on mom's chest skin to skin.  Instructed to feed with cues and call for assist prn.  Mom  Has breast shells and manual pump.  Breastfeeding consultation services and support information given and reviewed.  Maternal Data Has patient been taught Hand Expression?: Yes Does the patient have breastfeeding experience prior to this delivery?: No  Feeding Feeding Type: Breast Fed  LATCH Score Latch: Grasps breast easily, tongue down, lips flanged, rhythmical sucking.  Audible Swallowing: Spontaneous and intermittent  Type of Nipple: Flat  Comfort (Breast/Nipple): Soft / non-tender  Hold (Positioning): Assistance needed to correctly position infant at breast and maintain latch.  LATCH Score: 8  Interventions Interventions: Hand pump;Shells  Lactation Tools Discussed/Used Tools: Shells;Pump Shell Type: Inverted Breast pump type: Manual   Consult Status Consult Status: Follow-up Date: 01/12/19 Follow-up type: In-patient    Ave Filter 01/11/2019, 11:50 AM

## 2019-01-11 NOTE — Anesthesia Postprocedure Evaluation (Signed)
Anesthesia Post Note  Patient: Arboriculturist  Procedure(s) Performed: CESAREAN SECTION (N/A Abdomen)     Patient location during evaluation: PACU Anesthesia Type: Spinal Level of consciousness: awake and alert Pain management: pain level controlled Vital Signs Assessment: post-procedure vital signs reviewed and stable Respiratory status: spontaneous breathing Cardiovascular status: stable Postop Assessment: spinal receding Anesthetic complications: no    Last Vitals:  Vitals:   01/11/19 0424 01/11/19 0745  BP: 123/80 103/61  Pulse: 79 68  Resp: 18 17  Temp: 36.6 C 36.9 C  SpO2: 95% 94%    Last Pain:  Vitals:   01/11/19 0916  TempSrc:   PainSc: [place]

## 2019-01-11 NOTE — Op Note (Signed)
NAMEYoulanda, [name] RECORD [id number] ACCOUNT [id number] DATE OF BIRTH:[birth year] FACILITY: [facility] LOCATION: [facility] PHYSICIAN:Keshun Berrett BOVARD-STUCKERT, MD  OPERATIVE REPORT  DATE OF PROCEDURE:  01/10/2019  PREOPERATIVE DIAGNOSES:  Intrauterine pregnancy at term with rupture of membranes, history of fetal demise, poor OB history, desires primary low transverse cesarean section.  POSTOPERATIVE DIAGNOSES:   Intrauterine pregnancy at term with rupture of membranes, history of fetal demise, poor OB history, desires primary low transverse cesarean section, delivered.  PROCEDURE:  Primary low transverse cesarean section with double-layered closure.  SURGEON:  Janyth Contes, MD  ASSISTANT:  Lester [place], RNFA  ANESTHESIA:  Spinal.  ESTIMATED BLOOD LOSS:  398 mL.  IV FLUIDS AND URINE OUTPUT:  Per anesthesia.  Clear urine at the end of the procedure per anesthesia.  FINDINGS:  Viable female infant at 65 with Apgars of 8 at one minute and 9 at five minutes and a weight of 8 pounds 11.9 ounces.  Normal uterus, tubes, and ovaries are noted.  COMPLICATIONS:  Maternal morbid obesity.  PATHOLOGY:  Placenta to labor and delivery.  DESCRIPTION OF PROCEDURE:  After informed consent was reviewed with the patient including risks, benefits and alternatives of the surgical procedure, she was transported to the operating room where spinal anesthesia was placed and found to be adequate.   She was then prepped and draped in the normal sterile fashion.  A Foley catheter was sterilely placed.  She was in the supine position with a leftward tilt.  After an appropriate timeout, she was prepped and draped in the normal sterile fashion.  A  Pfannenstiel skin incision was made at the level of approximately 2 fingerbreadths above the pubic symphysis and carried through the underlying layer of fascia sharply.  The fascia was incised in the midline.  The midline incision was extended laterally  with  Mayo scissors.  Superior aspect of the fascial incision was grasped with Kocher clamps, elevated, and the rectus muscles were dissected off both bluntly and sharply.  The midline was easily identified, and the peritoneum was entered bluntly.  The  Alexis skin retractor was placed carefully, making sure that no bowel was entrapped.  The uterus was explored.  A new bladder flap was created both digitally and sharply.  A transverse uterine incision was made.  The infant was delivered from a vertex  presentation with a transverse presentation.  Copious fluid was noted.  The patient had been noted to be polyhydramnios prior.  Infant was delivered.  Nose and mouth were suctioned on the field.  After a minute, the cord was clamped and cut.  The infant  was handed off to the waiting pediatric staff.  The placenta was expressed.  The uterus was cleared of all clot and debris.  The uterine incision was closed with 2 layers of 0 Monocryl, the first of which is running locked and the second as an  imbricating layer, noted to be hemostatic.  The gutters were cleared of all clot and debris.  The Alexis retractor was removed.  The peritoneum was reapproximated with 2-0 Vicryl in a running fashion.  Subfascial planes were inspected and found to be  hemostatic.  The fascia was reapproximated with 3-0 Vicryl from either end, overlapping in the midline.  The subcuticular adipose layer was made hemostatic with Bovie cautery.  The dead space was closed with plain gut.  The skin was reapproximated with  4-0 Vicryl on a Keith needle.  Benzoin was applied and then the Prevena dressing was placed, making  sure that a vacuum seal was obtained.  Sponge, lap, and needle counts were correct x2 per the operating staff.  The patient was transferred to the PACU in  stable condition.  LN/NUANCE  D:01/10/2019 T:01/11/2019 JOB:[id number]

## 2019-01-12 NOTE — Discharge Summary (Signed)
OB Discharge Summary     Patient Name: Patricia Berry DOB: [birth year] MRN: [id number]  Date of admission: 01/10/2019 Delivering MD: Janyth Contes   Date of discharge: 01/13/2019  Admitting diagnosis: pregnancy Intrauterine pregnancy: [redacted]w[redacted]d    Secondary diagnosis:  Principal Problem:   S/P cesarean section Active Problems:   Normal pregnancy, third trimester  Additional problems: none     Discharge diagnosis: Term Pregnancy Delivered                                                                                                Post partum procedures:none  Complications: None  Hospital course:  Sceduled C/S   33y.o. yo G2P2001 at 346w5das admitted to the hospital 01/10/2019 for scheduled cesarean section with the following indication:Prior neonatal demise with difficult delivery.  Membrane Rupture Time/Date: 9:30 AM ,01/10/2019   Patient delivered a Viable infant.01/10/2019  Details of operation can be found in separate operative note.  Pateint had an uncomplicated postpartum course.  She is ambulating, tolerating a regular diet, passing flatus, and urinating well. Patient is discharged home in stable condition on  01/13/19         Physical exam  Vitals:   01/12/19 0605 01/12/19 1457 01/12/19 2210 01/13/19 0644  BP: 122/81 120/70 130/81 132/87  Pulse: 87 80 90 85  Resp: 16 15 20 16   Temp: 98.2 F (36.8 C) 98.3 F (36.8 C) 98.2 F (36.8 C) 98 F (36.7 C)  TempSrc: Oral Oral Oral Oral  SpO2: 97%     Weight:      Height:       General: alert and cooperative Lochia: appropriate Uterine Fundus: firm Incision: Dressing is clean, dry, and intact DVT Evaluation: No evidence of DVT seen on physical exam. Labs: Lab Results  Component Value Date   WBC 10.7 (H) 01/11/2019   HGB 10.2 (L) 01/11/2019   HCT 31.7 (L) 01/11/2019   MCV 87.3 01/11/2019   PLT 228 01/11/2019   CMP Latest Ref Rng & Units 03/13/2018  Glucose 70 - 99 mg/dL -  BUN 6 - 23 mg/dL -   Creatinine 0.40 - 1.20 mg/dL -  Sodium 135 - 145 mEq/L -  Potassium 3.5 - 5.1 mEq/L -  Chloride 96 - 112 mEq/L -  CO2 19 - 32 mEq/L -  Calcium 8.4 - 10.5 mg/dL -  Total Protein 6.0 - 8.3 g/dL 6.9  Total Bilirubin 0.2 - 1.2 mg/dL 0.3  Alkaline Phos 39 - 117 U/L 55  AST 0 - 37 U/L 30  ALT 0 - 35 U/L 51(H)    Discharge instruction: per After Visit Summary and "Baby and Me Booklet".  After visit meds:  Allergies as of 01/13/2019   No Known Allergies     Medication List    STOP taking these medications   aspirin EC 81 MG tablet     TAKE these medications   famotidine-calcium carbonate-magnesium hydroxide 10-800-165 MG chewable tablet Commonly known as: PEPCID COMPLETE Chew 1 tablet by mouth daily as needed.   ibuprofen 800 MG tablet Commonly known as: ADVIL Take  1 tablet (800 mg total) by mouth every 8 (eight) hours.   ONE-A-DAY WOMENS PRENATAL 1 PO Take 2 tablets by mouth daily.   oxyCODONE 5 MG immediate release tablet Commonly known as: Oxy IR/ROXICODONE Take 1-2 tablets (5-10 mg total) by mouth every 4 (four) hours as needed for moderate pain.       Diet: home with mother  Activity: Advance as tolerated. Pelvic rest for 6 weeks.   Outpatient follow up:1 week Follow up Appt:No future appointments. Follow up Visit:No follow-ups on file.  Postpartum contraception: Undecided  Newborn Data: Live born female  Birth Weight: 8 lb 11.9 oz (3965 g) APGAR: 8, 9  Newborn Delivery   Birth date/time: 01/10/2019 16:49:00 Delivery type: C-Section, Low Transverse Trial of labor: No C-section categorization: Primary      Baby Feeding: Breast Disposition:home with mother   01/13/2019 Logan Bores, MD

## 2019-01-12 NOTE — Progress Notes (Signed)
Subjective: Postpartum Day 2: Cesarean Delivery Patient reports incisional pain, tolerating PO and no problems voiding.  States has not ambulated much, just to bathroom and some increased bleeding with ambulation  Objective: Vital signs in last 24 hours: Temp:  [97.8 F (36.6 C)-98.3 F (36.8 C)] 98.2 F (36.8 C) (07/25 0605) Pulse Rate:  [80-87] 87 (07/25 0605) Resp:  [16-18] 16 (07/25 0605) BP: (110-126)/(60-82) 122/81 (07/25 0605) SpO2:  [95 %-97 %] 97 % (07/25 8979)  Physical Exam:  General: alert and cooperative Lochia: appropriate Uterine Fundus: firm Incision: C/D/I with wound vac dressing   Recent Labs    01/10/19 1442 01/11/19 0543  HGB 12.4 10.2*  HCT 36.6 31.7*    Assessment/Plan: Status post Cesarean section. D/w pt want her to increase mobility and importance to avoid DVT. Probable discharge tomorrow  Logan Bores 01/12/2019, 10:52 AM

## 2019-01-12 NOTE — Lactation Note (Signed)
This note was copied from a baby's chart. Lactation Consultation Note: LC follow up visit with this 37+ 5 week infant. Infant is at 6% weight loss.  Infant has been spitting his formula. He is now on Alementum and mother reports that spitting a little better. She reports that he took 10 ml . She gives him 5 ml and burps him then she gives another 21m.  Mother reports that the formula rolls out of the sides of his mouth while feeding. She reports that he spit very little amt with this last feeding that was an hour ago.  Infant is swallowed lying in mothers arms.  Mother reports that she is pumping every 2-3 hours.   Mother reports that she has not attempt to breastfeed infant today. Encouraged mother to do frequent STS and attempt to breastfeed before each feeding.  Mother reports that her nipples are inverted.  Assist mother with hand expression and observed large drops of colostrum.  Mother was taught to firm nipple before latching infant.  Recommend that mother put on her bra so she can wear her shells.   Discussed with staff nurse that mother wants to try to breastfeed with the before the next feeding. She reports that she will assist .  Mother was given an Nfant nipple to try and see if infant take bottle better without spitting. Staff nurse or LValderswill assist with feeding.  reviewed paced bottle feeding with parents.  Parents receptive to plan and all teaching.   Patient Name: Boy HTatijana BierlyTDIXBO'EDate: 01/12/2019 Reason for consult: Follow-up assessment   Maternal Data Has patient been taught Hand Expression?: (reviewed again, observed drops from both breast)  Feeding Feeding Type: Bottle Fed - Formula Nipple Type: Extra Slow Flow  LATCH Score Latch: Grasps breast easily, tongue down, lips flanged, rhythmical sucking.  Audible Swallowing: A few with stimulation  Type of Nipple: Flat  Comfort (Breast/Nipple): Soft / non-tender  Hold (Positioning): Assistance needed to  correctly position infant at breast and maintain latch.  LATCH Score: 7  Interventions Interventions: Skin to skin;Shells;Hand pump;DEBP  Lactation Tools Discussed/Used Tools: Nipple Shields   Consult Status Consult Status: Follow-up Date: 01/11/19 Follow-up type: In-patient    KJess BartersMThe Orthopedic Specialty Hospital7/25/2020, 1:07 PM

## 2019-01-13 MED ORDER — IBUPROFEN 800 MG PO TABS: 800.0000 mg | ORAL_TABLET | Freq: Three times a day (TID) | ORAL | 0 refills | Status: DC

## 2019-01-13 MED ORDER — OXYCODONE HCL 5 MG PO TABS: 5.0000 mg | ORAL_TABLET | ORAL | 0 refills | Status: DC | PRN

## 2019-01-13 NOTE — Lactation Note (Signed)
This note was copied from a baby's chart. Lactation Consultation Note  Patient Name: Patricia Berry ORJGY'L Date: 01/13/2019 Reason for consult: Follow-up assessment;Difficult latch;Early term 41-38.6wks Baby is 65 hours/9% weight loss.  Mom states their plan has been putting baby to breast using nipple shield for 10 minutes followed by formula supplementation, skin to skin and pumping x 15 minutes.  Mom not obtaining milk yet.  Reassured.  She has a pump at home. Mom currently has baby in football hold.  24 mm nipple shield on and baby is latching but coming off and on.  A few drops of colostrum seen in shield.  Instructed to supplement baby amount he desires.  Recommended an outpatient appointment for feeding assessment and plan.  Maternal Data    Feeding Feeding Type: Breast Fed Nipple Type: Nfant Slow Flow (purple)  LATCH Score Latch: Repeated attempts needed to sustain latch, nipple held in mouth throughout feeding, stimulation needed to elicit sucking reflex.  Audible Swallowing: None  Type of Nipple: Flat  Comfort (Breast/Nipple): Soft / non-tender  Hold (Positioning): No assistance needed to correctly position infant at breast.  LATCH Score: 6  Interventions    Lactation Tools Discussed/Used Tools: Nipple Shields Nipple shield size: 24   Consult Status Consult Status: Complete Follow-up type: Call as needed    Ave Filter 01/13/2019, 10:31 AM

## 2019-01-13 NOTE — Progress Notes (Signed)
Subjective: Postpartum Day 3: Cesarean Delivery Patient reports tolerating PO and no problems voiding.  Ambulating better.  Pumping and supplementing  Objective: Vital signs in last 24 hours: Temp:  [98 F (36.7 C)-98.3 F (36.8 C)] 98 F (36.7 C) (07/26 0644) Pulse Rate:  [80-90] 85 (07/26 0644) Resp:  [15-20] 16 (07/26 0644) BP: (120-132)/(70-87) 132/87 (07/26 9371)  Physical Exam:  General: alert and cooperative Lochia: appropriate Uterine Fundus: firm Incision: clean/dry/intact   Recent Labs    01/10/19 1442 01/11/19 0543  HGB 12.4 10.2*  HCT 36.6 31.7*    Assessment/Plan: Status post Cesarean section. Doing well postoperatively.  Discharge home with standard precautions and return to clinic in 7-10 days to room wound vac dressing.  Logan Bores 01/13/2019, 9:27 AM

## 2019-01-14 ENCOUNTER — Encounter: Payer: Self-pay | Admitting: Family Medicine

## 2019-01-17 ENCOUNTER — Other Ambulatory Visit (HOSPITAL_COMMUNITY)
Admission: RE | Admit: 2019-01-17 | Discharge: 2019-01-17 | Disposition: A | Discharge status: Home / Self Care | Payer: BC Managed Care – PPO | Source: Ambulatory Visit

## 2019-01-17 HISTORY — DX: Nonalcoholic steatohepatitis (NASH): K75.81

## 2019-01-17 HISTORY — DX: Personal history of other complications of pregnancy, childbirth and the puerperium: Z87.59

## 2019-01-22 ENCOUNTER — Ambulatory Visit: Payer: Self-pay

## 2019-01-22 NOTE — Lactation Note (Signed)
This note was copied from a baby's chart. Lactation Consultation Note  Patient Name: Patricia Berry OVZCH'Y Date: 01/22/2019    01/22/2019  Name: Patricia Berry MRN: [id number] Date of Birth: 01/10/2018 Gestational Age: Gestational Age: 3w5dBirth Weight: 139.9 oz Weight today:     8 pounds 4.1 ounces (3744 grams) with clean newborn diaper   141day old ET infant presents today with mom and dad for feeding assessment. Infant is not BF well at this time. Infant 39 weeks adjusted age.   Infant has gained 124 grams in the last 9 days with an average daily weight gain of 14 grams a day. Infant last at ped on 7/30 and was 8 pounds 1 ounces or 8 pounds 2 ounces.   Infant is self awakening every 4-5 hours during the day and every 2 hours at night. Infant is being BF during the day and only suckles a few times. Infant wakes to feed every 2 hours at night. Enc parents to awaken infant more during the day to see if that helps.   Mom is pumping with Medela PIS 4 x a day. She is not pumping at all at night. Reviewed supply and demand and importance of stimulating and emptying the breasts more regularly. Mom using hands free bra, reviewed and encouraged hands on pumping. Mom was told by OB to try Fenugreek, mom given handout and reviewed dosage for milk production.   Infant is being supplemented with EBM and formula with several bottles. Infant using the Avent, Playtex, and Dr. BSaul Fordycebottles. Infant does drool on the bottle. Reviewed paced bottle feeding and trying a slower flow nipple.   Infant fed at the breast and was noted to be sleepy. Used the 5 french feeding tube at the breast and infant more active. Infant sleepy at the breast.   Infant with thin labial frenulum that inserts near the bottom of the gum ridge. Upper lip tight with flanging and needs flanging on the breast. Infant with thin short anterior/posterior lingual frenulum. Infant with good tongue extension and lateralization.  Infant with decreased mid tongue elevation. Infant with strong suckle on gloved finger. He does drool some on the bottle. Mom with some pain with feeding, most of the time better with deeper latch. Mom's nipple rounded post feeding. Infant sleepy at the breast and much more active with 5 french feeding tube. Discussed how tongue and lip restrictions can effect milk supply and transfer. Parents given website and local provider information. Parents to research and will decide if they want infant to be evaluated by oral specialist.   Infant to follow up with Lactation in 2 weeks. Infant to follow up with Ped 8/6. Mom to call with questions/concerns as needed.    General Information: Mother's reason for visit: Feeding assessment Consult: Initial Lactation consultant: SIvin BootyHice RN,IBCLC Breastfeeding experience: BF 3-4 x a day for a few minutes   Maternal medications: Pre-natal vitamin, Motrin (ibuprofen)  Breastfeeding History: Frequency of breast feeding: 3-4 x a day Duration of feeding: few sucks  Supplementation: Supplement method: bottle(Playtex, Dr. BSaul Fordyceand ATomasa Rand Brand: Alimentum Formula volume: 2.5-3 ounces Formula frequency: every 2-5 hours   Breast milk volume: 2 ounces Breast milk frequency: 1 x a day   Pump type: Medela pump in style Pump frequency: 4 x a day Pump volume: 1.5-2 ounces  Infant Output Assessment: Voids per 24 hours: 11 Urine color: Clear yellow Stools per 24 hours: 8 Stool color: Yellow  Breast Assessment: Breast: Soft,  Compressible, Hyperplasia Nipple: Erect Pain level: 2 Pain interventions: Bra, Breast pump  Feeding Assessment: Infant oral assessment: Variance Infant oral assessment comment: see note Positioning: Football(right breast, 20 minutes) Latch: 1 - Repeated attempts needed to sustain latch, nipple held in mouth throughout feeding, stimulation needed to elicit sucking reflex. Audible swallowing: 1 - A few with stimulation Type of  nipple: 2 - Everted at rest and after stimulation(invaginated in the center) Comfort: 1 - Filling, red/small blisters or bruises, mild/mod discomfort Hold: 1 - Assistance needed to correctly position infant at breast and maintain latch LATCH score: 6 Latch assessment: Deep Lips flanged: No(upper lip needs flanging) Suck assessment: Displays both Tools: Syringe with 5 Fr feeding tube Pre-feed weight: 3744 grams Post feed weight: 3776 grams Amount transferred: 11 ml Amount supplemented: 22 ml EBM via 5 french feeding tube + 2 ounces formula in the bottle  Additional Feeding Assessment:                                    Totals: Total amount transferred: 11 ml Total supplement given: 75 ml (Ebm and formula) Total amount pumped post feed: did not pump   Donn Pierini RN, IBCLC                                                    Patricia Berry 01/22/2019, 9:19 AM

## 2019-02-05 ENCOUNTER — Ambulatory Visit: Payer: Self-pay

## 2019-02-05 NOTE — Lactation Note (Signed)
This note was copied from a baby's chart. Lactation Consultation Note  Patient Name: Patricia Berry WCBJS'E Date: 02/05/2019    02/05/2019  Name: Ikhlas Albo MRN: [id number] Date of Birth: 01/10/2018 Gestational Age: Gestational Age: 39w5dBirth Weight: 139.9 oz Weight today:    9 pounds 7.2 ounces (4288 grams) with clean newborn diaper  Infant presents today with mom and dad for follow up feeding assessment. Infant is latching better to the breast and is more alert overall.   Infant has gained 544 grams in the last 14 days with an average daily weight gain of 39 grams a day.   Infant is BF every 3 hours during the day, mom feels infant is elongating his time on the breast. He is latching to the breast during the day with each feeding and is pumping and bottle feeding at night. Infant sleepy at the breast. They tried the 5 french feeding tube and did not work for them at home. Infant is being supplemented by bottle feeding.   Mom is pumping about 4-5 x a day and is not pumping from 6 pm-9 am. Amounts vary depending on the time of day. Mom feels like she is staying hydrated. Reviewed supply and demand and enc mom to not go more than 6 hours at night between pumpings. Mom is frustrated with pumping as she sometimes gets very little. Enc pumping about 8 x a day to promote and protect milk supply.   Mom has been taking Fenugreek 3 capsules TID for over a week. She has not noticed a difference in her supply.OB has prescribed Reglan, mom is anxious about taking due to side effects. Gave info in [facility] and Mother's Milk. Reviewed taking after BF so peak is decreasing prior to next feeding. Mom is not sure she wants to take.   Infant is using the Playtex nipple, Avent nipple and Dr. BRoosvelt Harps They use the Avent the most. They are pace bottle feeding and note that infant does choke on the bottle some. Infant also choked on the breast in the office.   Infant with short  labial frenulum that inserts at the bottom of the gum ridge. Upper lip tight with flanging and needs flanging at the breast. Infant with Anterior/posterior lingual frenulum. Infant with decreased mid tongue elevation. Suckle is strong with good tongue cupping and extension. Infant pops on and off the breast at times. Mom is not experiencing pain with feedings. Infant is sleepy at the breast. Supply is inadequate for infant. Infant choked on both breasts at beginning of the feeding. Infant did not transfer well at the breast although it looked like he was feeding. Discussed that infant is not transferring well and may be caused by his tongue and lip restrictions in addition to low milk supply.   Infant latched to the breast in the football hold. He was active at the breast at times and seemed to be swallowing although he did not transfer well on the breast. Mom always offers infant the bottle after BF.   Infant to follow up with Dr. DFrederic Jerichoon Sept. 17. [facility]     General Information: Mother's reason for visit: Follow up feeding assessment Consult: Follow-up Lactation consultant: SNonah MattesRN,IBCLC Breastfeeding experience: Latching 4-5 x a day for up to 25 minutes   Maternal medications: Pre-natal vitamin, Motrin (ibuprofen)  Breastfeeding History: Frequency of breast feeding: every 3 hours during the day and occasionally  at night Duration of feeding: 15-25 minutes, usually nurses on both breasts  Supplementation: Supplement method: bottle(Avent, Playtex, and Dr. Saul Fordyce) Brand: Enfamil Formula volume: 4 ounces Formula frequency: 7 x a day   Breast milk volume: .25-2 ounces Breast milk frequency: 4-5 x a day   Pump type: Medela pump in style Pump frequency: 4-5 x a day, going 12 hours between pumping/feeding at night Pump volume: .25-2 ounces  Infant Output Assessment: Voids per 24 hours: 9 Urine color: Dark yellow Stools  per 24 hours: 2 Stool color: Yellow  Breast Assessment: Breast: Soft, Compressible Nipple: Erect(Nipple on the left tends to flatten with areolar compression, infant still latches well to it.) Pain level: 0 Pain interventions: Bra, Breast pump  Feeding Assessment: Infant oral assessment: Variance Infant oral assessment comment: see note Positioning: Football(10 minutes, sleepy) Latch: 1 - Repeated attempts needed to sustain latch, nipple held in mouth throughout feeding, stimulation needed to elicit sucking reflex. Audible swallowing: 1 - A few with stimulation Type of nipple: 2 - Everted at rest and after stimulation Comfort: 2 - Soft/non-tender Hold: 2 - No assistance needed to correctly position infant at breast LATCH score: 8 Latch assessment: Deep Lips flanged: No(Upper lip needs flanging)     Pre-feed weight: 4288 grams Post feed weight: 4290 grams Amount transferred: 2 ml Amount supplemented: 0  Additional Feeding Assessment: Infant oral assessment: Variance Infant oral assessment comment: see note Positioning: Football(left breast, 15 minutes) Latch: 1 - Repeated attempts neede to sustain latch, nipple held in mouth throughout feeding, stimulation needed to elicit sucking reflex. Audible swallowing: 1 - A few with stimulation Type of nipple: 2 - Everted at rest and after stimulation Comfort: 2 - Soft/non-tender Hold: 2 - No assistance needed to correctly position infant at breast LATCH score: 8 Latch assessment: Deep Lips flanged: Yes Suck assessment: Displays both   Pre-feed weight: 4290 grams Post feed weight: 4290 grams Amount transferred: 0 Amount supplemented: 60 ml formula via bottle  Totals: Total amount transferred: 2 ml Total supplement given: 60 ml formula via bottle Total amount pumped post feed: did not pump   Plan:   1. Offer infant the breast about 3-4 times a day. Limit breast feeding to about 20 minutes if he is sleepy at the breast.  2.  Keep infant awake at the breast with feedings as needed, feed infant skin to skin 3. Massage/compress breast with feeding at the breast 4. Offer him a bottle of pumped breast milk or formula after breast feeding 5. Infant need about 79-105 ml (2.5-3.5 ounces) for 8 feedings a day or 630-840 ml (21-28 ounces) in 24 hours. Infant may take more or less depending on how often he feeds. Feed infant until he is satisfied.  6. Feed infant using the paced bottle feeding method (video on [email or web address]) 7. Would recommend that you pump 8-12 x a day for 15-20 minutes to promote and protect your milk supply. Hands on pumping may be helpful to help empty the breast 8. Power pumping may be helpful for milk production- Pump for 20 minutes, rest for 20 minutes, pump for 10  Minutes, rest for 10 minutes, pump for 10 minutes 9. Can stop the Fenugreek since not seeing an increase in milk supply.  10. Can try the Reglan if you feel like it is best for you 11. Keep up the good work 70. Thank you for allowing me to assist you today 13. Please call with questions/concerns as needed [phone] 14. Follow up  with Lactation as needed    Donn Pierini RN, IBCLC                                                       Debby Freiberg Jacob Cicero 02/05/2019, 9:28 AM

## 2019-02-15 ENCOUNTER — Ambulatory Visit (INDEPENDENT_AMBULATORY_CARE_PROVIDER_SITE_OTHER): Payer: BC Managed Care – PPO | Admitting: Family Medicine

## 2019-02-15 ENCOUNTER — Other Ambulatory Visit: Payer: Self-pay

## 2019-02-15 ENCOUNTER — Telehealth: Payer: Self-pay | Admitting: Family Medicine

## 2019-02-15 ENCOUNTER — Encounter: Payer: Self-pay | Admitting: Family Medicine

## 2019-02-15 VITALS — BP 122/79 | HR 82 | Temp 98.7°F | Ht 64.0 in | Wt 320.0 lb

## 2019-02-15 DIAGNOSIS — B001 Herpesviral vesicular dermatitis: Secondary | ICD-10-CM

## 2019-02-15 MED ORDER — VALACYCLOVIR HCL 1 G PO TABS: ORAL_TABLET | ORAL | 0 refills | Status: DC

## 2019-02-15 NOTE — Progress Notes (Signed)
VIRTUAL VISIT VIA VIDEO  I connected with Patricia Berry on 02/15/19 at  4:15 PM EDT by a video enabled telemedicine application and verified that I am speaking with the correct person using two identifiers. Location patient: Home Location provider: [facility], Office Persons participating in the virtual visit: Patient, Dr. Raoul Pitch and R.Baker, LPN  I discussed the limitations of evaluation and management by telemedicine and the availability of in person appointments. The patient expressed understanding and agreed to proceed.   SUBJECTIVE Chief Complaint  Patient presents with  . Mouth Lesions    thinks she has 4 lesions. one on the right side of bottom lip and three in the middle. she said her lip is very swollen. painful. x1day.     HPI: Patricia Berry is a 33 y.o. female presents for cold sores.  She states she has a cold sore outbreak on her mouth for the past few days.  She states become rather swollen and painful over the last day.  She has a known history of cold sore outbreaks.  Typically she takes Valtrex for this condition but has run out of her prescription.  She has been using topical over-the-counter cold sore medicine.  ROS: See pertinent positives and negatives per HPI.  Patient Active Problem List   Diagnosis Date Noted  . Normal pregnancy, third trimester 01/10/2019  . S/P cesarean section 01/10/2019  . History of fetal demise, not currently pregnant 12/12/2017  . Depression, major, single episode, in partial remission ([place]) 12/12/2017  . Elevated liver enzymes 12/12/2017  . History of cholestasis during pregnancy 12/12/2017  . Nausea 10/27/2016  . Benign paroxysmal positional vertigo 10/27/2016  . FH: heart disease 08/01/2016  . Vitamin D deficiency 10/13/2014  . BMI 50.0-59.9, adult ([place]) 04/25/2014  . History of peripheral edema 04/25/2014    Social History   Tobacco Use  . Smoking status: Never Smoker  . Smokeless tobacco: Never Used  Substance  Use Topics  . Alcohol use: Not Currently    Alcohol/week: 2.0 standard drinks    Types: 2 Glasses of wine per week    Current Outpatient Medications:  .  famotidine-calcium carbonate-magnesium hydroxide (PEPCID COMPLETE) 10-800-165 MG chewable tablet, Chew 1 tablet by mouth daily as needed., Disp: , Rfl:  .  ibuprofen (ADVIL) 800 MG tablet, Take 1 tablet (800 mg total) by mouth every 8 (eight) hours., Disp: 30 tablet, Rfl: 0 .  oxyCODONE (OXY IR/ROXICODONE) 5 MG immediate release tablet, Take 1-2 tablets (5-10 mg total) by mouth every 4 (four) hours as needed for moderate pain. (Patient not taking: Reported on 02/15/2019), Disp: 20 tablet, Rfl: 0 .  Prenat-Fe Carbonyl-FA-Omega 3 (ONE-A-DAY WOMENS PRENATAL 1 PO), Take 2 tablets by mouth daily., Disp: , Rfl:   No Known Allergies  OBJECTIVE: BP 122/79   Pulse 82   Temp 98.7 F (37.1 C) (Oral)   Ht 5' 4"  (1.626 m)   Wt (!) 320 lb (145.2 kg)   Breastfeeding No   BMI 54.93 kg/m  Gen: No acute distress. Nontoxic in appearance.  HENT: AT. [place].  MMM.  Swollen lower lip with ulcerations. Eyes:Pupils Equal Round Reactive to light, Extraocular movements intact,  Conjunctiva without redness, discharge or icterus. Skin: no rashes, purpura or petechiae.  Neuro:  Alert. Oriented x3  Psych: Normal affect, dress and demeanor. Normal speech. Normal thought content and judgment.  ASSESSMENT AND PLAN: Patricia Berry is a 33 y.o. female present for  Recurrent cold sores Valtrex prescribed 2 g x  2 doses 12 hours apart with flares.  She was provided with extra tabs to use if other flares occur. Follow-up as needed > 15 minutes spent with patient, > 50% of that time face to face    Howard Pouch, DO 02/15/2019

## 2019-02-15 NOTE — Telephone Encounter (Signed)
Patient called requesting RF for valcyclovir.  [facility], [address] is her preferred pharmacy.

## 2019-02-15 NOTE — Telephone Encounter (Signed)
Appt scheduled today at 4:15pm.  Patient not seen in over one year.

## 2019-02-18 ENCOUNTER — Encounter: Payer: Self-pay | Admitting: Family Medicine

## 2019-04-10 ENCOUNTER — Encounter (HOSPITAL_COMMUNITY): Payer: Self-pay | Admitting: Obstetrics and Gynecology

## 2019-07-10 ENCOUNTER — Encounter: Payer: Self-pay | Admitting: Family Medicine

## 2019-07-10 ENCOUNTER — Ambulatory Visit (INDEPENDENT_AMBULATORY_CARE_PROVIDER_SITE_OTHER): Payer: BC Managed Care – PPO | Admitting: Family Medicine

## 2019-07-10 VITALS — Temp 96.8°F | Ht 64.0 in

## 2019-07-10 DIAGNOSIS — R05 Cough: Secondary | ICD-10-CM

## 2019-07-10 DIAGNOSIS — J029 Acute pharyngitis, unspecified: Secondary | ICD-10-CM | POA: Diagnosis not present

## 2019-07-10 DIAGNOSIS — J40 Bronchitis, not specified as acute or chronic: Secondary | ICD-10-CM | POA: Diagnosis not present

## 2019-07-10 DIAGNOSIS — Z7189 Other specified counseling: Secondary | ICD-10-CM

## 2019-07-10 DIAGNOSIS — R059 Cough, unspecified: Secondary | ICD-10-CM

## 2019-07-10 DIAGNOSIS — R0981 Nasal congestion: Secondary | ICD-10-CM

## 2019-07-10 MED ORDER — DOXYCYCLINE HYCLATE 100 MG PO TABS: 100.0000 mg | ORAL_TABLET | Freq: Two times a day (BID) | ORAL | 0 refills | Status: DC

## 2019-07-10 NOTE — Patient Instructions (Signed)
COVID-19 COVID-19 is a respiratory infection that is caused by a virus called severe acute respiratory syndrome coronavirus 2 (SARS-CoV-2). The disease is also known as coronavirus disease or novel coronavirus. In some people, the virus may not cause any symptoms. In others, it may cause a serious infection. The infection can get worse quickly and can lead to complications, such as:  Pneumonia, or infection of the lungs.  Acute respiratory distress syndrome or ARDS. This is a condition in which fluid build-up in the lungs prevents the lungs from filling with air and passing oxygen into the blood.  Acute respiratory failure. This is a condition in which there is not enough oxygen passing from the lungs to the body or when carbon dioxide is not passing from the lungs out of the body.  Sepsis or septic shock. This is a serious bodily reaction to an infection.  Blood clotting problems.  Secondary infections due to bacteria or fungus.  Organ failure. This is when your body's organs stop working. The virus that causes COVID-19 is contagious. This means that it can spread from person to person through droplets from coughs and sneezes (respiratory secretions). What are the causes? This illness is caused by a virus. You may catch the virus by:  Breathing in droplets from an infected person. Droplets can be spread by a person breathing, speaking, singing, coughing, or sneezing.  Touching something, like a table or a doorknob, that was exposed to the virus (contaminated) and then touching your mouth, nose, or eyes. What increases the risk? Risk for infection You are more likely to be infected with this virus if you:  Are within 6 feet (2 meters) of a person with COVID-19.  Provide care for or live with a person who is infected with COVID-19.  Spend time in crowded indoor spaces or live in shared housing. Risk for serious illness You are more likely to become seriously ill from the virus if you:   Are 50 years of age or older. The higher your age, the more you are at risk for serious illness.  Live in a nursing home or long-term care facility.  Have cancer.  Have a long-term (chronic) disease such as: ? Chronic lung disease, including chronic obstructive pulmonary disease or asthma. ? A long-term disease that lowers your body's ability to fight infection (immunocompromised). ? Heart disease, including heart failure, a condition in which the arteries that lead to the heart become narrow or blocked (coronary artery disease), a disease which makes the heart muscle thick, weak, or stiff (cardiomyopathy). ? Diabetes. ? Chronic kidney disease. ? Sickle cell disease, a condition in which red blood cells have an abnormal "sickle" shape. ? Liver disease.  Are obese. What are the signs or symptoms? Symptoms of this condition can range from mild to severe. Symptoms may appear any time from 2 to 14 days after being exposed to the virus. They include:  A fever or chills.  A cough.  Difficulty breathing.  Headaches, body aches, or muscle aches.  Runny or stuffy (congested) nose.  A sore throat.  New loss of taste or smell. Some people may also have stomach problems, such as nausea, vomiting, or diarrhea. Other people may not have any symptoms of COVID-19. How is this diagnosed? This condition may be diagnosed based on:  Your signs and symptoms, especially if: ? You live in an area with a COVID-19 outbreak. ? You recently traveled to or from an area where the virus is common. ? You   provide care for or live with a person who was diagnosed with COVID-19. ? You were exposed to a person who was diagnosed with COVID-19.  A physical exam.  Lab tests, which may include: ? Taking a sample of fluid from the back of your nose and throat (nasopharyngeal fluid), your nose, or your throat using a swab. ? A sample of mucus from your lungs (sputum). ? Blood tests.  Imaging tests, which  may include, X-rays, CT scan, or ultrasound. How is this treated? At present, there is no medicine to treat COVID-19. Medicines that treat other diseases are being used on a trial basis to see if they are effective against COVID-19. Your health care provider will talk with you about ways to treat your symptoms. For most people, the infection is mild and can be managed at home with rest, fluids, and over-the-counter medicines. Treatment for a serious infection usually takes places in a hospital intensive care unit (ICU). It may include one or more of the following treatments. These treatments are given until your symptoms improve.  Receiving fluids and medicines through an IV.  Supplemental oxygen. Extra oxygen is given through a tube in the nose, a face mask, or a hood.  Positioning you to lie on your stomach (prone position). This makes it easier for oxygen to get into the lungs.  Continuous positive airway pressure (CPAP) or bi-level positive airway pressure (BPAP) machine. This treatment uses mild air pressure to keep the airways open. A tube that is connected to a motor delivers oxygen to the body.  Ventilator. This treatment moves air into and out of the lungs by using a tube that is placed in your windpipe.  Tracheostomy. This is a procedure to create a hole in the neck so that a breathing tube can be inserted.  Extracorporeal membrane oxygenation (ECMO). This procedure gives the lungs a chance to recover by taking over the functions of the heart and lungs. It supplies oxygen to the body and removes carbon dioxide. Follow these instructions at home: Lifestyle  If you are sick, stay home except to get medical care. Your health care provider will tell you how long to stay home. Call your health care provider before you go for medical care.  Rest at home as told by your health care provider.  Do not use any products that contain nicotine or tobacco, such as cigarettes, e-cigarettes, and  chewing tobacco. If you need help quitting, ask your health care provider.  Return to your normal activities as told by your health care provider. Ask your health care provider what activities are safe for you. General instructions  Take over-the-counter and prescription medicines only as told by your health care provider.  Drink enough fluid to keep your urine pale yellow.  Keep all follow-up visits as told by your health care provider. This is important. How is this prevented?  There is no vaccine to help prevent COVID-19 infection. However, there are steps you can take to protect yourself and others from this virus. To protect yourself:   Do not travel to areas where COVID-19 is a risk. The areas where COVID-19 is reported change often. To identify high-risk areas and travel restrictions, check the CDC travel website: wwwnc.cdc.gov/travel/notices  If you live in, or must travel to, an area where COVID-19 is a risk, take precautions to avoid infection. ? Stay away from people who are sick. ? Wash your hands often with soap and water for 20 seconds. If soap and water   are not available, use an alcohol-based hand sanitizer. ? Avoid touching your mouth, face, eyes, or nose. ? Avoid going out in public, follow guidance from your state and local health authorities. ? If you must go out in public, wear a cloth face covering or face mask. Make sure your mask covers your nose and mouth. ? Avoid crowded indoor spaces. Stay at least 6 feet (2 meters) away from others. ? Disinfect objects and surfaces that are frequently touched every day. This may include:  Counters and tables.  Doorknobs and light switches.  Sinks and faucets.  Electronics, such as phones, remote controls, keyboards, computers, and tablets. To protect others: If you have symptoms of COVID-19, take steps to prevent the virus from spreading to others.  If you think you have a COVID-19 infection, contact your health care  provider right away. Tell your health care team that you think you may have a COVID-19 infection.  Stay home. Leave your house only to seek medical care. Do not use public transport.  Do not travel while you are sick.  Wash your hands often with soap and water for 20 seconds. If soap and water are not available, use alcohol-based hand sanitizer.  Stay away from other members of your household. Let healthy household members care for children and pets, if possible. If you have to care for children or pets, wash your hands often and wear a mask. If possible, stay in your own room, separate from others. Use a different bathroom.  Make sure that all people in your household wash their hands well and often.  Cough or sneeze into a tissue or your sleeve or elbow. Do not cough or sneeze into your hand or into the air.  Wear a cloth face covering or face mask. Make sure your mask covers your nose and mouth. Where to find more information  Centers for Disease Control and Prevention: www.cdc.gov/coronavirus/2019-ncov/index.html  World Health Organization: www.who.int/health-topics/coronavirus Contact a health care provider if:  You live in or have traveled to an area where COVID-19 is a risk and you have symptoms of the infection.  You have had contact with someone who has COVID-19 and you have symptoms of the infection. Get help right away if:  You have trouble breathing.  You have pain or pressure in your chest.  You have confusion.  You have bluish lips and fingernails.  You have difficulty waking from sleep.  You have symptoms that get worse. These symptoms may represent a serious problem that is an emergency. Do not wait to see if the symptoms will go away. Get medical help right away. Call your local emergency services (911 in the [place] Do not drive yourself to the hospital. Let the emergency medical personnel know if you think you have COVID-19. Summary  COVID-19 is a  respiratory infection that is caused by a virus. It is also known as coronavirus disease or novel coronavirus. It can cause serious infections, such as pneumonia, acute respiratory distress syndrome, acute respiratory failure, or sepsis.  The virus that causes COVID-19 is contagious. This means that it can spread from person to person through droplets from breathing, speaking, singing, coughing, or sneezing.  You are more likely to develop a serious illness if you are 50 years of age or older, have a weak immune system, live in a nursing home, or have chronic disease.  There is no medicine to treat COVID-19. Your health care provider will talk with you about ways to treat your symptoms.    Take steps to protect yourself and others from infection. Wash your hands often and disinfect objects and surfaces that are frequently touched every day. Stay away from people who are sick and wear a mask if you are sick. This information is not intended to replace advice given to you by your health care provider. Make sure you discuss any questions you have with your health care provider. Document Revised: 04/05/2019 Document Reviewed: 07/12/2018 Elsevier Patient Education  2020 Elsevier Inc.  

## 2019-07-10 NOTE — Progress Notes (Signed)
VIRTUAL VISIT VIA VIDEO  I connected with Patricia Berry on 07/10/19 at  2:00 PM EST by a video enabled telemedicine application and verified that I am speaking with the correct person using two identifiers. Location patient: Home Location provider: [facility], Office Persons participating in the virtual visit: Patient, Dr. Raoul Pitch and R.Baker, LPN  I discussed the limitations of evaluation and management by telemedicine and the availability of in person appointments. The patient expressed understanding and agreed to proceed.   SUBJECTIVE Chief Complaint  Patient presents with  . Cough    Pt has been sick x6 days with headache, nasal congestion, and cough. Nobody else is sick in the home. She is working from home   . Nasal Congestion  . Headache    HPI: Patricia Berry is a 34 y.o. female present for acute illness.  Patient states on January 14 she noted symptoms of sore throat.  The day following she started to have a cough, headache, nasal congestion and hoarseness.  She denies any nausea, vomiting, fever, chills or diarrhea.  She states her cough has become worse over the last few days.  She was using cough drops but last night she added NyQuil/DayQuil.  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  No one in her home has showed any signs or symptoms.  ROS: See pertinent positives and negatives per HPI.  Patient Active Problem List   Diagnosis Date Noted  . Normal pregnancy, third trimester 01/10/2019  . S/P cesarean section 01/10/2019  . History of fetal demise, not currently pregnant 12/12/2017  . Depression, major, single episode, in partial remission ([place] 12/12/2017  . Elevated liver enzymes 12/12/2017  . History of cholestasis during pregnancy 12/12/2017  . Nausea 10/27/2016  . Benign paroxysmal positional vertigo 10/27/2016  . FH: heart disease 08/01/2016  . Vitamin D deficiency 10/13/2014  . BMI 50.0-59.9, adult  ([place] 04/25/2014  . History of peripheral edema 04/25/2014    Social History   Tobacco Use  . Smoking status: Never Smoker  . Smokeless tobacco: Never Used  Substance Use Topics  . Alcohol use: Not Currently    Alcohol/week: 2.0 standard drinks    Types: 2 Glasses of wine per week    Current Outpatient Medications:  .  Prenat-Fe Carbonyl-FA-Omega 3 (ONE-A-DAY WOMENS PRENATAL 1 PO), Take 2 tablets by mouth daily., Disp: , Rfl:  .  valACYclovir (VALTREX) 1000 MG tablet, 2 tabs PRN at onset of cold sore, then repeat 2 tabs in 12 hours once., Disp: 20 tablet, Rfl: 0 .  famotidine-calcium carbonate-magnesium hydroxide (PEPCID COMPLETE) 10-800-165 MG chewable tablet, Chew 1 tablet by mouth daily as needed., Disp: , Rfl:  .  ibuprofen (ADVIL) 800 MG tablet, Take 1 tablet (800 mg total) by mouth every 8 (eight) hours. (Patient not taking: Reported on 07/10/2019), Disp: 30 tablet, Rfl: 0  No Known Allergies  OBJECTIVE: Temp (!) 96.8 F (36 C) (Temporal)   Ht 5' 4"  (1.626 m)   LMP 06/17/2019 (Exact Date)   Breastfeeding No   BMI 54.93 kg/m  Gen: No acute distress. Nontoxic in appearance.  HENT: AT. [place].  MMM.  Eyes:Pupils Equal Round Reactive to light, Extraocular movements intact,  Conjunctiva without redness, discharge or icterus. Chest: Cough or shortness of breath not present.  Skin: no rashes, purpura or petechiae.  Neuro:  Normal gait. Alert. Oriented x3  Psych: Normal affect, dress and demeanor. Normal speech. Normal thought content and judgment.  ASSESSMENT AND PLAN: Patricia Berry is a 34 y.o. female present for  Cough/Nasal congestion/Sore throat/Bronchitis/Educated about COVID-19 virus infection Rest, hydrate.  Over-the-counter supportive therapy: +/- flonase, mucinex (DM if cough), nettie pot or nasal saline.  Can continue NyQuil/DayQuil Doxycycline twice daily prescribed, take until completed.  Discussed Covid testing and she was provided with [place] resources for  testing in order was placed today. Discussed isolation protocol. F/U 2 weeks of not improved.   - Novel Coronavirus, NAA ([facility])   Orders Placed This Encounter  Procedures  . Novel Coronavirus, NAA ([facility])   Meds ordered this encounter  Medications  . doxycycline (VIBRA-TABS) 100 MG tablet    Sig: Take 1 tablet (100 mg total) by mouth 2 (two) times daily.    Dispense:  20 tablet    Refill:  [place], DO 07/10/2019

## 2019-07-12 ENCOUNTER — Ambulatory Visit: Payer: BC Managed Care – PPO | Attending: Internal Medicine

## 2019-07-12 DIAGNOSIS — Z20822 Contact with and (suspected) exposure to covid-19: Secondary | ICD-10-CM

## 2019-07-13 LAB — NOVEL CORONAVIRUS, NAA: SARS-CoV-2, NAA: NOT DETECTED

## 2019-07-15 ENCOUNTER — Telehealth: Payer: Self-pay | Admitting: Family Medicine

## 2019-07-15 NOTE — Telephone Encounter (Signed)
Please inform patient the following information: covid test is negative.

## 2019-07-15 NOTE — Telephone Encounter (Signed)
Pt was called and given information, she verbalized understanding  

## 2019-07-31 ENCOUNTER — Encounter: Payer: Self-pay | Admitting: Family Medicine

## 2019-08-01 ENCOUNTER — Other Ambulatory Visit: Payer: Self-pay

## 2019-08-01 ENCOUNTER — Ambulatory Visit (INDEPENDENT_AMBULATORY_CARE_PROVIDER_SITE_OTHER): Payer: BC Managed Care – PPO | Admitting: Family Medicine

## 2019-08-01 ENCOUNTER — Encounter: Payer: Self-pay | Admitting: Family Medicine

## 2019-08-01 VITALS — Temp 98.7°F | Ht 64.0 in

## 2019-08-01 DIAGNOSIS — J029 Acute pharyngitis, unspecified: Secondary | ICD-10-CM | POA: Diagnosis not present

## 2019-08-01 DIAGNOSIS — R591 Generalized enlarged lymph nodes: Secondary | ICD-10-CM | POA: Diagnosis not present

## 2019-08-01 MED ORDER — PREDNISONE 20 MG PO TABS: ORAL_TABLET | ORAL | 0 refills | Status: DC

## 2019-08-01 MED ORDER — AMOXICILLIN-POT CLAVULANATE 875-125 MG PO TABS: 1.0000 | ORAL_TABLET | Freq: Two times a day (BID) | ORAL | 0 refills | Status: DC

## 2019-08-01 NOTE — Progress Notes (Signed)
VIRTUAL VISIT VIA VIDEO  I connected with Patricia Berry on 08/01/19 at 10:15 AM EST by a video enabled telemedicine application and verified that I am speaking with the correct person using two identifiers. Location patient: Home Location provider: [facility], Office Persons participating in the virtual visit: Patient, Dr. Raoul Pitch and R.Baker, LPN  I discussed the limitations of evaluation and management by telemedicine and the availability of in person appointments. The patient expressed understanding and agreed to proceed.   SUBJECTIVE Chief Complaint  Patient presents with  . Sore Throat    Chills and fatigue Started yesterday afternoon. This morning she woke up and throat is sore. Unable to drink due to pain. Denies fever.   . Nasal Congestion  . Headache    HPI: Patricia Berry is a 34 y.o. female present to discuss sudden onset of severe chills and fatigue yesterday afternoon.  She reports she felt so severely cold she was unable to get warm.  After work she went home and slept for a few hours which is unlike her.  This morning when she woke up her throat with extremely sore.  She endorses swollen lymph glands on the left side of her neck.  She has had mild nasal congestion and a mild headache.  She reports her throat was extremely uncomfortable with attempting to even swallow liquids.  She denies known fever but states she woke up in a sweat.  She reports pain in her ears when she attempted to swallow.  She still has a sense of smell and taste.  Her husband has been ill he was tested for Covid and he was negative.  Her baby has also been ill and tested negative for Covid.  Patient had an acute illness 3 weeks ago, tested negative for Covid at that time.  Treated for sinus infection and reports complete recovery until yesterday.  ROS: See pertinent positives and negatives per HPI.  Patient Active Problem List   Diagnosis Date Noted  . Normal pregnancy, third trimester  01/10/2019  . S/P cesarean section 01/10/2019  . History of fetal demise, not currently pregnant 12/12/2017  . Depression, major, single episode, in partial remission ([place]) 12/12/2017  . Elevated liver enzymes 12/12/2017  . History of cholestasis during pregnancy 12/12/2017  . Nausea 10/27/2016  . Benign paroxysmal positional vertigo 10/27/2016  . FH: heart disease 08/01/2016  . Vitamin D deficiency 10/13/2014  . BMI 50.0-59.9, adult ([place]) 04/25/2014  . History of peripheral edema 04/25/2014    Social History   Tobacco Use  . Smoking status: Never Smoker  . Smokeless tobacco: Never Used  Substance Use Topics  . Alcohol use: Not Currently    Alcohol/week: 2.0 standard drinks    Types: 2 Glasses of wine per week    Current Outpatient Medications:  .  famotidine-calcium carbonate-magnesium hydroxide (PEPCID COMPLETE) 10-800-165 MG chewable tablet, Chew 1 tablet by mouth daily as needed., Disp: , Rfl:  .  ibuprofen (ADVIL) 800 MG tablet, Take 1 tablet (800 mg total) by mouth every 8 (eight) hours., Disp: 30 tablet, Rfl: 0 .  Prenat-Fe Carbonyl-FA-Omega 3 (ONE-A-DAY WOMENS PRENATAL 1 PO), Take 2 tablets by mouth daily., Disp: , Rfl:  .  valACYclovir (VALTREX) 1000 MG tablet, 2 tabs PRN at onset of cold sore, then repeat 2 tabs in 12 hours once., Disp: 20 tablet, Rfl: 0 .  doxycycline (VIBRA-TABS) 100 MG tablet, Take 1 tablet (100 mg total) by mouth 2 (two) times daily. (Patient not taking: Reported  on 08/01/2019), Disp: 20 tablet, Rfl: 0  No Known Allergies  OBJECTIVE: Temp 98.7 F (37.1 C) (Temporal)   Ht 5' 4"  (1.626 m)   LMP 07/14/2019 (Exact Date)   Breastfeeding No   BMI 54.93 kg/m  Gen: No acute distress. Nontoxic in appearance.  HENT: AT. [place].  MMM.  Eyes:Pupils Equal Round Reactive to light, Extraocular movements intact,  Conjunctiva without redness, discharge or icterus. Chest: Cough or shortness of breath not present Skin: No rashes, purpura or petechiae.  Neuro:   Normal gait. Alert. Oriented x3  Psych: Normal affect, dress and demeanor. Normal speech. Normal thought content and judgment.  ASSESSMENT AND PLAN: Aylanie Cubillos is a 34 y.o. female present for  Lymphadenopathy/Pharyngitis, unspecified etiology Rest, hydrate.  Patient was encouraged to start a daily over-the-counter antihistamine such as Allegra or Zyrtec. Discussed viral versus bacterial infections.  It does sound her family is passing around an acute illness.  She did have a complete recovery after last episode a few weeks ago.  Discussed options with her today and she would like to start antibiotics. Augmentin prescribed.  Prednisone prescribed.  Encouraged to use hot teas and honey and salt water gargles. Not feel the need to have her tested again for Covid, she is not leaving the house and her other 2 family members are Covid negative F/U 2 weeks of not improved.   No orders of the defined types were placed in this encounter.  Meds ordered this encounter  Medications  . amoxicillin-clavulanate (AUGMENTIN) 875-125 MG tablet    Sig: Take 1 tablet by mouth 2 (two) times daily.    Dispense:  14 tablet    Refill:  0  . predniSONE (DELTASONE) 20 MG tablet    Sig: 60 mg x3d, 40 mg x3d, 20 mg x2d, 10 mg x2d    Dispense:  18 tablet    Refill:  0   Referral Orders  No referral(s) requested today       Howard Pouch, DO 08/01/2019

## 2019-09-10 ENCOUNTER — Telehealth: Payer: Self-pay

## 2019-09-10 ENCOUNTER — Ambulatory Visit (INDEPENDENT_AMBULATORY_CARE_PROVIDER_SITE_OTHER): Payer: BC Managed Care – PPO | Admitting: Internal Medicine

## 2019-09-10 ENCOUNTER — Other Ambulatory Visit: Payer: Self-pay

## 2019-09-10 ENCOUNTER — Encounter: Payer: Self-pay | Admitting: Internal Medicine

## 2019-09-10 VITALS — Ht 64.0 in

## 2019-09-10 DIAGNOSIS — T8069XA Other serum reaction due to other serum, initial encounter: Secondary | ICD-10-CM

## 2019-09-10 NOTE — Progress Notes (Signed)
Pre visit review using our clinic review tool, if applicable. No additional management support is needed unless otherwise documented below in the visit note. 

## 2019-09-10 NOTE — Telephone Encounter (Signed)
Patient Name: [facility] [place] Gender: Female DOB: 01/20/1986 Age: 34 Y 65 M 10 D Return Phone Number: [phone] (Primary) Address: City/State/Zip: [place]  [address] Client [facility] - Client Client Site [place] - Day Physician Raoul Pitch, [place] Contact Type Call Who Is Calling Patient / Member / Family / Caregiver Call Type Triage / Clinical Relationship To Patient Self Return Phone Number [phone] (Primary) Chief Complaint Immunization Reaction Reason for Call Symptomatic / Request for [place] states she believe she is having a reaction to the covid vaccine. She got the [employer] vaccine. Her arm has swollen.Her throat was scratchy. Her arm has swollen and red. Translation No Nurse Assessment Nurse: Leilani Merl, RN, Heather Date/Time (Eastern Time): 09/10/2019 12:13:12 PM Confirm and document reason for call. If symptomatic, describe symptoms. ---Caller states she believe she is having a reaction to the covid vaccine. She got the [place] vaccine on Friday evening. Her arm has swollen. Her throat was scratchy. Her arm has swollen and red. Has the patient had close contact with a person known or suspected to have the novel coronavirus illness OR traveled / lives in area with major community spread (including international travel) in the last 14 days from the onset of symptoms? * If Asymptomatic, screen for exposure and travel within the last 14 days. ---No Does the patient have any new or worsening symptoms? ---Yes Will a triage be completed? ---Yes Related visit to physician within the last 2 weeks? ---No Does the PT have any chronic conditions? (i.e. diabetes, asthma, this includes High risk factors for pregnancy, etc.) ---No Is the patient pregnant or possibly pregnant? (Ask all females between the ages of 50-55) ---No Is this a behavioral health or substance abuse call?  ---No Guidelines Guideline Title Affirmed Question Affirmed Notes Nurse Date/Time (Eastern Time) Coronavirus (COVID-19) - Vaccine Questions and Reactions [1] Pain, tenderness, or swelling at the injection site AND [2] over 3 days Standifer, RNNira Conn 09/10/2019 12:13:34 PM Page: 2 of 2 Call Id: [id number] Guidelines Guideline Title Affirmed Question Affirmed Notes Nurse Date/Time Eilene Ghazi Time) (72 hours) since vaccine AND [3] getting worse Disp. Time Eilene Ghazi Time) Disposition Final User 09/10/2019 12:18:03 PM See PCP within 24 Hours Yes Standifer, RN, Nira Conn Caller Disagree/Comply Comply Caller Understands Yes PreDisposition Call Doctor Care Advice Given Per Guideline SEE PCP WITHIN 24 HOURS: * IF OFFICE WILL BE OPEN: You need to be seen within the next 24 hours. Call your doctor (or NP/PA) when the office opens and make an appointment. CALL BACK IF: * You become worse. CARE ADVICE given per Coronavirus (COVID-19) - Vaccine Questions and Reactions (Adult) guideline. Referrals REFERRED TO PCP OFFICE

## 2019-09-10 NOTE — Telephone Encounter (Signed)
Patient called. Her arm that she had her COVID vaccine in on 09/06/19 is progressively getting more painful and swollen. Transferred patient to nurse triage.

## 2019-09-10 NOTE — Progress Notes (Addendum)
Subjective:    Patient ID: Patricia Berry, female    DOB: 12/09/1985, 34 y.o.   MRN: [id number]  DOS:  09/10/2019 Type of visit - description: Virtual Visit via Video Note  I connected with the above patient  by a video enabled telemedicine application and verified that I am speaking with the correct person using two identifiers.   THIS ENCOUNTER IS A VIRTUAL VISIT DUE TO COVID-19 - PATIENT WAS NOT SEEN IN THE OFFICE. PATIENT HAS CONSENTED TO VIRTUAL VISIT / TELEMEDICINE VISIT   Location of patient: home  Location of provider: office  I discussed the limitations of evaluation and management by telemedicine and the availability of in person appointments. The patient expressed understanding and agreed to proceed.  Acute visit The patient had her second [place] vaccination late 09/06/2019, the next day developed small red spot at the site of injection. She also had some sore throat. She felt that her throat was possibly swollen but denies shortness of breath, stridor, or changes in her voice. Since then, the redness around the arm has increased in size and is itchy. The throat feels about the same.   Review of Systems Denies fever chills No nausea or vomiting Denies any rash or itching anywhere else except for the arm. Denies tongue, lip swelling.   Past Medical History:  Diagnosis Date  . Anxiety   . Cholestasis   . Cholestasis during pregnancy   . Depression   . Heart disease   . Hepatic steatosis   . History of cold sores   . History of pre-eclampsia   . Hypertension   . NASH (nonalcoholic steatohepatitis)   . Obesity   . S/P cesarean section 01/10/2019  . Status post vacuum-assisted vaginal delivery 10-24-2017   fetal death  . Vitamin D deficiency     Past Surgical History:  Procedure Laterality Date  . APPENDECTOMY    . CESAREAN SECTION N/A 01/10/2019   Procedure: CESAREAN SECTION;  Surgeon: Janyth Contes, MD;  Location: [facility];  Service:  Obstetrics;  Laterality: N/A;  TRacyRNFA, Heather if she can  . LAPAROSCOPIC APPENDECTOMY N/A 08/04/2017   Procedure: APPENDECTOMY LAPAROSCOPIC;  Surgeon: Alphonsa Overall, MD;  Location: [facility];  Service: General;  Laterality: N/A;  . WISDOM TOOTH EXTRACTION      Social History   Socioeconomic History  . Marital status: Married    Spouse name: Octavia Bruckner   . Number of children: 0  . Years of education: 23  . Highest education level: Not on file  Occupational History  . Occupation: Pharmacist, hospital    Comment: [employer]  Tobacco Use  . Smoking status: Never Smoker  . Smokeless tobacco: Never Used  Substance and Sexual Activity  . Alcohol use: Not Currently    Alcohol/week: 2.0 standard drinks    Types: 2 Glasses of wine per week  . Drug use: No  . Sexual activity: Yes    Partners: Male    Birth control/protection: Condom    Comment: married  Other Topics Concern  . Not on file  Social History Narrative   Ms. Howatt lives with her husband Octavia Bruckner. She works FT as a Careers information officer, 8th grade. She enjoys traveling over seas.   She has a master's degree.   Drinks caffeine.    Daily vitamin use.    Wears her seatbelt. Smoke detector in the home.    Exercises 3x week.    Feels safe in her relationships    Social  Determinants of Health   Financial Resource Strain: Low Risk   . Difficulty of Paying Living Expenses: Not hard at all  Food Insecurity: No Food Insecurity  . Worried About Charity fundraiser in the Last Year: Never true  . Ran Out of Food in the Last Year: Never true  Transportation Needs: Unknown  . Lack of Transportation (Medical): No  . Lack of Transportation (Non-Medical): Not on file  Physical Activity:   . Days of Exercise per Week:   . Minutes of Exercise per Session:   Stress: Stress Concern Present  . Feeling of Stress : Rather much  Social Connections:   . Frequency of Communication with Friends and Family:   . Frequency of Social Gatherings with  Friends and Family:   . Attends Religious Services:   . Active Member of Clubs or Organizations:   . Attends Archivist Meetings:   Marland Kitchen Marital Status:   Intimate Partner Violence: Not At Risk  . Fear of Current or Ex-Partner: No  . Emotionally Abused: No  . Physically Abused: No  . Sexually Abused: No      Allergies as of 09/10/2019   No Known Allergies     Medication List       Accurate as of September 10, 2019  4:18 PM. If you have any questions, ask your nurse or doctor.        amoxicillin-clavulanate 875-125 MG tablet Commonly known as: AUGMENTIN Take 1 tablet by mouth 2 (two) times daily.   famotidine-calcium carbonate-magnesium hydroxide 10-800-165 MG chewable tablet Commonly known as: PEPCID COMPLETE Chew 1 tablet by mouth daily as needed.   ibuprofen 800 MG tablet Commonly known as: ADVIL Take 1 tablet (800 mg total) by mouth every 8 (eight) hours.   ONE-A-DAY WOMENS PRENATAL 1 PO Take 2 tablets by mouth daily.   predniSONE 20 MG tablet Commonly known as: DELTASONE 60 mg x3d, 40 mg x3d, 20 mg x2d, 10 mg x2d   valACYclovir 1000 MG tablet Commonly known as: VALTREX 2 tabs PRN at onset of cold sore, then repeat 2 tabs in 12 hours once.          Objective:   Physical Exam Musculoskeletal:       Arms:    Ht 5' 4"  (1.626 m)   LMP 08/17/2019 (Exact Date)   Breastfeeding No   BMI 54.93 kg/m  This is a virtual video visit, she is alert oriented x3, face is symmetric, no respiratory distress.  Her voice is a little deep but she said is normal for her. No stridor. Skin: I was able to see her left shoulder, some redness noted, see graphic    Assessment    34 year old female, PMH includes obesity, NASH,  , depression, presents with:  Local allergic reaction: The patient developed local allergic reaction to the second Covid vaccination [employer]) within 24 hours of the administration. No systemic symptoms, no other rashes, she does have some sore  throat and possibly swelling but but denies voice changes, stridor. Has not taken any medication for her symptoms. Plan: Tylenol, Benadryl twice a day until better, over-the-counter hydrocortisone to 3 times a day at the shoulder, ice pack once or twice a day to the shoulder. If not gradually better or if she gets worse she is to let us know. If she has any truly throat swelling: Needs to seek medical attention acutely.      I discussed the assessment and treatment plan with the patient. The  patient was provided an opportunity to ask questions and all were answered. The patient agreed with the plan and demonstrated an understanding of the instructions.   The patient was advised to call back or seek an in-person evaluation if the symptoms worsen or if the condition fails to improve as anticipated.

## 2019-09-10 NOTE — Telephone Encounter (Signed)
Pt was called and  She has VV set up with a [facility] Provider due to Dr Raoul Pitch being on vacation

## 2019-09-10 NOTE — Telephone Encounter (Signed)
Pt was scheduled for visit

## 2019-12-17 ENCOUNTER — Telehealth: Payer: Self-pay | Admitting: Family Medicine

## 2019-12-17 ENCOUNTER — Encounter: Payer: Self-pay | Admitting: Family Medicine

## 2019-12-17 ENCOUNTER — Ambulatory Visit (INDEPENDENT_AMBULATORY_CARE_PROVIDER_SITE_OTHER): Payer: BC Managed Care – PPO | Admitting: Family Medicine

## 2019-12-17 ENCOUNTER — Other Ambulatory Visit: Payer: Self-pay

## 2019-12-17 VITALS — BP 123/87 | HR 72 | Temp 98.7°F | Resp 18 | Ht 64.1 in | Wt 350.2 lb

## 2019-12-17 DIAGNOSIS — Z6841 Body Mass Index (BMI) 40.0 and over, adult: Secondary | ICD-10-CM

## 2019-12-17 DIAGNOSIS — Z130 Encounter for screening for diseases of the blood and blood-forming organs and certain disorders involving the immune mechanism: Secondary | ICD-10-CM | POA: Diagnosis not present

## 2019-12-17 DIAGNOSIS — Z131 Encounter for screening for diabetes mellitus: Secondary | ICD-10-CM | POA: Diagnosis not present

## 2019-12-17 DIAGNOSIS — R748 Abnormal levels of other serum enzymes: Secondary | ICD-10-CM | POA: Diagnosis not present

## 2019-12-17 DIAGNOSIS — Z0000 Encounter for general adult medical examination without abnormal findings: Secondary | ICD-10-CM | POA: Diagnosis not present

## 2019-12-17 DIAGNOSIS — E559 Vitamin D deficiency, unspecified: Secondary | ICD-10-CM | POA: Diagnosis not present

## 2019-12-17 DIAGNOSIS — R635 Abnormal weight gain: Secondary | ICD-10-CM | POA: Diagnosis not present

## 2019-12-17 LAB — CBC
HCT: 37.1 % (ref 36.0–46.0)
Hemoglobin: 12.1 g/dL (ref 12.0–15.0)
MCHC: 32.7 g/dL (ref 30.0–36.0)
MCV: 78.7 fl (ref 78.0–100.0)
Platelets: 293 10*3/uL (ref 150.0–400.0)
RBC: 4.71 Mil/uL (ref 3.87–5.11)
RDW: 15 % (ref 11.5–15.5)
WBC: 6.1 10*3/uL (ref 4.0–10.5)

## 2019-12-17 LAB — LIPID PANEL
Cholesterol: 157 mg/dL (ref 0–200)
HDL: 48.9 mg/dL (ref >39.00)
LDL Cholesterol: 75 mg/dL (ref 0–99)
NonHDL: 108.07
Total CHOL/HDL Ratio: 3
Triglycerides: 164 mg/dL — ABNORMAL HIGH (ref 0.0–149.0)
VLDL: 32.8 mg/dL (ref 0.0–40.0)

## 2019-12-17 LAB — COMPREHENSIVE METABOLIC PANEL
ALT: 39 U/L — ABNORMAL HIGH (ref 0–35)
AST: 27 U/L (ref 0–37)
Albumin: 3.9 g/dL (ref 3.5–5.2)
Alkaline Phosphatase: 61 U/L (ref 39–117)
BUN: 9 mg/dL (ref 6–23)
CO2: 29 mEq/L (ref 19–32)
Calcium: 8.7 mg/dL (ref 8.4–10.5)
Chloride: 103 mEq/L (ref 96–112)
Creatinine, Ser: 0.74 mg/dL (ref 0.40–1.20)
GFR: 90.55 mL/min (ref >60.00)
Glucose, Bld: 100 mg/dL — ABNORMAL HIGH (ref 70–99)
Potassium: 4 mEq/L (ref 3.5–5.1)
Sodium: 136 mEq/L (ref 135–145)
Total Bilirubin: 0.4 mg/dL (ref 0.2–1.2)
Total Protein: 6.1 g/dL (ref 6.0–8.3)

## 2019-12-17 LAB — TSH: TSH: 3.15 u[IU]/mL (ref 0.35–4.50)

## 2019-12-17 LAB — HEMOGLOBIN A1C: Hgb A1c MFr Bld: 5.2 % (ref 4.6–6.5)

## 2019-12-17 LAB — VITAMIN D 25 HYDROXY (VIT D DEFICIENCY, FRACTURES): VITD: 18.51 ng/mL — ABNORMAL LOW (ref 30.00–100.00)

## 2019-12-17 NOTE — Telephone Encounter (Signed)
Please inform patient Her thyroid and kidney function are normal Her blood cell counts and electrolytes are normal Her cholesterol panel looks really good with a total cholesterol of 157, HDL 48, LDL 75 and triglycerides of 164 nonfasting. Diabetes screening looks great with an A1c of 5.2-normal range Liver function looks good with just a very mildly elevated ALT of 39 (< 35 normal).  This is an improvement from her prior collections. Vitamin D levels are significantly low at 18.5> per her medication list she is taking 400 units of vitamin D daily.  I would encourage her to start 1000 units of vitamin D OTC daily. Follow-up in 8-12 weeks with provider on vitamin D deficiency.

## 2019-12-17 NOTE — Progress Notes (Signed)
This visit occurred during the SARS-CoV-2 public health emergency.  Safety protocols were in place, including screening questions prior to the visit, additional usage of staff PPE, and extensive cleaning of exam room while observing appropriate contact time as indicated for disinfecting solutions.    Patient ID: Patricia Berry, female  DOB: 03/23/1986, 34 y.o.   MRN: [id number] Patient Care Team    Relationship Specialty Notifications Start End  Ma Hillock, DO PCP - General Family Medicine  08/01/16   Janyth Contes, MD Consulting Physician Obstetrics and Gynecology  12/17/19   Skeet Latch, MD Attending Physician Cardiology  12/17/19   Jerene Bears, MD Consulting Physician Gastroenterology  12/17/19     Chief Complaint  Patient presents with  . Annual Exam    Not fasting. Pap smear 06/2015.     Subjective:  Patricia Berry is a 34 y.o.  Female  present for CPE. All past medical history, surgical history, allergies, family history, immunizations, medications and social history were updated in the electronic medical record today. All recent labs, ED visits and hospitalizations within the last year were reviewed.  Health maintenance:  Colonoscopy: no fhx. Routine screen at 11 Mammogram: no fhx, routine screen at 40 Cervical cancer screening: last pap: 06/2018, GYN Immunizations: tdap 10/2018, Influenza UTD (encouraged yearly), COVID vaccines completed.  Infectious disease screening: HIV and Hep C completed DEXA: routine  Assistive device: none Oxygen WGN:FAOZ Patient has a Dental home. Hospitalizations/ED visits: reviewed   Depression screen [facility] 2/9 12/17/2019 12/12/2017 08/01/2016  Decreased Interest 0 2 0  Down, Depressed, Hopeless 0 3 0  PHQ - 2 Score 0 5 0  Altered sleeping - 3 -  Tired, decreased energy - 3 -  Change in appetite - 2 -  Feeling bad or failure about yourself  - 3 -  Trouble concentrating - 2 -  Moving slowly or fidgety/restless - 0 -    Suicidal thoughts - 0 -  PHQ-9 Score - 18 -  Difficult doing work/chores - Not difficult at all -   No flowsheet data found.  Immunization History  Administered Date(s) Administered  . Influenza,inj,Quad PF,6+ Mos 03/20/2017  . Influenza-Unspecified 03/20/2017, 07/06/2018  . PFIZER SARS-COV-2 Vaccination 08/16/2019, 09/06/2019  . PPD Test 06/17/2014  . Tdap 08/03/2017, 11/05/2018    Past Medical History:  Diagnosis Date  . Anxiety   . Cholestasis   . Cholestasis during pregnancy   . Depression   . Depression, major, single episode, in partial remission ([place]) 12/12/2017  . Heart disease   . Hepatic steatosis   . History of cholestasis during pregnancy 12/12/2017   Delivered April 2019, fetal demise  . History of cold sores   . History of fetal demise, not currently pregnant 12/12/2017  . History of peripheral edema 04/25/2014  . History of pre-eclampsia   . Hypertension   . NASH (nonalcoholic steatohepatitis)   . Obesity   . S/P cesarean section 01/10/2019  . Status post vacuum-assisted vaginal delivery 10/22/17   fetal death  . Vitamin D deficiency    No Known Allergies Past Surgical History:  Procedure Laterality Date  . APPENDECTOMY    . CESAREAN SECTION N/A 01/10/2019   Procedure: CESAREAN SECTION;  Surgeon: Janyth Contes, MD;  Location: [facility];  Service: Obstetrics;  Laterality: N/A;  TRacyRNFA, Heather if she can  . LAPAROSCOPIC APPENDECTOMY N/A 08/04/2017   Procedure: APPENDECTOMY LAPAROSCOPIC;  Surgeon: Alphonsa Overall, MD;  Location: [facility];  Service: General;  Laterality: N/A;  .  WISDOM TOOTH EXTRACTION     Family History  Problem Relation Age of Onset  . Hyperlipidemia Mother   . Hypertension Mother   . Mental illness Father   . Hypertension Father   . Cancer Maternal Aunt        small cell  . Heart disease Maternal Grandmother        heart attack  . Early death Maternal Grandmother   . Prostate cancer Maternal Grandfather   . Pancreatic  cancer Maternal Grandfather   . Mental illness Paternal Grandmother   . Liver disease Neg Hx    Social History   Social History Narrative   Ms. Gundry lives with her husband Octavia Bruckner. She works FT as a Careers information officer, 8th grade. She enjoys traveling over seas.   She has a master's degree.   Drinks caffeine.    Daily vitamin use.    Wears her seatbelt. Smoke detector in the home.    Exercises 3x week.    Feels safe in her relationships     Allergies as of 12/17/2019   No Known Allergies     Medication List       Accurate as of December 17, 2019 10:07 AM. If you have any questions, ask your nurse or doctor.        PRENATAL AD PO Take by mouth.   tretinoin 0.025 % cream Commonly known as: RETIN-A SMARTSIG:Topical Every Evening   Vitamin D (Cholecalciferol) 10 MCG (400 UNIT) Caps Take by mouth.   vitamin E 200 UNIT capsule Take 200 Units by mouth daily.       All past medical history, surgical history, allergies, family history, immunizations andmedications were updated in the EMR today and reviewed under the history and medication portions of their EMR.     No results found for this or any previous visit (from the past 2160 hour(s)).  No results found.   ROS: 14 pt review of systems performed and negative (unless mentioned in an HPI)  Objective: BP 123/87 (BP Location: Left Arm, Patient Position: Sitting, Cuff Size: Large)   Pulse 72   Temp 98.7 F (37.1 C) (Temporal)   Resp 18   Ht 5' 4.1" (1.628 m)   Wt (!) 350 lb 4 oz (158.9 kg)   LMP 12/11/2019 (Exact Date)   SpO2 98%   Breastfeeding No   BMI 59.93 kg/m  Gen: Afebrile. No acute distress. Nontoxic in appearance, well-developed, well-nourished,  Pleasant female HENT: AT. [place]. Bilateral TM visualized and normal in appearance, normal external auditory canal. MMM, no oral lesions, adequate dentition. Bilateral nares within normal limits. Throat without erythema, ulcerations or exudates. no Cough on exam, no  hoarseness on exam. Eyes:Pupils Equal Round Reactive to light, Extraocular movements intact,  Conjunctiva without redness, discharge or icterus. Neck/lymp/endocrine: Supple,no lymphadenopathy, no thyromegaly CV: RRR no murmur, no edema, +2/4 P posterior tibialis pulses. Chest: CTAB, no wheeze, rhonchi or crackles. no Respiratory effort. normal Air movement. Abd: Soft. obese. NTND. BS present. no Masses palpated. No hepatosplenomegaly. No rebound tenderness or guarding. Skin: no rashes, purpura or petechiae. Warm and well-perfused. Skin intact. Neuro/Msk:  Normal gait. PERLA. EOMi. Alert. Oriented x3.  Cranial nerves II through XII intact. Muscle strength 5/5 upper/lower extremity. DTRs equal bilaterally. Psych: tearful when speaking about weight. Normal affect, dress and demeanor. Normal speech. Normal thought content and judgment.   No exam data present  Assessment/plan: Patricia Berry is a 34 y.o. female present for CPE Vitamin D deficiency - Vitamin  D (25 hydroxy) Elevated liver enzymes - Comprehensive metabolic panel Morbid obesity (HCC)/ Weight gain/BMI 50.0-59.9, adult ([place]) - pt frustrated over weight gain. Working out with trainer 5 days a week an counting calories.  - Lipid panel - discussed diet, exercise, HR for cardio/fat burn zone.  - glycemic index foods.  - TSH Screening for deficiency anemia - CBC Diabetes mellitus screening - Hemoglobin A1c Encounter for preventive health examination Patient was encouraged to exercise greater than 150 minutes a week. Patient was encouraged to choose a diet filled with fresh fruits and vegetables, and lean meats. AVS provided to patient today for education/recommendation on gender specific health and safety maintenance. Colonoscopy: no fhx. Routine screen at 56 Mammogram: no fhx, routine screen at 40 Cervical cancer screening: last pap: 06/2018, GYN Immunizations: tdap 10/2018, Influenza UTD (encouraged yearly), COVID vaccines  completed.  Infectious disease screening: HIV and Hep C completed DEXA: routine Return in about 1 year (around 12/16/2020) for CPE (30 min).   Orders Placed This Encounter  Procedures  . CBC  . Hemoglobin A1c  . Comprehensive metabolic panel  . Lipid panel  . TSH  . Vitamin D (25 hydroxy)   No orders of the defined types were placed in this encounter.  Referral Orders  No referral(s) requested today     Electronically signed by: Howard Pouch, [place]

## 2019-12-17 NOTE — Patient Instructions (Addendum)
Goal HR is 145-150.  Look into Low glycemic index diet/foods    Health Maintenance, Female Adopting a healthy lifestyle and getting preventive care are important in promoting health and wellness. Ask your health care provider about:  The right schedule for you to have regular tests and exams.  Things you can do on your own to prevent diseases and keep yourself healthy. What should I know about diet, weight, and exercise? Eat a healthy diet   Eat a diet that includes plenty of vegetables, fruits, low-fat dairy products, and lean protein.  Do not eat a lot of foods that are high in solid fats, added sugars, or sodium. Maintain a healthy weight Body mass index (BMI) is used to identify weight problems. It estimates body fat based on height and weight. Your health care provider can help determine your BMI and help you achieve or maintain a healthy weight. Get regular exercise Get regular exercise. This is one of the most important things you can do for your health. Most adults should:  Exercise for at least 150 minutes each week. The exercise should increase your heart rate and make you sweat (moderate-intensity exercise).  Do strengthening exercises at least twice a week. This is in addition to the moderate-intensity exercise.  Spend less time sitting. Even light physical activity can be beneficial. Watch cholesterol and blood lipids Have your blood tested for lipids and cholesterol at 33 years of age, then have this test every 5 years. Have your cholesterol levels checked more often if:  Your lipid or cholesterol levels are high.  You are older than 33 years of age.  You are at high risk for heart disease. What should I know about cancer screening? Depending on your health history and family history, you may need to have cancer screening at various ages. This may include screening for:  Breast cancer.  Cervical cancer.  Colorectal cancer.  Skin cancer.  Lung cancer. What  should I know about heart disease, diabetes, and high blood pressure? Blood pressure and heart disease  High blood pressure causes heart disease and increases the risk of stroke. This is more likely to develop in people who have high blood pressure readings, are of African descent, or are overweight.  Have your blood pressure checked: ? Every 3-5 years if you are 76-79 years of age. ? Every year if you are 72 years old or older. Diabetes Have regular diabetes screenings. This checks your fasting blood sugar level. Have the screening done:  Once every three years after age 27 if you are at a normal weight and have a low risk for diabetes.  More often and at a younger age if you are overweight or have a high risk for diabetes. What should I know about preventing infection? Hepatitis B If you have a higher risk for hepatitis B, you should be screened for this virus. Talk with your health care provider to find out if you are at risk for hepatitis B infection. Hepatitis C Testing is recommended for:  Everyone born from [birth year] through 1964.  Anyone with known risk factors for hepatitis C. Sexually transmitted infections (STIs)  Get screened for STIs, including gonorrhea and chlamydia, if: ? You are sexually active and are younger than 33 years of age. ? You are older than 33 years of age and your health care provider tells you that you are at risk for this type of infection. ? Your sexual activity has changed since you were last screened, and you  are at increased risk for chlamydia or gonorrhea. Ask your health care provider if you are at risk.  Ask your health care provider about whether you are at high risk for HIV. Your health care provider may recommend a prescription medicine to help prevent HIV infection. If you choose to take medicine to prevent HIV, you should first get tested for HIV. You should then be tested every 3 months for as long as you are taking the medicine. Pregnancy  If  you are about to stop having your period (premenopausal) and you may become pregnant, seek counseling before you get pregnant.  Take 400 to 800 micrograms (mcg) of folic acid every day if you become pregnant.  Ask for birth control (contraception) if you want to prevent pregnancy. Osteoporosis and menopause Osteoporosis is a disease in which the bones lose minerals and strength with aging. This can result in bone fractures. If you are 45 years old or older, or if you are at risk for osteoporosis and fractures, ask your health care provider if you should:  Be screened for bone loss.  Take a calcium or vitamin D supplement to lower your risk of fractures.  Be given hormone replacement therapy (HRT) to treat symptoms of menopause. Follow these instructions at home: Lifestyle  Do not use any products that contain nicotine or tobacco, such as cigarettes, e-cigarettes, and chewing tobacco. If you need help quitting, ask your health care provider.  Do not use street drugs.  Do not share needles.  Ask your health care provider for help if you need support or information about quitting drugs. Alcohol use  Do not drink alcohol if: ? Your health care provider tells you not to drink. ? You are pregnant, may be pregnant, or are planning to become pregnant.  If you drink alcohol: ? Limit how much you use to 0-1 drink a day. ? Limit intake if you are breastfeeding.  Be aware of how much alcohol is in your drink. In the [place] one drink equals one 12 oz bottle of beer (355 mL), one 5 oz glass of wine (148 mL), or one 1 oz glass of hard liquor (44 mL). General instructions  Schedule regular health, dental, and eye exams.  Stay current with your vaccines.  Tell your health care provider if: ? You often feel depressed. ? You have ever been abused or do not feel safe at home. Summary  Adopting a healthy lifestyle and getting preventive care are important in promoting health and  wellness.  Follow your health care provider's instructions about healthy diet, exercising, and getting tested or screened for diseases.  Follow your health care provider's instructions on monitoring your cholesterol and blood pressure. This information is not intended to replace advice given to you by your health care provider. Make sure you discuss any questions you have with your health care provider. Document Revised: 05/30/2018 Document Reviewed: 05/30/2018 Elsevier Patient Education  2020 [employer].

## 2019-12-18 NOTE — Telephone Encounter (Signed)
Pt was called and VM was left to return call  °

## 2019-12-18 NOTE — Telephone Encounter (Signed)
Pt was called and given lab results, she verbalized understanding.

## 2020-02-21 ENCOUNTER — Telehealth: Payer: Self-pay

## 2020-02-21 NOTE — Telephone Encounter (Signed)
Has muscle spasms, wants meds or recommendations on what to do, if not able to call in meds please let patient know, we can possible schedule virtual appt for her tomorrow with Saturday clinic doctor  Please call asap [phone]

## 2020-02-21 NOTE — Telephone Encounter (Signed)
Please advise, see message below

## 2020-02-21 NOTE — Telephone Encounter (Signed)
Please schedule patient in Saturday clinic. Thanks.

## 2020-02-21 NOTE — Telephone Encounter (Signed)
Left VM for CB

## 2020-02-22 ENCOUNTER — Encounter: Payer: Self-pay | Admitting: Family Medicine

## 2020-02-22 ENCOUNTER — Telehealth (INDEPENDENT_AMBULATORY_CARE_PROVIDER_SITE_OTHER): Payer: BC Managed Care – PPO | Admitting: Family Medicine

## 2020-02-22 DIAGNOSIS — M545 Low back pain, unspecified: Secondary | ICD-10-CM

## 2020-02-22 MED ORDER — CYCLOBENZAPRINE HCL 10 MG PO TABS: 10.0000 mg | ORAL_TABLET | Freq: Three times a day (TID) | ORAL | 0 refills | Status: DC | PRN

## 2020-02-22 MED ORDER — METHYLPREDNISOLONE 4 MG PO TBPK: ORAL_TABLET | ORAL | 0 refills | Status: DC

## 2020-02-22 NOTE — Progress Notes (Signed)
Subjective:    Patient ID: Patricia Berry, female    DOB: 1986-01-17, 34 y.o.   MRN: [id number]  HPI Virtual Visit via Video Note  I connected with the patient on 02/22/20 at 10:20 AM EDT by a video enabled telemedicine application and verified that I am speaking with the correct person using two identifiers.  Location patient: home Location provider:work or home office Persons participating in the virtual visit: patient, provider  I discussed the limitations of evaluation and management by telemedicine and the availability of in person appointments. The patient expressed understanding and agreed to proceed.   HPI: Here for the sudden onset 3 days ago of spasms and pain in the lower back. No pain in the legs. No recent trauma. Using heat and 600 mg Ibuprofen with no relief.    ROS: See pertinent positives and negatives per HPI.  Past Medical History:  Diagnosis Date   Anxiety    Cholestasis    Cholestasis during pregnancy    Depression    Depression, major, single episode, in partial remission ([place]) 12/12/2017   Heart disease    Hepatic steatosis    History of cholestasis during pregnancy 12/12/2017   Delivered April 2019, fetal demise   History of cold sores    History of fetal demise, not currently pregnant 12/12/2017   History of peripheral edema 04/25/2014   History of pre-eclampsia    Hypertension    NASH (nonalcoholic steatohepatitis)    Obesity    S/P cesarean section 01/10/2019   Status post vacuum-assisted vaginal delivery 10/14/17   fetal death   Vitamin D deficiency     Past Surgical History:  Procedure Laterality Date   APPENDECTOMY     CESAREAN SECTION N/A 01/10/2019   Procedure: CESAREAN SECTION;  Surgeon: Janyth Contes, MD;  [facility]: [facility];  Service: Obstetrics;  Laterality: N/A;  TRacyRNFA, Heather if she can   LAPAROSCOPIC APPENDECTOMY N/A 08/04/2017   Procedure: APPENDECTOMY LAPAROSCOPIC;  Surgeon: Alphonsa Overall, MD;   Location: [facility];  Service: General;  Laterality: N/A;   WISDOM TOOTH EXTRACTION      Family History  Problem Relation Age of Onset   Hyperlipidemia Mother    Hypertension Mother    Mental illness Father    Hypertension Father    Cancer Maternal Aunt        small cell   Heart disease Maternal Grandmother        heart attack   Early death Maternal Grandmother    Prostate cancer Maternal Grandfather    Pancreatic cancer Maternal Grandfather    Mental illness Paternal Grandmother    Liver disease Neg Hx      Current Outpatient Medications:    Prenatal Vit-DSS-Fe Cbn-FA (PRENATAL AD PO), Take by mouth., Disp: , Rfl:    tretinoin (RETIN-A) 0.025 % cream, SMARTSIG:Topical Every Evening, Disp: , Rfl:    Vitamin D, Cholecalciferol, 10 MCG (400 UNIT) CAPS, Take by mouth., Disp: , Rfl:    vitamin E 200 UNIT capsule, Take 200 Units by mouth daily., Disp: , Rfl:    cyclobenzaprine (FLEXERIL) 10 MG tablet, Take 1 tablet (10 mg total) by mouth 3 (three) times daily as needed for muscle spasms., Disp: 60 tablet, Rfl: 0   methylPREDNISolone (MEDROL DOSEPAK) 4 MG TBPK tablet, As directed, Disp: 21 tablet, Rfl: 0  EXAM:  VITALS per patient if applicable:  GENERAL: alert, oriented, appears well and in no acute distress  HEENT: atraumatic, conjunttiva clear, no obvious abnormalities on  inspection of external nose and ears  NECK: normal movements of the head and neck  LUNGS: on inspection no signs of respiratory distress, breathing rate appears normal, no obvious gross SOB, gasping or wheezing  CV: no obvious cyanosis  MS: moves all visible extremities without noticeable abnormality  PSYCH/NEURO: pleasant and cooperative, no obvious depression or anxiety, speech and thought processing grossly intact  ASSESSMENT AND PLAN: Low back pain. Given Flexeril and a Medrol dose pack. She can add ES Tylenol to this if needed.  Alysia Penna, MD  Discussed the following assessment  and plan:  No diagnosis found.     I discussed the assessment and treatment plan with the patient. The patient was provided an opportunity to ask questions and all were answered. The patient agreed with the plan and demonstrated an understanding of the instructions.   The patient was advised to call back or seek an in-person evaluation if the symptoms worsen or if the condition fails to improve as anticipated.     Review of Systems     Objective:   Physical Exam        Assessment & Plan:

## 2020-04-06 ENCOUNTER — Telehealth: Payer: Self-pay | Admitting: Family Medicine

## 2020-04-06 NOTE — Telephone Encounter (Signed)
Patient due for f/u with provider. LM requesting call back to schedule, will complete lab work at visit.

## 2020-04-06 NOTE — Telephone Encounter (Signed)
Patient states Dr. Raoul Pitch wanted her to come back for bloodwork, mainly to re-test VitaminD. Please enter orders so we can schedule patient.

## 2020-05-12 ENCOUNTER — Other Ambulatory Visit: Payer: Self-pay

## 2020-05-12 ENCOUNTER — Telehealth (INDEPENDENT_AMBULATORY_CARE_PROVIDER_SITE_OTHER): Payer: BC Managed Care – PPO | Admitting: Family Medicine

## 2020-05-12 DIAGNOSIS — R059 Cough, unspecified: Secondary | ICD-10-CM

## 2020-05-12 DIAGNOSIS — J029 Acute pharyngitis, unspecified: Secondary | ICD-10-CM

## 2020-05-12 MED ORDER — BENZONATATE 100 MG PO CAPS: 100.0000 mg | ORAL_CAPSULE | Freq: Three times a day (TID) | ORAL | 0 refills | Status: DC | PRN

## 2020-05-12 NOTE — Patient Instructions (Addendum)
   ---------------------------------------------------------------------------------------------------------------------------      WORK SLIP:  Patient Patricia Berry,  01/12/1986, was seen for a medical visit today, 05/12/20 . Please excuse from work according to the [facility] guidelines for a COVID like illness. We advise 10 days minimum from the onset of symptoms (05/10/20) PLUS 1 day of no fever and improved symptoms. Will defer to employer for a sooner return to work if [place] testing is negative and the symptoms have resolved. Advise following CDC guidelines.    Sincerely: E-signature: Dr. Colin Benton, DO [place] Ph: [phone]   ------------------------------------------------------------------------------------------------------------------------------   HOME CARE TIPS:  -[place] testing information: https://www.rivera-powers.org/ OR [phone] Most pharmacies also offer testing and home test kits.  -I sent the medication(s) we discussed to your pharmacy: Meds ordered this encounter  Medications  . benzonatate (TESSALON PERLES) 100 MG capsule    Sig: Take 1 capsule (100 mg total) by mouth 3 (three) times daily as needed.    Dispense:  20 capsule    Refill:  0     -COVID19 outpatient treatment center: [phone] (only call if your Covid test is positive and you are interested in monoclonal antibody treatment which is available to those with risk factors within 10 days of symptom onset)  -can use tylenol or aleve if needed for fevers, aches and pains per instructions  -can use nasal saline a few times per day if nasal congestion, sometime a short course of Afrin nasal spray for 3 days can help as well  -stay hydrated, drink plenty of fluids and eat small healthy meals - avoid dairy  -can take 1000 IU Vit D3 and Vit C lozenges per instructions  -If the Covid test is positive, check out the CDC website  for more information on home care, transmission and treatment for COVID19  -follow up with your doctor in 2-3 days unless improving and feeling better  -stay home while sick, except to seek medical care, and if you have COVID19 please stay home for a full 10 days since the onset of symptoms PLUS one day of no fever and feeling better.  It was nice to meet you today, and I really hope you are feeling better soon. I help [place] out with telemedicine visits on Tuesdays and Thursdays and am available for visits on those days. If you have any concerns or questions following this visit please schedule a follow up visit with your Primary Care doctor or seek care at a local urgent care clinic to avoid delays in care.    Seek in person care promptly if your symptoms worsen, new concerns arise or you are not improving with treatment. Call 911 and/or seek emergency care if you symptoms are severe or life threatening.

## 2020-05-12 NOTE — Progress Notes (Signed)
Virtual Visit via Video Note  I connected with Patricia Berry  on 05/12/20 at 12:40 PM EST by a video enabled telemedicine application and verified that I am speaking with the correct person using two identifiers.  Location patient: home, [place] Location provider:work or home office Persons participating in the virtual visit: patient, provider  I discussed the limitations of evaluation and management by telemedicine and the availability of in person appointments. The patient expressed understanding and agreed to proceed.   HPI:  Acute telemedicine visit for cough: -Onset: 2 days ago -Symptoms include: scratchy throat, cough, laryngitis, congestion, sore throat -reports other than this really does not feel bad -husband was sick last week, child in daycare, she is a Pharmacist, hospital -Denies:SOB, CP, wheezing, fevers, malaise -Has tried: none -Pertinent past medical history: morbid obesity -Pertinent medication allergies: nkda -COVID-19 vaccine status: fully vaccinated for covid19, has not gotten the flu shot yet  ROS: See pertinent positives and negatives per HPI.  Past Medical History:  Diagnosis Date   Anxiety    Cholestasis    Cholestasis during pregnancy    Depression    Depression, major, single episode, in partial remission ([place]) 12/12/2017   Heart disease    Hepatic steatosis    History of cholestasis during pregnancy 12/12/2017   Delivered April 2019, fetal demise   History of cold sores    History of fetal demise, not currently pregnant 12/12/2017   History of peripheral edema 04/25/2014   History of pre-eclampsia    Hypertension    NASH (nonalcoholic steatohepatitis)    Obesity    S/P cesarean section 01/10/2019   Status post vacuum-assisted vaginal delivery Oct 09, 2017   fetal death   Vitamin D deficiency     Past Surgical History:  Procedure Laterality Date   APPENDECTOMY     CESAREAN SECTION N/A 01/10/2019   Procedure: CESAREAN SECTION;  Surgeon:  Janyth Contes, MD;  [facility];  Service: Obstetrics;  Laterality: N/A;  TRacyRNFA, Heather if she can   LAPAROSCOPIC APPENDECTOMY N/A 08/04/2017   Procedure: APPENDECTOMY LAPAROSCOPIC;  Surgeon: Alphonsa Overall, MD;  Location: [facility];  Service: General;  Laterality: N/A;   WISDOM TOOTH EXTRACTION       Current Outpatient Medications:    benzonatate (TESSALON PERLES) 100 MG capsule, Take 1 capsule (100 mg total) by mouth 3 (three) times daily as needed., Disp: 20 capsule, Rfl: 0   cyclobenzaprine (FLEXERIL) 10 MG tablet, Take 1 tablet (10 mg total) by mouth 3 (three) times daily as needed for muscle spasms., Disp: 60 tablet, Rfl: 0   methylPREDNISolone (MEDROL DOSEPAK) 4 MG TBPK tablet, As directed, Disp: 21 tablet, Rfl: 0   Prenatal Vit-DSS-Fe Cbn-FA (PRENATAL AD PO), Take by mouth., Disp: , Rfl:    tretinoin (RETIN-A) 0.025 % cream, SMARTSIG:Topical Every Evening, Disp: , Rfl:    Vitamin D, Cholecalciferol, 10 MCG (400 UNIT) CAPS, Take by mouth., Disp: , Rfl:    vitamin E 200 UNIT capsule, Take 200 Units by mouth daily., Disp: , Rfl:   EXAM:  VITALS per patient if applicable:  GENERAL: alert, oriented, appears well and in no acute distress  HEENT: atraumatic, conjunttiva clear, no obvious abnormalities on inspection of external nose and ears  NECK: normal movements of the head and neck  LUNGS: on inspection no signs of respiratory distress, breathing rate appears normal, no obvious gross SOB, gasping or wheezing  CV: no obvious cyanosis  MS: moves all visible extremities without noticeable abnormality  PSYCH/NEURO: pleasant and cooperative,  no obvious depression or anxiety, speech and thought processing grossly intact  ASSESSMENT AND PLAN:  Discussed the following assessment and plan:  Cough  Sore throat  -we discussed possible serious and likely etiologies, options for evaluation and workup, limitations of telemedicine visit vs in person visit,  treatment, treatment risks and precautions. Pt prefers to treat via telemedicine empirically rather than in person at this moment.  Query viral upper respiratory illness, laryngitis versus other.  Discussed options for Covid testing, treatment, potential complications and precautions.  Suspect other viral infection more likely.  Discussed symptomatic care with nasal saline if needed, Tessalon for cough, prescription sent, analgesics if needed.  Advised staying home while sick and per guidelines if Covid test is positive. Work/School slipped offered: provided in patient instructions  Scheduled follow up with PCP offered: She agrees to follow-up if needed. Advised to seek prompt in person care if worsening, new symptoms arise, or if is not improving with treatment. Discussed options for inperson care if PCP office not available. Did let this patient know that I only do telemedicine on Tuesdays and Thursdays for [place]. Advised to schedule follow up visit with PCP or [facility] if any further questions or concerns to avoid delays in care.   I discussed the assessment and treatment plan with the patient. The patient was provided an opportunity to ask questions and all were answered. The patient agreed with the plan and demonstrated an understanding of the instructions.     Lucretia Kern, DO

## 2020-05-19 ENCOUNTER — Telehealth (INDEPENDENT_AMBULATORY_CARE_PROVIDER_SITE_OTHER): Payer: BC Managed Care – PPO | Admitting: Family Medicine

## 2020-05-19 DIAGNOSIS — R112 Nausea with vomiting, unspecified: Secondary | ICD-10-CM | POA: Diagnosis not present

## 2020-05-19 DIAGNOSIS — R197 Diarrhea, unspecified: Secondary | ICD-10-CM | POA: Diagnosis not present

## 2020-05-19 MED ORDER — ONDANSETRON HCL 4 MG PO TABS: 4.0000 mg | ORAL_TABLET | Freq: Three times a day (TID) | ORAL | 0 refills | Status: DC | PRN

## 2020-05-19 NOTE — Patient Instructions (Signed)
   ---------------------------------------------------------------------------------------------------------------------------      WORK SLIP:  Patient Patricia Berry,  06/23/1985, was seen for a medical visit today, 05/19/20 . Please excuse from work. We advise 2-3 days minimum of resolution of symptoms PLUS a negative covid19 test prior to return to work.  Sincerely: E-signature: Dr. Colin Benton, DO [place] Ph: [phone]   ------------------------------------------------------------------------------------------------------------------------------    HOME CARE TIPS:  -[place] testing information: https://www.rivera-powers.org/ OR [phone] Most pharmacies also offer testing and home test kits.  -I sent the medication(s) we discussed to your pharmacy: Meds ordered this encounter  Medications  . ondansetron (ZOFRAN) 4 MG tablet    Sig: Take 1 tablet (4 mg total) by mouth every 8 (eight) hours as needed for nausea or vomiting.    Dispense:  10 tablet    Refill:  0     -Imodium if any further diarrhea can be used as needed per instructions  -can use tylenol or aleve if needed for fevers, aches and pains per instructions  -stay hydrated, drink plenty of fluids (sip at least several large glasses of liquids with electrolytes today ) and eat small healthy meals - avoid dairy   -follow up with your doctor in 2-3 days unless improving and feeling better  -stay home while sick, except to seek medical care, and if you have COVID19 please stay home for a full 10 days since the onset of symptoms PLUS one day of no fever and feeling better.  It was nice to meet you today, and I really hope you are feeling better soon. I help [name] out with telemedicine visits on Tuesdays and Thursdays and am available for visits on those days. If you have any concerns or questions following this visit please schedule a  follow up visit with your Primary Care doctor or seek care at a local urgent care clinic to avoid delays in care.    Seek in person care promptly if your symptoms worsen, new concerns arise or you are not improving with treatment. Call 911 and/or seek emergency care if you symptoms are severe or life threatening.

## 2020-05-19 NOTE — Progress Notes (Signed)
Virtual Visit via Video Note  I connected with [place]  on 05/19/20 at  5:00 PM EST by a video enabled telemedicine application and verified that I am speaking with the correct person using two identifiers.  Location patient: home, [place] Location provider:work or home office Persons participating in the virtual visit: patient, provider  I discussed the limitations of evaluation and management by telemedicine and the availability of in person appointments. The patient expressed understanding and agreed to proceed.   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  Past Medical History:  Diagnosis Date  . Anxiety   . Cholestasis   . Cholestasis during pregnancy   . Depression   . Depression, major, single episode, in partial remission ([place]) 12/12/2017  . Heart disease   . Hepatic steatosis   . History of cholestasis during pregnancy 12/12/2017   Delivered April 2019, fetal demise  . History of cold sores   . History of fetal demise, not currently pregnant 12/12/2017  . History of peripheral edema 04/25/2014  . History of pre-eclampsia   . Hypertension   . NASH (nonalcoholic steatohepatitis)   . Obesity   . S/P cesarean section 01/10/2019  . Status post vacuum-assisted vaginal delivery 10-06-17   fetal death  . Vitamin D deficiency     Past Surgical History:  Procedure Laterality Date  . APPENDECTOMY    . CESAREAN SECTION N/A 01/10/2019   Procedure: CESAREAN SECTION;  Surgeon: Janyth Contes, MD;  Location: [facility] LD ORS;  Service: Obstetrics;  Laterality: N/A;  TRacyRNFA, Heather if she can  . LAPAROSCOPIC  APPENDECTOMY N/A 08/04/2017   Procedure: APPENDECTOMY LAPAROSCOPIC;  Surgeon: Alphonsa Overall, MD;  Location: [facility];  Service: General;  Laterality: N/A;  . WISDOM TOOTH EXTRACTION       Current Outpatient Medications:  .  benzonatate (TESSALON PERLES) 100 MG capsule, Take 1 capsule (100 mg total) by mouth 3 (three) times daily as needed., Disp: 20 capsule, Rfl: 0 .  cyclobenzaprine (FLEXERIL) 10 MG tablet, Take 1 tablet (10 mg total) by mouth 3 (three) times daily as needed for muscle spasms., Disp: 60 tablet, Rfl: 0 .  methylPREDNISolone (MEDROL DOSEPAK) 4 MG TBPK tablet, As directed, Disp: 21 tablet, Rfl: 0 .  ondansetron (ZOFRAN) 4 MG tablet, Take 1 tablet (4 mg total) by mouth every 8 (eight) hours as needed for nausea or vomiting., Disp: 10 tablet, Rfl: 0 .  Prenatal Vit-DSS-Fe Cbn-FA (PRENATAL AD PO), Take by mouth., Disp: , Rfl:  .  tretinoin (RETIN-A) 0.025 % cream, SMARTSIG:Topical Every Evening, Disp: , Rfl:  .  Vitamin D, Cholecalciferol, 10 MCG (400 UNIT) CAPS, Take by mouth., Disp: , Rfl:  .  vitamin E 200 UNIT capsule, Take 200 Units by mouth daily., Disp: , Rfl:   EXAM:  VITALS per patient if applicable:  GENERAL: alert, oriented, appears well and in no acute distress  HEENT: atraumatic, conjunttiva clear, no obvious abnormalities on inspection of external nose and ears  NECK: normal movements of the head and neck  LUNGS: on inspection no signs of respiratory distress, breathing rate appears normal, no obvious gross SOB, gasping or wheezing  CV: no obvious cyanosis  MS: moves  all visible extremities without noticeable abnormality  PSYCH/NEURO: pleasant and cooperative, no obvious depression or anxiety, speech and thought processing grossly intact  ASSESSMENT AND PLAN:  Discussed the following assessment and plan:  Nausea and vomiting, intractability of vomiting not specified, unspecified vomiting type  Diarrhea, unspecified type  -we discussed possible serious  and likely etiologies, options for evaluation and workup, limitations of telemedicine visit vs in person visit, treatment, treatment risks and precautions. Pt prefers to treat via telemedicine empirically rather than in person at this moment.  Suspect viral gastroenteritis most likely versus other.  She had a respiratory illness last week, which she reports had completely resolved before this started.  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  Discussed possibility of other diagnosis, Covid testing options, potential complications and precautions.  For the diarrhea suggested trial of Imodium, sent Zofran for vomiting to use as needed.  Advised oral hydration and avoiding dairy and red meat while sick. Work/School slipped offered: provided in patient instructions   Scheduled follow up with PCP offered: Agrees to follow-up if needed Advised to seek prompt in person care if worsening, new symptoms arise, or if is not improving with treatment. Discussed options for inperson care if PCP office not available. Did let this patient know that I only do telemedicine on Tuesdays and Thursdays for [place]. Advised to schedule follow up visit with PCP or [facility] if any further questions or concerns to avoid delays in care.   I discussed the assessment and treatment plan with the patient. The patient was provided an opportunity to ask questions and all were answered. The patient agreed with the plan and demonstrated an understanding of the instructions.     Lucretia Kern, DO

## 2020-05-26 ENCOUNTER — Telehealth: Payer: Self-pay

## 2020-05-26 NOTE — Telephone Encounter (Signed)
Spoke with patient and she has already contacted OB/GYN. Soonest available appt was tomorrow afternoon at 2pm. She will be seen at that time.  [facility] - Client TELEPHONE ADVICE RECORD [facility] Patient Name: [facility] [place] Gender: Female DOB: Jan 08, 1986 Age: 34 Y 1 M 24 D Return Phone Number: [phone] (Primary) Address: City/State/Zip:  [place] [address] Client [facility] - Client Client Site [place] - Day Physician Raoul Pitch, [place] Contact Type Call Who Is Calling Patient / Member / Family / Caregiver Call Type Triage / Clinical Relationship To Patient Self Return Phone Number [phone] (Primary) Chief Complaint Headache Reason for Call Symptomatic / Request for [place] states she has been bleeding since Thursday and her OBGYN told her it wasn't a miscarriage. She is bleeding extremely heavy with headache and cramping. She has had issues in the past with her kidneys and is worried it has something to do with it. She is unsure what to do. This is very out of the norm for her. She is not pregnant. [place] Not Listed Will call OB/ GYN Translation No Nurse Assessment Nurse: Rock Nephew, RN, Juliann Pulse Date/Time (Eastern Time): 05/26/2020 10:34:37 AM Confirm and document reason for call. If symptomatic, describe symptoms. ---Caller states she has been bleeding since Thursday and her OB/ GYN told her it wasn't a miscarriage. She is bleeding extremely heavy ( having to change pad/tampon every hour ) and has a headache and cramping. Her OB / GYN also did lab work and those results are pending . Does the patient have any new or worsening symptoms? ---Yes Will a triage be completed? ---Yes Related visit to physician within the last 2 weeks? ---Yes Does the PT have any chronic conditions? (i.e. diabetes, asthma, this includes High risk factors for pregnancy, etc.) ---No Is  the patient pregnant or possibly pregnant? (Ask all females between the ages of 55-55) ---No Is this a behavioral health or substance abuse call? ---No Guidelines Guideline Title Affirmed Question Affirmed Notes Nurse Date/Time (Eastern Time) Vaginal Bleeding - Abnormal MODERATE vaginal bleeding (e.g., soaking 1 pad or tampon per hour and present > 6 hours; Rock Nephew, RN, Juliann Pulse 05/26/2020 10:35:10 AM PLEASE NOTE: All timestamps contained within this report are represented as [place] Standard Time. CONFIDENTIALTY NOTICE: This fax transmission is intended only for the addressee. It contains information that is legally privileged, confidential or otherwise protected from use or disclosure. If you are not the intended recipient, you are strictly prohibited from reviewing, disclosing, copying using or disseminating any of this information or taking any action in reliance on or regarding this information. If you have received this fax in error, please notify us immediately by telephone so that we can arrange for its return to [place]. Phone: [phone], Toll-Free: [phone], Fax: [phone] Page: 2 of 2 Call Id: [id number] Guidelines Guideline Title Affirmed Question Affirmed Notes Nurse Date/Time Eilene Ghazi Time) 1 menstrual cup every 6 hours) Disp. Time Eilene Ghazi Time) Disposition Final User 05/26/2020 10:38:18 AM See HCP within 4 Hours (or PCP triage) Yes Rock Nephew, RN, Gara Kroner Disagree/Comply Comply Caller Understands Yes PreDisposition Call Doctor Care Advice Given Per Guideline SEE HCP (OR PCP TRIAGE) WITHIN 4 HOURS: * IF OFFICE WILL BE OPEN: You need to be seen within the next 3 or 4 hours. Call your doctor (or NP/PA) now or as soon as the office opens. CALL BACK IF: * You become worse CARE ADVICE given per Vaginal Bleeding, Abnormal (Adult) guideline.  Referrals GO TO FACILITY OTHER - SPECIFY

## 2020-06-23 LAB — HM PAP SMEAR: HM Pap smear: NORMAL

## 2020-07-30 ENCOUNTER — Encounter: Payer: Self-pay | Admitting: Obstetrics and Gynecology

## 2020-08-13 LAB — OB RESULTS CONSOLE ANTIBODY SCREEN: Antibody Screen: NEGATIVE

## 2020-08-13 LAB — OB RESULTS CONSOLE RUBELLA ANTIBODY, IGM: Rubella: IMMUNE

## 2020-08-13 LAB — HEMOGLOBIN A1C: Hemoglobin A1C: 4.9

## 2020-08-13 LAB — OB RESULTS CONSOLE GC/CHLAMYDIA
Chlamydia: NEGATIVE
Gonorrhea: NEGATIVE

## 2020-08-13 LAB — OB RESULTS CONSOLE ABO/RH: RH Type: POSITIVE

## 2020-08-13 LAB — OB RESULTS CONSOLE HEPATITIS B SURFACE ANTIGEN: Hepatitis B Surface Ag: NEGATIVE

## 2020-08-13 LAB — OB RESULTS CONSOLE RPR: RPR: NONREACTIVE

## 2020-08-13 LAB — OB RESULTS CONSOLE HIV ANTIBODY (ROUTINE TESTING): HIV: NONREACTIVE

## 2020-09-22 ENCOUNTER — Other Ambulatory Visit: Payer: Self-pay | Admitting: Obstetrics and Gynecology

## 2020-10-14 ENCOUNTER — Encounter: Payer: Self-pay | Admitting: Obstetrics and Gynecology

## 2020-10-19 ENCOUNTER — Other Ambulatory Visit: Payer: Self-pay | Admitting: Obstetrics and Gynecology

## 2020-10-19 DIAGNOSIS — Z363 Encounter for antenatal screening for malformations: Secondary | ICD-10-CM

## 2020-10-19 DIAGNOSIS — Z3689 Encounter for other specified antenatal screening: Secondary | ICD-10-CM

## 2020-10-30 ENCOUNTER — Encounter: Payer: Self-pay | Admitting: *Deleted

## 2020-11-05 ENCOUNTER — Other Ambulatory Visit: Payer: Self-pay | Admitting: Maternal & Fetal Medicine

## 2020-11-05 ENCOUNTER — Encounter: Payer: Self-pay | Admitting: *Deleted

## 2020-11-05 ENCOUNTER — Ambulatory Visit: Payer: BC Managed Care – PPO | Admitting: *Deleted

## 2020-11-05 ENCOUNTER — Other Ambulatory Visit: Payer: Self-pay

## 2020-11-05 ENCOUNTER — Ambulatory Visit (HOSPITAL_BASED_OUTPATIENT_CLINIC_OR_DEPARTMENT_OTHER): Payer: BC Managed Care – PPO | Admitting: Maternal & Fetal Medicine

## 2020-11-05 ENCOUNTER — Ambulatory Visit: Payer: BC Managed Care – PPO | Attending: Obstetrics and Gynecology

## 2020-11-05 VITALS — BP 121/74 | HR 83

## 2020-11-05 DIAGNOSIS — Z3A23 23 weeks gestation of pregnancy: Secondary | ICD-10-CM

## 2020-11-05 DIAGNOSIS — O132 Gestational [pregnancy-induced] hypertension without significant proteinuria, second trimester: Secondary | ICD-10-CM

## 2020-11-05 DIAGNOSIS — O99212 Obesity complicating pregnancy, second trimester: Secondary | ICD-10-CM

## 2020-11-05 DIAGNOSIS — K831 Obstruction of bile duct: Secondary | ICD-10-CM

## 2020-11-05 DIAGNOSIS — O4440 Low lying placenta NOS or without hemorrhage, unspecified trimester: Secondary | ICD-10-CM

## 2020-11-05 DIAGNOSIS — Z8759 Personal history of other complications of pregnancy, childbirth and the puerperium: Secondary | ICD-10-CM | POA: Diagnosis present

## 2020-11-05 DIAGNOSIS — Z3689 Encounter for other specified antenatal screening: Secondary | ICD-10-CM | POA: Insufficient documentation

## 2020-11-05 DIAGNOSIS — O09292 Supervision of pregnancy with other poor reproductive or obstetric history, second trimester: Secondary | ICD-10-CM

## 2020-11-05 DIAGNOSIS — O26612 Liver and biliary tract disorders in pregnancy, second trimester: Secondary | ICD-10-CM

## 2020-11-05 DIAGNOSIS — Z363 Encounter for antenatal screening for malformations: Secondary | ICD-10-CM | POA: Diagnosis not present

## 2020-11-06 NOTE — Progress Notes (Signed)
MFM Consultation:   Date of service 11/05/20 Reason for request: elevated Maternal BMI and other risk factors Requesting provider: Paula Compton, MD  Patricia Berry is a 35 yo G3P1 who is here at 7 w 1 d dated by a 68 w 0 d exam with an EDD of 03/04/21.  Patricia Berry is being seen at the request or Dr. Marvel Plan.  She is overall doing well today without complaints. She denies experiencing vaginal bleeding or uterine contractions.  She had a low risk NIPS and a detailed anatomy that was suboptimal for fetal heart views. In addition she had a negative 1hr GTT. Her baseline preeclampsia labs are normal including CBC, CMP and UPC.  Her pregnancy history is significant for the following:  1) Morbid obesity- Patricia Berry has a BMI of 60. She is currently taking low dose aspirin for preeclampsia prevention.  2) She had a child that passed secondary to HIE that occurred during delivery. She had a subsequent pregnancy that was delivered via cesarean delivery without complication.  3) History of cholestasis of pregnancy in the first pregnancy not in the second pregnancy.  4) History of preeclampsia and gestation hypertension  OB History  Gravida Para Term Preterm AB Living  3 2 2  0 0 1  SAB IAB Ectopic Multiple Live Births  0 0 0 0 2    # Outcome Date GA Lbr Len/2nd Weight Sex Delivery Anes PTL Lv  3 Current           2 Term 01/10/19 [redacted]w[redacted]d 8 lb 11.9 oz (3.965 kg) M CS-LTranv Spinal  LIV     Name: Patricia Berry,Patricia Berry     Apgar1: 8  Apgar5: 9  1 Term 005/13/2019363w3d1:41 / 00:20 5 lb 1.1 oz (2.3 kg) F Vag-Vacuum EPI  ND     Name: Patricia Berry,Patricia Berry     Apgar1: 0  Apgar5: 0     Vitals with BMI 11/05/2020 12/17/2019 09/10/2019  Height - 5' 4.1" 5' 4"   Weight - 350 lbs 4 oz (No Data)  BMI - 5965.78  Systolic [id number]  Diastolic 74 87 -  Pulse 83 72 -   Past Medical History:  Diagnosis Date  . Anxiety   . Cholestasis   . Cholestasis during pregnancy   . Depression   . Depression,  major, single episode, in partial remission ([facility]/25/2019  . Heart disease   . Hepatic steatosis   . History of cholestasis during pregnancy 12/12/2017   Delivered April 2019, fetal demise  . History of cold sores   . History of fetal demise, not currently pregnant 12/12/2017  . History of peripheral edema 04/25/2014  . History of pre-eclampsia   . Hypertension   . NASH (nonalcoholic steatohepatitis)   . Obesity   . S/P cesarean section 01/10/2019  . Status post vacuum-assisted vaginal delivery 042019-05-13 fetal death  . Vitamin D deficiency    Past Surgical History:  Procedure Laterality Date  . APPENDECTOMY    . CESAREAN SECTION N/A 01/10/2019   Procedure: CESAREAN SECTION;  Surgeon: BoJanyth ContesMD;  [facility];  Service: Obstetrics;  Laterality: N/A;  TRacyRNFA, Heather if she can  . LAPAROSCOPIC APPENDECTOMY N/A 08/04/2017   Procedure: APPENDECTOMY LAPAROSCOPIC;  Surgeon: NeAlphonsa OverallMD;  Location: [facility];  Service: General;  Laterality: N/A;  . WISDOM TOOTH EXTRACTION     Family History  Problem Relation Age of Onset  . Hyperlipidemia Mother   . Hypertension  Mother   . Mental illness Father   . Hypertension Father   . Cancer Maternal Aunt        small cell  . Heart disease Maternal Grandmother        heart attack  . Early death Maternal Grandmother   . Prostate cancer Maternal Grandfather   . Pancreatic cancer Maternal Grandfather   . Mental illness Paternal Grandmother   . Liver disease Neg Hx    Social History   Socioeconomic History  . Marital status: Married    Spouse name: Octavia Bruckner   . Number of children: 0  . Years of education: 59  . Highest education level: Not on file  Occupational History  . Occupation: Pharmacist, hospital    Comment: [employer]  Tobacco Use  . Smoking status: Never Smoker  . Smokeless tobacco: Never Used  Vaping Use  . Vaping Use: Never used  Substance and Sexual Activity  . Alcohol use: Not Currently     Alcohol/week: 2.0 standard drinks    Types: 2 Glasses of wine per week  . Drug use: No  . Sexual activity: Yes    Partners: Male    Birth control/protection: Condom    Comment: married  Other Topics Concern  . Not on file  Social History Narrative   Patricia Berry lives with her husband Octavia Bruckner. She works FT as a Careers information officer, 8th grade. She enjoys traveling over seas.   She has a master's degree.   Drinks caffeine.    Daily vitamin use.    Wears her seatbelt. Smoke detector in the home.    Exercises 3x week.    Feels safe in her relationships    Social Determinants of Health   Financial Resource Strain: Not on file  Food Insecurity: Not on file  Transportation Needs: Not on file  Physical Activity: Not on file  Stress: Not on file  Social Connections: Not on file  Intimate Partner Violence: Not on file   Current Outpatient Medications on File Prior to Visit  Medication Sig Dispense Refill  . aspirin EC 81 MG tablet Take 81 mg by mouth daily. Swallow whole.    . Prenatal Vit-Fe Fum-FA-Omega (ONE-A-DAY WOMENS PRENATAL PO) Take by mouth.    . Vitamin D, Cholecalciferol, 10 MCG (400 UNIT) CAPS Take by mouth.     No current facility-administered medications on file prior to visit.   No Known Allergies  Imaging:  Single intrauterine pregnancy consistent with dates. Suboptimal views of the fetal anatomy were seen secondary to fetal position and maternal habitus. Follow up growth and anatomy scheduled in 4-6 weeks.  Impression/counseling:  I discussed with Patricia Berry that a BMI > 40 places the pregnancy at risk for preeclampsia, thrombosis in pregnancy especially after a cesarean delivery, stillbirth, gestational diabetes and fetal macrosomia.   I discussed the weight gain goal of the pregnancy should be 11-20 lbs. In addition, we recommend serial growth exams every 4 weeks with weekly testing beginning at 36 weeks, unless an additional diagnosis requires earlier  testing.  With regards to the prior delivery resulting in HIE I am in agreement that a repeat cesarean delivery is the prudent mode of delivery going forward.  We discussed the increased risk for recurrent cholestasis of pregnancy. Screening is not necessary at this time however, symptom reporting was encouraged with follow up LFT's and bile acids.  Lastly, we discussed the she has 25-50% chance for preeclampsia and that the administration of daily low dose  ASA is encouraged to prevent recurrence. We reviewed the signs and symptoms of preeclampsia, which she is aware. Baseline labs are normal. I encouraged continued routine prenatal care.  All questions answered   I spent 60 minute with > 50% in face to face consultation.  Vikki Ports, MD.

## 2020-12-03 ENCOUNTER — Encounter: Payer: Self-pay | Admitting: *Deleted

## 2020-12-03 ENCOUNTER — Ambulatory Visit: Payer: BC Managed Care – PPO | Admitting: *Deleted

## 2020-12-03 ENCOUNTER — Ambulatory Visit: Payer: BC Managed Care – PPO | Attending: Maternal & Fetal Medicine

## 2020-12-03 ENCOUNTER — Other Ambulatory Visit: Payer: Self-pay

## 2020-12-03 DIAGNOSIS — O99212 Obesity complicating pregnancy, second trimester: Secondary | ICD-10-CM | POA: Insufficient documentation

## 2020-12-03 DIAGNOSIS — K831 Obstruction of bile duct: Secondary | ICD-10-CM

## 2020-12-03 DIAGNOSIS — Z362 Encounter for other antenatal screening follow-up: Secondary | ICD-10-CM | POA: Diagnosis not present

## 2020-12-03 DIAGNOSIS — O132 Gestational [pregnancy-induced] hypertension without significant proteinuria, second trimester: Secondary | ICD-10-CM | POA: Insufficient documentation

## 2020-12-03 DIAGNOSIS — K760 Fatty (change of) liver, not elsewhere classified: Secondary | ICD-10-CM

## 2020-12-03 DIAGNOSIS — Z3A27 27 weeks gestation of pregnancy: Secondary | ICD-10-CM

## 2020-12-03 DIAGNOSIS — O26612 Liver and biliary tract disorders in pregnancy, second trimester: Secondary | ICD-10-CM

## 2020-12-03 DIAGNOSIS — Z8759 Personal history of other complications of pregnancy, childbirth and the puerperium: Secondary | ICD-10-CM

## 2020-12-03 DIAGNOSIS — O09292 Supervision of pregnancy with other poor reproductive or obstetric history, second trimester: Secondary | ICD-10-CM

## 2020-12-31 ENCOUNTER — Other Ambulatory Visit (HOSPITAL_COMMUNITY): Payer: Self-pay | Admitting: Obstetrics and Gynecology

## 2020-12-31 ENCOUNTER — Other Ambulatory Visit: Payer: Self-pay

## 2020-12-31 ENCOUNTER — Ambulatory Visit (HOSPITAL_COMMUNITY)
Admission: RE | Admit: 2020-12-31 | Discharge: 2020-12-31 | Disposition: A | Discharge status: Home / Self Care | Payer: BC Managed Care – PPO | Source: Ambulatory Visit | Attending: Cardiovascular Disease | Admitting: Cardiovascular Disease

## 2020-12-31 DIAGNOSIS — M79669 Pain in unspecified lower leg: Secondary | ICD-10-CM | POA: Insufficient documentation

## 2020-12-31 DIAGNOSIS — R600 Localized edema: Secondary | ICD-10-CM

## 2020-12-31 DIAGNOSIS — M7989 Other specified soft tissue disorders: Secondary | ICD-10-CM | POA: Diagnosis not present

## 2021-01-14 ENCOUNTER — Telehealth: Payer: BC Managed Care – PPO | Admitting: Family Medicine

## 2021-02-16 NOTE — Patient Instructions (Signed)
Patricia Berry  02/16/2021   Your procedure is scheduled on:  02/25/2021  Arrive at 65 at Entrance C on [address] at [facility]  and [address]. You are invited to use the FREE valet parking or use the Visitor's parking deck.  Pick up the phone at the desk and dial [phone].  Call this number if you have problems the morning of surgery: [phone]  Remember:   Do not eat food:(After Midnight) Desps de medianoche.  Do not drink clear liquids: (After Midnight) Desps de medianoche.  Take these medicines the morning of surgery with A SIP OF WATER:  none   Do not wear jewelry, make-up or nail polish.  Do not wear lotions, powders, or perfumes. Do not wear deodorant.  Do not shave 48 hours prior to surgery.  Do not bring valuables to the hospital.  [facility] is not   responsible for any belongings or valuables brought to the hospital.  Contacts, dentures or bridgework Patricia not be worn into surgery.  Leave suitcase in the car. After surgery it Patricia be brought to your room.  For patients admitted to the hospital, checkout time is 11:00 AM the day of              discharge.      Please read over the following fact sheets that you were given:     Preparing for Surgery

## 2021-02-17 ENCOUNTER — Encounter (HOSPITAL_COMMUNITY): Payer: Self-pay

## 2021-02-19 ENCOUNTER — Inpatient Hospital Stay (HOSPITAL_COMMUNITY)
Admission: AD | Admit: 2021-02-19 | Discharge: 2021-02-22 | DRG: 788 | Disposition: A | Discharge status: Home / Self Care | Payer: BC Managed Care – PPO | Attending: Obstetrics and Gynecology | Admitting: Obstetrics and Gynecology

## 2021-02-19 ENCOUNTER — Encounter (HOSPITAL_COMMUNITY): Payer: Self-pay | Admitting: Obstetrics and Gynecology

## 2021-02-19 ENCOUNTER — Other Ambulatory Visit: Payer: Self-pay

## 2021-02-19 DIAGNOSIS — Z98891 History of uterine scar from previous surgery: Secondary | ICD-10-CM

## 2021-02-19 DIAGNOSIS — O34211 Maternal care for low transverse scar from previous cesarean delivery: Principal | ICD-10-CM | POA: Diagnosis present

## 2021-02-19 DIAGNOSIS — Z3A38 38 weeks gestation of pregnancy: Secondary | ICD-10-CM

## 2021-02-19 DIAGNOSIS — O99214 Obesity complicating childbirth: Secondary | ICD-10-CM | POA: Diagnosis present

## 2021-02-19 DIAGNOSIS — O134 Gestational [pregnancy-induced] hypertension without significant proteinuria, complicating childbirth: Secondary | ICD-10-CM | POA: Diagnosis present

## 2021-02-19 MED ORDER — LABETALOL HCL 5 MG/ML IV SOLN: 80.0000 mg | INTRAVENOUS | Status: DC | PRN

## 2021-02-19 MED ORDER — LABETALOL HCL 5 MG/ML IV SOLN
20.0000 mg | INTRAVENOUS | Status: DC | PRN
  Administered 2021-02-19: 20 mg via INTRAVENOUS
  Filled 2021-02-19: qty 4

## 2021-02-19 MED ORDER — LABETALOL HCL 5 MG/ML IV SOLN
80.0000 mg | INTRAVENOUS | Status: DC | PRN
  Administered 2021-02-20: 80 mg via INTRAVENOUS
  Filled 2021-02-19: qty 16

## 2021-02-19 MED ORDER — MAGNESIUM SULFATE 40 GM/1000ML IV SOLN
2.0000 g/h | INTRAVENOUS | Status: DC
  Administered 2021-02-20 (×3): 2 g/h via INTRAVENOUS
  Filled 2021-02-19 (×2): qty 1000

## 2021-02-19 MED ORDER — LABETALOL HCL 5 MG/ML IV SOLN: 40.0000 mg | INTRAVENOUS | Status: DC | PRN

## 2021-02-19 MED ORDER — LABETALOL HCL 5 MG/ML IV SOLN
40.0000 mg | INTRAVENOUS | Status: DC | PRN
  Administered 2021-02-20: 40 mg via INTRAVENOUS
  Filled 2021-02-19: qty 8

## 2021-02-19 MED ORDER — HYDRALAZINE HCL 20 MG/ML IJ SOLN: 10.0000 mg | INTRAMUSCULAR | Status: DC | PRN

## 2021-02-19 MED ORDER — MAGNESIUM SULFATE BOLUS VIA INFUSION
4.0000 g | Freq: Once | INTRAVENOUS | Status: AC
  Administered 2021-02-20: 4 g via INTRAVENOUS
  Filled 2021-02-19: qty 1000

## 2021-02-19 MED ORDER — LABETALOL HCL 5 MG/ML IV SOLN: 20.0000 mg | INTRAVENOUS | Status: DC | PRN

## 2021-02-19 NOTE — H&P (Signed)
Patricia Berry is a 35 y.o. female presenting for rule out ROM. Questionable slow leak of clear fluid without odor since using toilet at 2000. +FM, denies VB, CTX. Denies PreE symptoms  PNC c/b 1) BMI 67 - baseline A1c 4.9, early 1hr 102 2) H/o loss - G1 passed from HIE 3) H/o csx x1 - PLTCS in G2 given birth trauma 4) H/o cholestasis and GHTN - in G1; baseline 24hr protein 179m  Only one-time elevated BP of 148/80 this pregnancy about 2 weeks ago, repeat labs WNL and weekly BPP 8/8 OB History     Gravida  3   Para  2   Term  2   Preterm  0   AB  0   Living  1      SAB  0   IAB  0   Ectopic  0   Multiple  0   Live Births  2          Past Medical History:  Diagnosis Date   Anxiety    Cholestasis    Cholestasis during pregnancy    Depression    Heart disease    Hepatic steatosis    History of cholestasis during pregnancy 12/12/2017   Delivered April 2019, fetal demise   History of cold sores    History of fetal demise, not currently pregnant 12/12/2017   History of peripheral edema 04/25/2014   History of pre-eclampsia    Hypertension    NASH (nonalcoholic steatohepatitis)    Obesity    S/P cesarean section 01/10/2019   Status post vacuum-assisted vaginal delivery 0April 24, 2019  fetal death   Vitamin D deficiency    Past Surgical History:  Procedure Laterality Date   APPENDECTOMY     CESAREAN SECTION N/A 01/10/2019   Procedure: CESAREAN SECTION;  Surgeon: BJanyth Contes MD;  Location: [facility];  Service: Obstetrics;  Laterality: N/A;  TRacyRNFA, Heather if she can   LAPAROSCOPIC APPENDECTOMY N/A 08/04/2017   Procedure: APPENDECTOMY LAPAROSCOPIC;  Surgeon: NAlphonsa Overall MD;  Location: [facility];  Service: General;  Laterality: N/A;   WISDOM TOOTH EXTRACTION     Family History: family history includes Cancer in her maternal aunt; Early death in her maternal grandmother; Heart disease in her maternal grandmother; Hyperlipidemia in her mother;  Hypertension in her father and mother; Mental illness in her father and paternal grandmother; Pancreatic cancer in her maternal grandfather; Prostate cancer in her maternal grandfather. Social History:  reports that she has never smoked. She has never used smokeless tobacco. She reports that she does not currently use alcohol after a past usage of about 2.0 standard drinks per week. She reports that she does not use drugs.     Maternal Diabetes: No1hr 127Genetic Screening: Normal Maternal Ultrasounds/Referrals: Normal Fetal Ultrasounds or other Referrals:  None Maternal Substance Abuse:  No Significant Maternal Medications:  None Significant Maternal Lab Results:  Group B Strep negative Other Comments:  None  Review of Systems  Constitutional:  Negative for chills and fever.  Respiratory:  Negative for shortness of breath.   Cardiovascular:  Negative for chest pain, palpitations and leg swelling.  Gastrointestinal:  Negative for abdominal pain and vomiting.  Neurological:  Negative for dizziness, weakness and headaches.  Psychiatric/Behavioral:  Negative for suicidal ideas.   Maternal Medical History:  Reason for admission: Rupture of membranes.   Contractions: Frequency: rare.   Fetal activity: Perceived fetal activity is normal.   Prenatal complications: PIH.   Prenatal Complications -  Diabetes: none.    Blood pressure (!) 150/90, pulse 75, temperature 98 F (36.7 C), temperature source Oral, resp. rate 20, height 5' 4"  (1.626 m), weight (!) 176.1 kg, last menstrual period 05/09/2020. Exam Physical Exam Constitutional:      General: She is not in acute distress.    Appearance: She is well-developed.     Comments: Super morbid obesity  HENT:     Head: Normocephalic and atraumatic.  Eyes:     Pupils: Pupils are equal, round, and reactive to light.  Cardiovascular:     Rate and Rhythm: Normal rate and regular rhythm.     Heart sounds: No murmur heard.   No gallop.   Abdominal:     Tenderness: There is no abdominal tenderness. There is no guarding or rebound.  Genitourinary:    Vagina: Normal.  Musculoskeletal:        General: Normal range of motion.     Cervical back: Normal range of motion and neck supple.  Skin:    General: Skin is warm and dry.  Neurological:     Mental Status: She is alert and oriented to person, place, and time.    Prenatal labs: ABO, Rh: --/--/PENDING (09/02 2332) Antibody: PENDING (09/02 2332) Rubella: Immune (02/24 0000) RPR: Nonreactive (02/24 0000)  HBsAg: Negative (02/24 0000)  HIV: Non-reactive (02/24 0000)  GBS:   NEG  Assessment/Plan: This is a 34yo G3P2001 @ 38 2/7 by 9wk scan admitted for r//o ROM. Prior to evaluation, had elevated BP followed by recurrently SRBPs requiring IV labetalol to bring down. Meets criteria for PreE with SF. Begin MgSO4 for seizure ppx.  H/o PLTCS, desires ERLTCS, declining TOLAC. Last PO intake 1930, needs full 8hrs given habitus. Aim for 0330 section. OR aware of 3g Ancef, TRAXI and Provena dressing. OB OR staff and anesthesia made aware R/B/A of cesarean section discussed with patient. Alternative would be vaginal delivery which would mean shorter postpartum stay and decreased risk of bleeding. Risks of section include infection of the uterus, pelvic organs, or skin, inadvertent injury to internal organs, such as bowel or bladder. If there is major injury, extensive surgery may be required. If injury is minor, it may be treated with relative ease. Discussed possibility of excessive blood loss and transfusion. If bleeding cannot be controlled using medical or minor surgical methods, a cesarean hysterectomy may be performed which would mean no future fertility. Patient accepts the possibility of blood transfusion, if necessary. Patient understands and agrees to move forward with section.   Joes 02/19/2021, 11:57 PM

## 2021-02-19 NOTE — MAU Provider Note (Signed)
History     CSN: [id number]  Arrival date and time: 02/19/21 2141   Event Date/Time   First Provider Initiated Contact with Patient 02/19/21 2309      No chief complaint on file.  HPI Patricia Berry is a 35 y.o. G3P2001 at 58w1dwho presents to MAU with chief complaint of leaking of fluid. This is a new problem, onset at [place] Patient endorses ongoing leaking.   Patient endorses history of mild blood pressure elevation in office but states her readings in MAU are significantly higher. She denies headache, visual disturbances, RUQ/epigastric pain.   Lower extremity swelling Patient voices concern about bilateral swelling in her feet. She states this has been a concern since she was about 30 weeks pregnancy and is worsening over time.  Right forearm and wrist concerns This is a new problem. Patient describes the sensations in her right forearm and as "numb", "tingling" and "painful" interchangeably. She denies loss of functionality. She denies change in sensation. She denies history of carpal tunnel.  [facility] for [facility]. NPO since 1900 hours.  OB History     Gravida  3   Para  2   Term  2   Preterm  0   AB  0   Living  1      SAB  0   IAB  0   Ectopic  0   Multiple  0   Live Births  2           Past Medical History:  Diagnosis Date   Anxiety    Cholestasis    Cholestasis during pregnancy    Depression    Heart disease    Hepatic steatosis    History of cholestasis during pregnancy 12/12/2017   Delivered April 2019, fetal demise   History of cold sores    History of fetal demise, not currently pregnant 12/12/2017   History of peripheral edema 04/25/2014   History of pre-eclampsia    Hypertension    NASH (nonalcoholic steatohepatitis)    Obesity    S/P cesarean section 01/10/2019   Status post vacuum-assisted vaginal delivery 0Apr 25, 2019  fetal death   Vitamin D deficiency     Past  Surgical History:  Procedure Laterality Date   APPENDECTOMY     CESAREAN SECTION N/A 01/10/2019   Procedure: CESAREAN SECTION;  Surgeon: BJanyth Contes MD;  Location: [facility] LD ORS;  Service: Obstetrics;  Laterality: N/A;  TRacyRNFA, Heather if she can   LAPAROSCOPIC APPENDECTOMY N/A 08/04/2017   Procedure: APPENDECTOMY LAPAROSCOPIC;  Surgeon: NAlphonsa Overall MD;  Location: [facility] ORS;  Service: General;  Laterality: N/A;   WISDOM TOOTH EXTRACTION      Family History  Problem Relation Age of Onset   Hyperlipidemia Mother    Hypertension Mother    Mental illness Father    Hypertension Father    Cancer Maternal Aunt        small cell   Heart disease Maternal Grandmother        heart attack   Early death Maternal Grandmother    Prostate cancer Maternal Grandfather    Pancreatic cancer Maternal Grandfather    Mental illness Paternal Grandmother    Liver disease Neg Hx     Social History   Tobacco Use   Smoking status: Never   Smokeless tobacco: Never  Vaping Use   Vaping Use: Never used  Substance Use Topics   Alcohol use: Not Currently  Alcohol/week: 2.0 standard drinks    Types: 2 Glasses of wine per week   Drug use: No    Allergies: No Known Allergies  Medications Prior to Admission  Medication Sig Dispense Refill Last Dose   aspirin EC 81 MG tablet Take 81 mg by mouth daily. Swallow whole.   02/19/2021   Prenatal Vit-Fe Fum-FA-Omega (ONE-A-DAY WOMENS PRENATAL PO) Take by mouth.   02/19/2021   Vitamin D, Cholecalciferol, 10 MCG (400 UNIT) CAPS Take by mouth.   02/19/2021    Review of Systems  Constitutional:  Positive for fatigue.  Eyes:  Negative for visual disturbance.  Cardiovascular:  Positive for leg swelling.  Genitourinary:  Positive for vaginal discharge.  Neurological:  Negative for headaches.  All other systems reviewed and are negative. Physical Exam   Blood pressure (!) 162/104, pulse 81, temperature 98 F (36.7 C), temperature source Oral, resp. rate  20, height 5' 4"  (1.626 m), weight (!) 176.1 kg, last menstrual period 05/09/2020.  Physical Exam Vitals and nursing note reviewed. Exam conducted with a chaperone present.  Constitutional:      Appearance: Normal appearance. She is obese.  Cardiovascular:     Rate and Rhythm: Normal rate and regular rhythm.     Pulses: Normal pulses.     Heart sounds: Normal heart sounds.  Pulmonary:     Effort: Pulmonary effort is normal.     Breath sounds: Normal breath sounds.  Abdominal:     Comments: Gravid  Genitourinary:    Comments: Pelvic exam: External genitalia normal, vaginal walls pink and well rugated, cervix visually closed, no lesions noted. Negative pooling, negative fern.   Skin:    Capillary Refill: Capillary refill takes less than 2 seconds.  Neurological:     Mental Status: She is alert and oriented to person, place, and time.     Motor: Motor function is intact.     Coordination: Coordination is intact.     Comments: Hand grip and cap refill equal bilaterally  Psychiatric:        Mood and Affect: Mood normal.        Behavior: Behavior normal.        Thought Content: Thought content normal.        Judgment: Judgment normal.    MAU Course  Procedures: pelvic exam  --Reactive tracing: baseline 135, mod var, + accels, no decels --Forearm and wrist complaints consistent carpal tunnel, continue to monitor postpartum --Intact amniotic sac (negative pooling, negative fern)  Patient Vitals for the past 24 hrs:  BP Temp Temp src Pulse Resp SpO2 Height Weight  02/20/21 0510 108/65 -- -- 69 -- -- -- --  02/20/21 0501 (!) 92/49 -- -- 79 -- -- -- --  02/20/21 0500 -- -- -- -- 20 -- -- --  02/20/21 0449 (!) 98/48 -- -- 71 -- -- -- --  02/20/21 0426 (!) 93/54 -- -- 69 -- -- -- --  02/20/21 0423 (!) 85/65 -- -- 93 -- -- -- --  02/20/21 0410 -- -- -- -- -- 99 % -- --  02/20/21 0405 -- -- -- -- -- 92 % -- --  02/20/21 0404 (!) 167/108 -- -- 71 -- -- -- --  02/20/21 0401 (!)  166/79 -- -- 69 -- -- -- --  02/20/21 0400 -- -- -- -- 20 -- -- --  02/20/21 0330 -- -- -- -- -- 94 % -- --  02/20/21 0300 (!) 149/93 -- -- 76 20 93 % -- --  02/20/21 0200 140/88 -- -- 78 20 97 % -- --  02/20/21 0140 128/79 -- -- 80 -- 96 % -- --  02/20/21 0130 -- -- -- -- 20 95 % -- --  02/20/21 0120 -- -- -- -- -- 95 % -- --  02/20/21 0115 (!) 167/96 -- -- 78 20 96 % -- --  02/20/21 0110 -- -- -- -- -- 97 % -- --  02/20/21 0100 (!) 158/90 -- -- 78 20 97 % -- --  02/20/21 0056 (!) 159/96 -- -- 79 -- -- -- --  02/20/21 0055 -- -- -- -- -- 98 % -- --  02/20/21 0053 -- -- -- -- -- 96 % -- --  02/20/21 0046 (!) 157/87 -- -- 80 -- -- -- --  02/20/21 0043 (!) 158/93 -- -- 79 -- -- -- --  02/20/21 0040 -- -- -- -- -- 98 % -- --  02/20/21 0036 -- -- -- -- -- 99 % -- --  02/20/21 0031 (!) 157/97 -- -- 80 -- -- -- --  02/20/21 0030 -- -- -- -- -- 98 % -- --  02/20/21 0016 (!) 157/93 -- -- 79 -- -- -- --  02/20/21 0001 (!) 164/119 -- -- 82 -- -- -- --  02/19/21 2346 (!) 150/90 -- -- 75 -- -- -- --  02/19/21 2339 (!) 158/101 -- -- 74 -- -- -- --  02/19/21 2302 (!) 162/104 -- -- 81 -- -- -- --  02/19/21 2213 (!) 147/98 98 F (36.7 C) Oral 76 20 -- 5' 4"  (1.626 m) (!) 176.1 kg   Orders Placed This Encounter  Procedures   Resp Panel by RT-PCR (Flu A&B, Covid) Nasopharyngeal Swab   CBC   Comprehensive metabolic panel   RPR   Protein / creatinine ratio, urine   Notify physician (specify) Confirmatory reading of BP> 160/110 15 minutes later   Notify physician (specify) Confirmatory reading of BP> 160/110 15 minutes later   Vital signs   Fetal monitoring   Measure blood pressure   Type and screen [place]   Blood draw with IV start   Meds ordered this encounter  Medications   lactated ringers infusion   magnesium bolus via infusion 4 g   magnesium sulfate 40 grams in SWI 1000 mL OB infusion   AND Linked Order Group    labetalol (NORMODYNE) injection 20 mg     labetalol (NORMODYNE) injection 40 mg    labetalol (NORMODYNE) injection 80 mg    hydrALAZINE (APRESOLINE) injection 10 mg   Assessment and Plan  --35 y.o. G3P2001 at [redacted]w[redacted]d --Reactive tracing --Severe Preeclampsia, Magnesium Sulfate infusing --For repeat cesarean with Dr. SRoxy Horseman CNM 02/20/2021, 5:54 AM

## 2021-02-19 NOTE — MAU Note (Addendum)
PT SAYS AT 8 PM- WENT TO B-ROOM- AFTER VOIDING  AFTERWARDS FLUID CAME OUT-  NOT WET NOW LAST SEX- TUESDAY PNC WITH DR Marvel Plan YESTERDAY- NO VE REPEAT C/S SCHEDULED FOR Thursday  GBS- NEG  RIGHT ARM - ELBOW DOWN - ESP WRIST -NUMBNESS STARTED TONIGHT AT 830PM. NO PAIN

## 2021-02-19 NOTE — Patient Instructions (Signed)
Patricia Berry  02/19/2021   Your procedure is scheduled on:  02/25/2021  Arrive at 31 at Entrance C on [address] at [facility]  and [employer] [employer]. You are invited to use the FREE valet parking or use the Visitor's parking deck.  Pick up the phone at the desk and dial [phone].  Call this number if you have problems the morning of surgery: [phone]  Remember:   Do not eat food:(After Midnight) Desps de medianoche.  Do not drink clear liquids: (After Midnight) Desps de medianoche.  Take these medicines the morning of surgery with A SIP OF WATER:  none   Do not wear jewelry, make-up or nail polish.  Do not wear lotions, powders, or perfumes. Do not wear deodorant.  Do not shave 48 hours prior to surgery.  Do not bring valuables to the hospital.  [facility] is not   responsible for any belongings or valuables brought to the hospital.  Contacts, dentures or bridgework may not be worn into surgery.  Leave suitcase in the car. After surgery it may be brought to your room.  For patients admitted to the hospital, checkout time is 11:00 AM the day of              discharge.      Please read over the following fact sheets that you were given:     Preparing for Surgery

## 2021-02-20 ENCOUNTER — Inpatient Hospital Stay (HOSPITAL_COMMUNITY): Payer: BC Managed Care – PPO | Admitting: Anesthesiology

## 2021-02-20 ENCOUNTER — Encounter (HOSPITAL_COMMUNITY): Payer: Self-pay | Admitting: Obstetrics and Gynecology

## 2021-02-20 ENCOUNTER — Encounter (HOSPITAL_COMMUNITY)
Admission: AD | Disposition: A | Discharge status: Home / Self Care | Payer: Self-pay | Source: Home / Self Care | Attending: Obstetrics and Gynecology

## 2021-02-20 ENCOUNTER — Inpatient Hospital Stay (HOSPITAL_COMMUNITY)
Admission: RE | Admit: 2021-02-20 | Payer: BC Managed Care – PPO | Source: Ambulatory Visit | Admitting: Obstetrics and Gynecology

## 2021-02-20 DIAGNOSIS — O34211 Maternal care for low transverse scar from previous cesarean delivery: Secondary | ICD-10-CM | POA: Diagnosis present

## 2021-02-20 DIAGNOSIS — Z98891 History of uterine scar from previous surgery: Secondary | ICD-10-CM

## 2021-02-20 DIAGNOSIS — O99214 Obesity complicating childbirth: Secondary | ICD-10-CM | POA: Diagnosis present

## 2021-02-20 DIAGNOSIS — O134 Gestational [pregnancy-induced] hypertension without significant proteinuria, complicating childbirth: Secondary | ICD-10-CM | POA: Diagnosis present

## 2021-02-20 DIAGNOSIS — Z3A38 38 weeks gestation of pregnancy: Secondary | ICD-10-CM | POA: Diagnosis not present

## 2021-02-20 LAB — CBC
HCT: 33.7 % — ABNORMAL LOW (ref 36.0–46.0)
HCT: 36.8 % (ref 36.0–46.0)
Hemoglobin: 10.9 g/dL — ABNORMAL LOW (ref 12.0–15.0)
Hemoglobin: 12 g/dL (ref 12.0–15.0)
MCH: 27 pg (ref 26.0–34.0)
MCH: 27.2 pg (ref 26.0–34.0)
MCHC: 32.3 g/dL (ref 30.0–36.0)
MCHC: 32.6 g/dL (ref 30.0–36.0)
MCV: 83.4 fL (ref 80.0–100.0)
MCV: 83.4 fL (ref 80.0–100.0)
Platelets: 287 10*3/uL (ref 150–400)
Platelets: 333 10*3/uL (ref 150–400)
RBC: 4.04 MIL/uL (ref 3.87–5.11)
RBC: 4.41 MIL/uL (ref 3.87–5.11)
RDW: 15.2 % (ref 11.5–15.5)
RDW: 15.6 % — ABNORMAL HIGH (ref 11.5–15.5)
WBC: 11 10*3/uL — ABNORMAL HIGH (ref 4.0–10.5)
WBC: 13.1 10*3/uL — ABNORMAL HIGH (ref 4.0–10.5)
nRBC: 0 % (ref 0.0–0.2)
nRBC: 0 % (ref 0.0–0.2)

## 2021-02-20 LAB — COMPREHENSIVE METABOLIC PANEL
ALT: 16 U/L (ref 0–44)
AST: 33 U/L (ref 15–41)
Albumin: 2.4 g/dL — ABNORMAL LOW (ref 3.5–5.0)
Alkaline Phosphatase: 81 U/L (ref 38–126)
Anion gap: 7 (ref 5–15)
BUN: 12 mg/dL (ref 6–20)
CO2: 23 mmol/L (ref 22–32)
Calcium: 8.9 mg/dL (ref 8.9–10.3)
Chloride: 106 mmol/L (ref 98–111)
Creatinine, Ser: 0.78 mg/dL (ref 0.44–1.00)
GFR, Estimated: >60 mL/min (ref >60)
Glucose, Bld: 87 mg/dL (ref 70–99)
Potassium: 4.8 mmol/L (ref 3.5–5.1)
Sodium: 136 mmol/L (ref 135–145)
Total Bilirubin: 0.7 mg/dL (ref 0.3–1.2)
Total Protein: 5.5 g/dL — ABNORMAL LOW (ref 6.5–8.1)

## 2021-02-20 LAB — PROTEIN / CREATININE RATIO, URINE
Creatinine, Urine: 178.1 mg/dL
Protein Creatinine Ratio: 0.13 mg/mg{Cre} (ref 0.00–0.15)
Total Protein, Urine: 24 mg/dL

## 2021-02-20 LAB — CREATININE, SERUM
Creatinine, Ser: 0.68 mg/dL (ref 0.44–1.00)
GFR, Estimated: >60 mL/min (ref >60)

## 2021-02-20 LAB — RPR: RPR Ser Ql: NONREACTIVE

## 2021-02-20 LAB — TYPE AND SCREEN
ABO/RH(D): B POS
Antibody Screen: NEGATIVE

## 2021-02-20 SURGERY — Surgical Case
Anesthesia: Spinal | Wound class: Clean Contaminated

## 2021-02-20 MED ORDER — SODIUM CHLORIDE 0.9% FLUSH: 3.0000 mL | INTRAVENOUS | Status: DC | PRN

## 2021-02-20 MED ORDER — SIMETHICONE 80 MG PO CHEW: 80.0000 mg | CHEWABLE_TABLET | ORAL | Status: DC | PRN

## 2021-02-20 MED ORDER — WITCH HAZEL-GLYCERIN EX PADS: 1.0000 "application " | MEDICATED_PAD | CUTANEOUS | Status: DC | PRN

## 2021-02-20 MED ORDER — PRENATAL MULTIVITAMIN CH
1.0000 | ORAL_TABLET | Freq: Every day | ORAL | Status: DC
  Administered 2021-02-20 – 2021-02-22 (×3): 1 via ORAL
  Filled 2021-02-20 (×3): qty 1

## 2021-02-20 MED ORDER — OXYCODONE HCL 5 MG PO TABS
5.0000 mg | ORAL_TABLET | Freq: Once | ORAL | Status: DC | PRN
Start: 2021-02-20 — End: 2021-02-20

## 2021-02-20 MED ORDER — DIPHENHYDRAMINE HCL 50 MG/ML IJ SOLN: 12.5000 mg | INTRAMUSCULAR | Status: DC | PRN

## 2021-02-20 MED ORDER — MENTHOL 3 MG MT LOZG: 1.0000 | LOZENGE | OROMUCOSAL | Status: DC | PRN

## 2021-02-20 MED ORDER — LACTATED RINGERS IV SOLN
INTRAVENOUS | Status: DC
  Administered 2021-02-20: 30 mL via INTRAVENOUS

## 2021-02-20 MED ORDER — ACETAMINOPHEN 500 MG PO TABS: 1000.0000 mg | ORAL_TABLET | Freq: Four times a day (QID) | ORAL | Status: DC

## 2021-02-20 MED ORDER — OXYCODONE HCL 5 MG PO TABS
5.0000 mg | ORAL_TABLET | ORAL | Status: DC | PRN
  Administered 2021-02-22 (×2): 5 mg via ORAL
  Administered 2021-02-22: 10 mg via ORAL
  Filled 2021-02-20: qty 2
  Filled 2021-02-20 (×2): qty 1

## 2021-02-20 MED ORDER — ENOXAPARIN SODIUM 100 MG/ML IJ SOSY
90.0000 mg | PREFILLED_SYRINGE | INTRAMUSCULAR | Status: DC
  Administered 2021-02-21: 90 mg via SUBCUTANEOUS
  Filled 2021-02-20 (×2): qty 0.9

## 2021-02-20 MED ORDER — EPHEDRINE 5 MG/ML INJ
INTRAVENOUS | Status: AC
  Filled 2021-02-20: qty 5

## 2021-02-20 MED ORDER — DROPERIDOL 2.5 MG/ML IJ SOLN: 0.6250 mg | Freq: Once | INTRAMUSCULAR | Status: DC | PRN

## 2021-02-20 MED ORDER — NALBUPHINE HCL 10 MG/ML IJ SOLN: 5.0000 mg | INTRAMUSCULAR | Status: DC | PRN

## 2021-02-20 MED ORDER — KETOROLAC TROMETHAMINE 30 MG/ML IJ SOLN
30.0000 mg | Freq: Four times a day (QID) | INTRAMUSCULAR | Status: AC
  Administered 2021-02-20 – 2021-02-21 (×4): 30 mg via INTRAVENOUS
  Filled 2021-02-20 (×4): qty 1

## 2021-02-20 MED ORDER — TETANUS-DIPHTH-ACELL PERTUSSIS 5-2.5-18.5 LF-MCG/0.5 IM SUSY: 0.5000 mL | PREFILLED_SYRINGE | Freq: Once | INTRAMUSCULAR | Status: DC

## 2021-02-20 MED ORDER — FENTANYL CITRATE (PF) 100 MCG/2ML IJ SOLN: 25.0000 ug | INTRAMUSCULAR | Status: DC | PRN

## 2021-02-20 MED ORDER — ONDANSETRON HCL 4 MG/2ML IJ SOLN: 4.0000 mg | Freq: Three times a day (TID) | INTRAMUSCULAR | Status: DC | PRN

## 2021-02-20 MED ORDER — DIPHENHYDRAMINE HCL 25 MG PO CAPS: 25.0000 mg | ORAL_CAPSULE | Freq: Four times a day (QID) | ORAL | Status: DC | PRN

## 2021-02-20 MED ORDER — BUPIVACAINE IN DEXTROSE 0.75-8.25 % IT SOLN
INTRATHECAL | Status: DC | PRN
  Administered 2021-02-20: 1.6 mL via INTRATHECAL

## 2021-02-20 MED ORDER — SOD CITRATE-CITRIC ACID 500-334 MG/5ML PO SOLN
30.0000 mL | Freq: Once | ORAL | Status: AC
  Administered 2021-02-20: 30 mL via ORAL
  Filled 2021-02-20: qty 30

## 2021-02-20 MED ORDER — FENTANYL CITRATE (PF) 100 MCG/2ML IJ SOLN
INTRAMUSCULAR | Status: AC
  Filled 2021-02-20: qty 2

## 2021-02-20 MED ORDER — SODIUM CHLORIDE 0.9 % IV SOLN
INTRAVENOUS | Status: DC | PRN
  Administered 2021-02-20: 40 ug/min via INTRAVENOUS

## 2021-02-20 MED ORDER — FENTANYL CITRATE (PF) 100 MCG/2ML IJ SOLN
INTRAMUSCULAR | Status: DC | PRN
  Administered 2021-02-20: 15 ug via INTRATHECAL

## 2021-02-20 MED ORDER — OXYTOCIN-SODIUM CHLORIDE 30-0.9 UT/500ML-% IV SOLN
INTRAVENOUS | Status: AC
  Filled 2021-02-20: qty 500

## 2021-02-20 MED ORDER — OXYCODONE HCL 5 MG/5ML PO SOLN: 5.0000 mg | Freq: Once | ORAL | Status: DC | PRN

## 2021-02-20 MED ORDER — OXYTOCIN-SODIUM CHLORIDE 30-0.9 UT/500ML-% IV SOLN
INTRAVENOUS | Status: DC | PRN
  Administered 2021-02-20: 300 mL via INTRAVENOUS

## 2021-02-20 MED ORDER — KETOROLAC TROMETHAMINE 30 MG/ML IJ SOLN
30.0000 mg | Freq: Four times a day (QID) | INTRAMUSCULAR | Status: DC | PRN
Start: 2021-02-20 — End: 2021-02-21

## 2021-02-20 MED ORDER — ZOLPIDEM TARTRATE 5 MG PO TABS: 5.0000 mg | ORAL_TABLET | Freq: Every evening | ORAL | Status: DC | PRN

## 2021-02-20 MED ORDER — SENNOSIDES-DOCUSATE SODIUM 8.6-50 MG PO TABS
2.0000 | ORAL_TABLET | Freq: Every day | ORAL | Status: DC
  Administered 2021-02-21 – 2021-02-22 (×2): 2 via ORAL
  Filled 2021-02-20 (×2): qty 2

## 2021-02-20 MED ORDER — ACETAMINOPHEN 500 MG PO TABS
1000.0000 mg | ORAL_TABLET | Freq: Four times a day (QID) | ORAL | Status: DC
  Administered 2021-02-20 – 2021-02-21 (×3): 1000 mg via ORAL
  Filled 2021-02-20 (×3): qty 2

## 2021-02-20 MED ORDER — PROMETHAZINE HCL 25 MG/ML IJ SOLN: 6.2500 mg | INTRAMUSCULAR | Status: DC | PRN

## 2021-02-20 MED ORDER — DIPHENHYDRAMINE HCL 25 MG PO CAPS: 25.0000 mg | ORAL_CAPSULE | ORAL | Status: DC | PRN

## 2021-02-20 MED ORDER — COCONUT OIL OIL
1.0000 "application " | TOPICAL_OIL | Status: DC | PRN
  Administered 2021-02-22: 1 via TOPICAL

## 2021-02-20 MED ORDER — POVIDONE-IODINE 10 % EX SWAB: 2.0000 "application " | Freq: Once | CUTANEOUS | Status: DC

## 2021-02-20 MED ORDER — PHENYLEPHRINE HCL-NACL 20-0.9 MG/250ML-% IV SOLN
INTRAVENOUS | Status: AC
  Filled 2021-02-20: qty 250

## 2021-02-20 MED ORDER — ONDANSETRON HCL 4 MG/2ML IJ SOLN
INTRAMUSCULAR | Status: DC | PRN
  Administered 2021-02-20: 4 mg via INTRAVENOUS

## 2021-02-20 MED ORDER — ONDANSETRON HCL 4 MG/2ML IJ SOLN
INTRAMUSCULAR | Status: AC
  Filled 2021-02-20: qty 2

## 2021-02-20 MED ORDER — KETOROLAC TROMETHAMINE 30 MG/ML IJ SOLN: 30.0000 mg | Freq: Four times a day (QID) | INTRAMUSCULAR | Status: DC | PRN

## 2021-02-20 MED ORDER — SCOPOLAMINE 1 MG/3DAYS TD PT72
1.0000 | MEDICATED_PATCH | Freq: Once | TRANSDERMAL | Status: DC
  Administered 2021-02-20: 1.5 mg via TRANSDERMAL
  Filled 2021-02-20: qty 1

## 2021-02-20 MED ORDER — EPHEDRINE SULFATE 50 MG/ML IJ SOLN
INTRAMUSCULAR | Status: DC | PRN
  Administered 2021-02-20: 5 mg via INTRAVENOUS

## 2021-02-20 MED ORDER — SIMETHICONE 80 MG PO CHEW
80.0000 mg | CHEWABLE_TABLET | Freq: Three times a day (TID) | ORAL | Status: DC
  Administered 2021-02-20 – 2021-02-22 (×7): 80 mg via ORAL
  Filled 2021-02-20 (×7): qty 1

## 2021-02-20 MED ORDER — NALOXONE HCL 4 MG/10ML IJ SOLN
1.0000 ug/kg/h | INTRAVENOUS | Status: DC | PRN
  Filled 2021-02-20: qty 5

## 2021-02-20 MED ORDER — NALOXONE HCL 0.4 MG/ML IJ SOLN: 0.4000 mg | INTRAMUSCULAR | Status: DC | PRN

## 2021-02-20 MED ORDER — DIBUCAINE (PERIANAL) 1 % EX OINT: 1.0000 "application " | TOPICAL_OINTMENT | CUTANEOUS | Status: DC | PRN

## 2021-02-20 MED ORDER — MEPERIDINE HCL 25 MG/ML IJ SOLN: 6.2500 mg | INTRAMUSCULAR | Status: DC | PRN

## 2021-02-20 MED ORDER — OXYTOCIN-SODIUM CHLORIDE 30-0.9 UT/500ML-% IV SOLN
2.5000 [IU]/h | INTRAVENOUS | Status: DC
  Administered 2021-02-20: 2.5 [IU]/h via INTRAVENOUS

## 2021-02-20 MED ORDER — PHENYLEPHRINE 40 MCG/ML (10ML) SYRINGE FOR IV PUSH (FOR BLOOD PRESSURE SUPPORT)
PREFILLED_SYRINGE | INTRAVENOUS | Status: AC
  Filled 2021-02-20: qty 10

## 2021-02-20 MED ORDER — NALBUPHINE HCL 10 MG/ML IJ SOLN: 5.0000 mg | Freq: Once | INTRAMUSCULAR | Status: DC | PRN

## 2021-02-20 MED ORDER — IBUPROFEN 600 MG PO TABS
600.0000 mg | ORAL_TABLET | Freq: Four times a day (QID) | ORAL | Status: DC
  Administered 2021-02-21 – 2021-02-22 (×4): 600 mg via ORAL
  Filled 2021-02-20 (×4): qty 1

## 2021-02-20 MED ORDER — SODIUM CHLORIDE 0.9 % IR SOLN
Status: DC | PRN
  Administered 2021-02-20: 1000 mL
  Administered 2021-02-20: 1

## 2021-02-20 MED ORDER — NALBUPHINE HCL 10 MG/ML IJ SOLN
5.0000 mg | Freq: Once | INTRAMUSCULAR | Status: DC | PRN
Start: 2021-02-20 — End: 2021-02-22

## 2021-02-20 MED ORDER — MORPHINE SULFATE (PF) 0.5 MG/ML IJ SOLN
INTRAMUSCULAR | Status: DC | PRN
  Administered 2021-02-20: 150 ug via EPIDURAL

## 2021-02-20 MED ORDER — MORPHINE SULFATE (PF) 0.5 MG/ML IJ SOLN
INTRAMUSCULAR | Status: AC
  Filled 2021-02-20: qty 10

## 2021-02-20 MED ORDER — LACTATED RINGERS IV SOLN: INTRAVENOUS | Status: DC

## 2021-02-20 MED ORDER — DEXTROSE 5 % IV SOLN
INTRAVENOUS | Status: DC | PRN
  Administered 2021-02-20: 3 g via INTRAVENOUS

## 2021-02-20 SURGICAL SUPPLY — 46 items
CANISTER WOUND CARE 500ML ATS (WOUND CARE) ×4 IMPLANT
CHLORAPREP W/TINT 26ML (MISCELLANEOUS) ×2 IMPLANT
CLAMP CORD UMBIL (MISCELLANEOUS) IMPLANT
CLOTH BEACON ORANGE TIMEOUT ST (SAFETY) ×2 IMPLANT
DERMABOND ADVANCED (GAUZE/BANDAGES/DRESSINGS) ×1
DERMABOND ADVANCED .7 DNX12 (GAUZE/BANDAGES/DRESSINGS) ×1 IMPLANT
DRESSING PREVENA PLUS CUSTOM (GAUZE/BANDAGES/DRESSINGS) ×1 IMPLANT
DRSG OPSITE POSTOP 4X10 (GAUZE/BANDAGES/DRESSINGS) ×2 IMPLANT
DRSG PREVENA PLUS CUSTOM (GAUZE/BANDAGES/DRESSINGS) ×2
ELECT REM PT RETURN 9FT ADLT (ELECTROSURGICAL) ×2
ELECTRODE REM PT RTRN 9FT ADLT (ELECTROSURGICAL) ×1 IMPLANT
EXTENDER TRAXI PANNICULUS (MISCELLANEOUS) ×1 IMPLANT
EXTRACTOR VACUUM BELL STYLE (SUCTIONS) IMPLANT
GAUZE SPONGE 4X4 12PLY STRL LF (GAUZE/BANDAGES/DRESSINGS) IMPLANT
GLOVE BIO SURGEON STRL SZ 6 (GLOVE) ×2 IMPLANT
GLOVE BIOGEL PI IND STRL 6.5 (GLOVE) ×1 IMPLANT
GLOVE BIOGEL PI IND STRL 7.0 (GLOVE) ×2 IMPLANT
GLOVE BIOGEL PI INDICATOR 6.5 (GLOVE) ×1
GLOVE BIOGEL PI INDICATOR 7.0 (GLOVE) ×2
GOWN STRL REUS W/TWL LRG LVL3 (GOWN DISPOSABLE) ×4 IMPLANT
KIT ABG SYR 3ML LUER SLIP (SYRINGE) IMPLANT
MAT PREVALON FULL STRYKER (MISCELLANEOUS) ×2 IMPLANT
NEEDLE HYPO 25X5/8 SAFETYGLIDE (NEEDLE) IMPLANT
NS IRRIG 1000ML POUR BTL (IV SOLUTION) ×2 IMPLANT
PACK C SECTION WH (CUSTOM PROCEDURE TRAY) ×2 IMPLANT
PAD ABD 8X10 STRL (GAUZE/BANDAGES/DRESSINGS) IMPLANT
PAD OB MATERNITY 4.3X12.25 (PERSONAL CARE ITEMS) ×2 IMPLANT
PENCIL SMOKE EVAC W/HOLSTER (ELECTROSURGICAL) ×2 IMPLANT
RETAINER VISCERAL (MISCELLANEOUS) ×2 IMPLANT
RETRACTOR TRAXI PANNICULUS (MISCELLANEOUS) ×2 IMPLANT
RTRCTR C-SECT PINK 25CM LRG (MISCELLANEOUS) IMPLANT
SUT PDS AB 0 CTX 36 PDP370T (SUTURE) IMPLANT
SUT PLAIN 0 NONE (SUTURE) ×2 IMPLANT
SUT PLAIN 2 0 (SUTURE) ×1
SUT PLAIN ABS 2-0 CT1 27XMFL (SUTURE) ×1 IMPLANT
SUT VIC AB 0 CT1 36 (SUTURE) ×4 IMPLANT
SUT VIC AB 2-0 CT1 27 (SUTURE) ×1
SUT VIC AB 2-0 CT1 TAPERPNT 27 (SUTURE) ×1 IMPLANT
SUT VIC AB 3-0 SH 27 (SUTURE)
SUT VIC AB 3-0 SH 27X BRD (SUTURE) IMPLANT
SUT VIC AB 4-0 KS 27 (SUTURE) ×2 IMPLANT
TOWEL OR 17X24 6PK STRL BLUE (TOWEL DISPOSABLE) ×2 IMPLANT
TRAXI PANNICULUS EXTENDER (MISCELLANEOUS) ×1
TRAXI PANNICULUS RETRACTOR (MISCELLANEOUS) ×2
TRAY FOLEY W/BAG SLVR 14FR LF (SET/KITS/TRAYS/PACK) ×2 IMPLANT
WATER STERILE IRR 1000ML POUR (IV SOLUTION) ×2 IMPLANT

## 2021-02-20 NOTE — Op Note (Signed)
C-Section Operative Note  Date: 02/20/21  Preoperative Diagnosis: IUP @ 38 2/7, PreE w/ SF, super-morbid obesity with BMI 67 Postoperative Diagnosis: Same as above Indication: H/o csx x1 Findings: Viable female infant  APGARS of 9 and 9 at 1 and 5 minutes, respectively. Normal appearing uterus, bilateral fallopian tubes and ovaries. Specimens: Placenta to pathology EBL 350 IVF 1300 UOP 100  Patient Course: Patient presented for evaluation of r/o ROM. In process, patient proceeded to have recurrent severe range Bps meeting PreE definitions, required multiple IV Labetalol to bring down. Started on MgSO4 for seizure ppx. Consented for [facility]  Consent:  R/B/A of cesarean section discussed with patient. Alternative would be vaginal delivery which would mean shorter postpartum stay and decreased risk of bleeding. Risks of section include infection of the uterus, pelvic organs, or skin, inadvertent injury to internal organs, such as bowel or bladder. If there is major injury, extensive surgery may be required. If injury is minor, it may be treated with relative ease. Discussed possibility of excessive blood loss and transfusion. If bleeding cannot be controlled using medical or minor surgical methods, a cesarean hysterectomy may be performed which would mean no future fertility. Patient accepts the possibility of blood transfusion, if necessary. Patient understands and agrees to move forward with section.   Operative Procedure: Patient was taken to the operating room where epidural anesthesia was found to be adequate by Allis clamp test. She was prepped and draped in the normal sterile fashion in the dorsal supine position with a leftward tilt. TRAXI with Extender placed, 3g Ancef for prophylaxis. An appropriate time out was performed. A Pfannenstiel skin incision was then made with the scalpel and carried through to the underlying layer of fascia by sharp dissection and Bovie cautery. The fascia was  nicked in the midline and the incision was extended laterally with Mayo scissors The superior aspect of the incision was grasped Coker clamps and dissected off the underlying rectus muscles. In a similar fashion the inferior aspect was dissected off the rectus muscles. Rectus muscles were separated in the midline and the peritoneal cavity entered with hemostats and Metzenbaum scissors. The peritoneal incision was then extended both superiorly and inferiorly with careful attention to avoid both bowel and bladder. The Alexis self-retaining wound retractor was then placed within the incision and the lower uterine segment exposed. The bladder flap was developed with Metzenbaum scissors and pushed away from the lower uterine segment. The lower uterine segment was then incised in a low transverse fashion and the cavity itself entered bluntly. The incision was extended bluntly. Amniotic sac was ruptured and fluid was noted to be clear in color. The infant's head was then lifted and delivered from the incision without difficulty using the standard movements. Nuchal cord x1, easily reduced. The remainder of the infant delivered and the nose and mouth bulb suctioned with the cord clamped and cut as well. The infant was handed off to NICU. The placenta was then spontaneously expressed from the uterus and the uterus cleared of all clots and debris with moist lap sponge. The uterine incision was then repaired in 2 layers the first layer was a running locked layer of 0-vicryl and the second an imbricating layer of the same suture. The tubes and ovaries were inspected and the gutters cleared of all clots and debris. The uterine incision was inspected and found to be hemostatic. All instruments and sponges as well as the Alexis retractor were then removed from the abdomen. The rectus muscles were  then reapproximated with several interrupted mattress sutures of 0-0 Vicryl. The fascia was then closed with 0-PDS beginning at both  apices and tying at midline. Subcutaneous tissue was reapproximated with 3-0 plain in a running fashion. The skin was closed with a subcuticular stitch of 4-0 Vicryl on a Keith needle and then reinforced with Provena vacuum dressing. At the conclusion of the procedure all instruments and sponge counts were correct. Patient was taken to the recovery room in good condition with her baby accompanying her skin to skin.

## 2021-02-20 NOTE — Anesthesia Preprocedure Evaluation (Addendum)
Anesthesia Evaluation  Patient identified by MRN, date of birth, ID band Patient awake    Reviewed: Allergy & Precautions, NPO status , Patient's Chart, lab work & pertinent test results  Airway Mallampati: III   Neck ROM: Full    Dental no notable dental hx.    Pulmonary    Pulmonary exam normal        Cardiovascular hypertension (preeclampsia on Mag), Pt. on medications Normal cardiovascular exam     Neuro/Psych PSYCHIATRIC DISORDERS Anxiety Depression negative neurological ROS     GI/Hepatic negative GI ROS, (+) Hepatitis - (NASH)  Endo/Other  Morbid obesity  Renal/GU negative Renal ROS     Musculoskeletal negative musculoskeletal ROS (+)   Abdominal (+) + obese,   Peds  Hematology negative hematology ROS (+)   Anesthesia Other Findings   Reproductive/Obstetrics (+) Pregnancy                            Anesthesia Physical  Anesthesia Plan  ASA: 4  Anesthesia Plan: Spinal   Post-op Pain Management:    Induction:   PONV Risk Score and Plan: 4 or greater and Ondansetron, Dexamethasone, Treatment may vary due to age or medical condition, Scopolamine patch - Pre-op and Midazolam  Airway Management Planned: Natural Airway  Additional Equipment:   Intra-op Plan:   Post-operative Plan:   Informed Consent: I have reviewed the patients History and Physical, chart, labs and discussed the procedure including the risks, benefits and alternatives for the proposed anesthesia with the patient or authorized representative who has indicated his/her understanding and acceptance.     Dental advisory given  Plan Discussed with: CRNA and Anesthesiologist  Anesthesia Plan Comments: (Spinal. GETA as backup plan. BMI 67. Patient ate at [place]. Plan for 0330 C section. Glidescope available. Norton Blizzard, MD  )        Anesthesia Quick Evaluation

## 2021-02-20 NOTE — Anesthesia Procedure Notes (Signed)
Spinal  Patient location during procedure: OR Start time: 02/20/2021 5:30 AM End time: 02/20/2021 5:38 AM Reason for block: surgical anesthesia Staffing Performed: anesthesiologist  Anesthesiologist: Merlinda Frederick, MD Preanesthetic Checklist Completed: patient identified, IV checked, risks and benefits discussed, surgical consent, monitors and equipment checked, pre-op evaluation and timeout performed Spinal Block Patient position: sitting Prep: DuraPrep Patient monitoring: cardiac monitor, continuous pulse ox and blood pressure Approach: midline Location: L3-4 Injection technique: single-shot Needle Needle type: Pencan  Needle gauge: 24 G Needle length: 9 cm Assessment Events: CSF return Additional Notes Functioning IV was confirmed and monitors were applied. Sterile prep and drape, including hand hygiene and sterile gloves were used. The patient was positioned and the spine was prepped. The skin was anesthetized with lidocaine.  Free flow of clear CSF was obtained prior to injecting local anesthetic into the CSF.  The spinal needle aspirated freely following injection.  The needle was carefully withdrawn.  The patient tolerated the procedure well. Had to depress skin several centimeters while advancing spinal needle in order to reach spinal space with the 9cm needle. Norton Blizzard, MD

## 2021-02-20 NOTE — Progress Notes (Signed)
Subjective: Postpartum Day 0: Cesarean Delivery Patient reports tolerating PO with no nausea/vomiting. Pain well controlled with meds. Denies HA or blurry vision. Has no complaints at this time  Bonding well with baby.   Objective: Vital signs in last 24 hours: Temp:  [97.5 F (36.4 C)-98 F (36.7 C)] 97.5 F (36.4 C) (09/03 1006) Pulse Rate:  [59-93] 63 (09/03 1006) Resp:  [10-27] 18 (09/03 1006) BP: (85-167)/(48-119) 121/69 (09/03 1006) SpO2:  [87 %-99 %] 97 % (09/03 1006) Weight:  [176.1 kg] 176.1 kg (09/02 2213)  Physical Exam:  General: cooperative and no distress; sleeping but easily arousable  Lochia: appropriate Uterine Fundus: firm Incision: no significant drainage DVT Evaluation: No evidence of DVT seen on physical exam. Scds in place  Recent Labs    02/19/21 2339 02/20/21 0952  HGB 12.0 10.9*  HCT 36.8 33.7*    Assessment/Plan: Status post Cesarean section. Doing well postoperatively. Routine pp/post op care   Continue current care - MgSO4 for 24hrs after delivery ( 630am)   Patricia Berry 02/20/2021, 11:07 AM

## 2021-02-20 NOTE — Progress Notes (Signed)
Consents signed and all questions answered with patient. Move forward at this time Of note, seems to be extremely sensitive to IV labetalol. Current BP 90s/60, patient asymptomatic. uPC 0.13, 12/36.8/333, Cr 0.79, AST/ALT 33/16 (all WNL) BP (!) 93/54   Pulse 69   Temp 98 F (36.7 C) (Oral)   Resp 20   Ht 5' 4"  (1.626 m)   Wt (!) 176.1 kg   LMP 05/09/2020   SpO2 99%   BMI 66.65 kg/m

## 2021-02-20 NOTE — Lactation Note (Signed)
This note was copied from a baby's chart. Lactation Consultation Note  Patient Name: Patricia Berry [id number] Date: 02/20/2021 Reason for consult: Initial assessment;Mother's request;Difficult latch;Early term 37-38.6wks;Other (Comment) (PIH Mag Sulfate, Vit D Def ( with supplementation )) Age:34 hours  Mom nipples are inverted. With pre pumping with manual pump, get extension of nipple left more than right. Nipples are wide base infant struggles to get depth in the latch. 20NS used and infant increase in depth of swallow with breast compression.   Plan 1. To feed based on cues 8-12x in 24 hr period 2. Mom to offer breast first if unable to latch use 20 NS 3. Mom to hand express and do breast massage offer EBM via spoon if unable to get infant to latch. 4. DEBP q 3 hrs for 65mn  5 I and O sheet reviewed.  6. [place].  RN came in to take blood pressure.Flange size 24 assessed. RN to assist with Mom using DEBP after pressure completed.   All questions answered at the end of the visit.  Mom mentioned hx of low milk supply. We will work on latching and post pumping. DBM mentioned as an option if needed  Maternal Data Has patient been taught Hand Expression?: Yes Does the patient have breastfeeding experience prior to this delivery?: Yes How long did the patient breastfeed?: 1 month infant had tongue tie picked up late after already switched to formula.  Feeding Mother's Current Feeding Choice: Breast Milk  LATCH Score Latch: Repeated attempts needed to sustain latch, nipple held in mouth throughout feeding, stimulation needed to elicit sucking reflex.  Audible Swallowing: A few with stimulation  Type of Nipple: Inverted  Comfort (Breast/Nipple): Soft / non-tender  Hold (Positioning): Assistance needed to correctly position infant at breast and maintain latch.  LATCH Score: 5   Lactation Tools Discussed/Used Tools: Nipple  Shields;Pump;Flanges Nipple shield size: 20 Flange Size: 24 Breast pump type: Double-Electric Breast Pump Pump Education: Setup, frequency, and cleaning;Milk Storage Reason for Pumping: increase stimulation Pumping frequency: every 3 hrs for 15 min  Interventions Interventions: Breast feeding basics reviewed;Position options;Assisted with latch;Expressed milk;Skin to skin;Breast massage;Hand express;Pre-pump if needed;DEBP;Breast compression;Adjust position;Education;Support pillows  Discharge Pump: Personal  Consult Status Consult Status: Follow-up Date: 02/21/21 Follow-up type: In-patient    Patricia Berry  Patricia Berry 02/20/2021, 12:15 PM

## 2021-02-20 NOTE — Lactation Note (Signed)
This note was copied from a baby's chart. Lactation Consultation Note  Patient Name: Girl Sorina Derrig [id number] Date: 02/20/2021 Reason for consult: Follow-up assessment;Mother's request;Primapara;Early term 37-38.6wks;Other (Comment) (PID Hypertension, Vit D Def) Age:35 years  LC assisted with spoon feeding, infant gagging on arrival. Infant still cueing needing more volume we switched to extra slow flow nipple and pace bottle feeding.   LC received call from RN, infant needed more volume. LC completed DBM consent and placed DBM in fridge. Parents are aware DBM good for 1 hr, to bring amount needed for feeding to room temp before use.   Plan 1. To feed based on cues 8-12x in 24 hr period.  2. Mom to offer breast with help of 20 NS nipples are inverted.  3 If unable to latch, Mom to supplement with pace bottle feeding with extra slow flow nipple EBM first followed by DBM 10 ml or more if infant not able to latch.  4. Mom to pump with DEBP q 3hrs for 15 min.  All questions answered at the end of the visit.   Maternal Data    Feeding Mother's Current Feeding Choice: Breast Milk and Donor Milk Nipple Type: Extra Slow Flow  LATCH Score                    Lactation Tools Discussed/Used    Interventions Interventions: Breast feeding basics reviewed;DEBP;Education;Pace feeding  Discharge    Consult Status Consult Status: Follow-up Date: 02/21/21 Follow-up type: In-patient    Kerry-Anne Mezo  [name] 02/20/2021, 4:20 PM

## 2021-02-20 NOTE — Transfer of Care (Signed)
Immediate Anesthesia Transfer of Care Note  Patient: Patricia Berry  Procedure(s) Performed: CESAREAN SECTION  Patient Location: PACU  Anesthesia Type:Spinal  Level of Consciousness: awake, alert  and patient cooperative  Airway & Oxygen Therapy: Patient Spontanous Breathing  Post-op Assessment: Report given to RN and Post -op Vital signs reviewed and stable  Post vital signs: Reviewed and stable  Last Vitals:  Vitals Value Taken Time  BP 122/77 02/20/21 0721  Temp    Pulse 65 02/20/21 0726  Resp 20 02/20/21 0726  SpO2 97 % 02/20/21 0726  Vitals shown include unvalidated device data.  Last Pain:  Vitals:   02/19/21 2213  TempSrc: Oral  PainSc:          Complications: No notable events documented.

## 2021-02-21 LAB — CBC
HCT: 31 % — ABNORMAL LOW (ref 36.0–46.0)
Hemoglobin: 9.8 g/dL — ABNORMAL LOW (ref 12.0–15.0)
MCH: 26.7 pg (ref 26.0–34.0)
MCHC: 31.6 g/dL (ref 30.0–36.0)
MCV: 84.5 fL (ref 80.0–100.0)
Platelets: 254 10*3/uL (ref 150–400)
RBC: 3.67 MIL/uL — ABNORMAL LOW (ref 3.87–5.11)
RDW: 15.8 % — ABNORMAL HIGH (ref 11.5–15.5)
WBC: 8.4 10*3/uL (ref 4.0–10.5)
nRBC: 0 % (ref 0.0–0.2)

## 2021-02-21 MED ORDER — NIFEDIPINE ER OSMOTIC RELEASE 30 MG PO TB24
30.0000 mg | ORAL_TABLET | Freq: Once | ORAL | Status: AC
  Administered 2021-02-21: 30 mg via ORAL
  Filled 2021-02-21: qty 1

## 2021-02-21 MED ORDER — NIFEDIPINE ER OSMOTIC RELEASE 30 MG PO TB24
30.0000 mg | ORAL_TABLET | Freq: Every day | ORAL | Status: DC
  Administered 2021-02-21: 30 mg via ORAL
  Filled 2021-02-21: qty 1

## 2021-02-21 MED ORDER — NIFEDIPINE ER OSMOTIC RELEASE 30 MG PO TB24: 30.0000 mg | ORAL_TABLET | Freq: Every day | ORAL | Status: DC

## 2021-02-21 NOTE — Progress Notes (Signed)
Patient ID: Patricia Berry, female   DOB: Nov 19, 1985, 35 y.o.   MRN: [id number] Chart check   Pts BPs elevated despite procardia 30xl given at 11am today.  Spoke to nurse and recommended an additional procardia dose now.   Monitor BP q 4 hrs   Continue to titrate medication till BP adequately treated

## 2021-02-21 NOTE — Anesthesia Postprocedure Evaluation (Signed)
Anesthesia Post Note  Patient: Patricia Berry  Procedure(s) Performed: [place]     Patient location during evaluation: Mother Baby Anesthesia Type: Spinal Level of consciousness: oriented and awake and alert Pain management: pain level controlled Vital Signs Assessment: post-procedure vital signs reviewed and stable Respiratory status: spontaneous breathing and respiratory function stable Cardiovascular status: blood pressure returned to baseline and stable Postop Assessment: no headache, no backache, no apparent nausea or vomiting, able to ambulate and spinal receding Anesthetic complications: no   No notable events documented.  Last Vitals:  Vitals:   02/21/21 1244 02/21/21 1542  BP: (!) 152/88 (!) 151/99  Pulse: 85 95  Resp: 18 18  Temp: 37 C 36.7 C  SpO2: 99% 99%    Last Pain:  Vitals:   02/21/21 1542  TempSrc: Oral  PainSc:    Pain Goal:                   Patricia Berry

## 2021-02-21 NOTE — Lactation Note (Signed)
This note was copied from a baby's chart. Lactation Consultation Note  Patient Name: Patricia Berry [id number] Date: 02/21/2021 Reason for consult: Follow-up assessment;Mother's request;Early term 37-38.6wks;Infant weight loss;Hyperbilirubinemia (PIH Vit D def) Age:34 hours   Infant just had a bath on arrival and took 30 ml of DBM. Mom to call for latch assistance with next feeding.   Mom denies any pain with use of current 24 flanges. LC went over with Dad how to apply 20 NS during the visit.   Plan 1. To feed based on cues 8-12x 24 hr period 2. Mom to offer breast with help of 20 NS if unable to latch-- offer any ebm via spoon first followed by DBM 3. BF supplementation guide reviewed parents aware to offer 30 ml or more of DBM if infant unable to latch.  4. Pumping with dEBP q 3 hrs for 15 min  All question answered at the end of the visit.  Maternal Data    Feeding Mother's Current Feeding Choice: Breast Milk and Donor Milk Nipple Type: Extra Slow Flow  LATCH Score                    Lactation Tools Discussed/Used Tools: Pump;Flanges Nipple shield size: 20 Flange Size: 24 Breast pump type: Double-Electric Breast Pump;Manual Pump Education: Setup, frequency, and cleaning;Milk Storage Reason for Pumping: increase stimulation Pumping frequency: every 3 hrsf or 15 min  Interventions Interventions: Breast feeding basics reviewed;DEBP;Hand express;Expressed milk;Education;Pace feeding  Discharge    Consult Status Consult Status: Follow-up Date: 02/22/21 Follow-up type: In-patient    Glorianna Gott  [name] 02/21/2021, 1:10 PM

## 2021-02-21 NOTE — Progress Notes (Addendum)
Subjective: Postpartum Day 1: Cesarean Delivery Patient reports tolerating PO and + flatus. Pain well controlled with meds- has not needed oxycodone. She did have urinary retention last night that caused pelvic pain. She reports has successfully voided since in/out catheter done. She denies any HA, visual changes, CP or SOB. She is bonding well with baby. Attempting breastfeeding but also supplementing with donor milk and attempting pumping.  Objective: Vital signs in last 24 hours: Temp:  [97.5 F (36.4 C)-98 F (36.7 C)] 98 F (36.7 C) (09/04 0811) Pulse Rate:  [63-83] 83 (09/04 0811) Resp:  [17-18] 18 (09/04 0811) BP: (114-147)/(69-94) 147/94 (09/04 0811) SpO2:  [93 %-99 %] 99 % (09/04 0811)  Physical Exam:  General: alert, cooperative, and no distress Lochia: appropriate Uterine Fundus: firm Incision: no significant drainage DVT Evaluation: Calf/Ankle edema is present.  Recent Labs    02/20/21 0952 02/21/21 0535  HGB 10.9* 9.8*  HCT 33.7* 31.0*    Assessment/Plan: Status post Cesarean section. Doing well postoperatively.  S/P MgSo4 for 24hrs post operatively ( ended at 630am today) Recheck another BP and if also elevated will plan to start her on procardia 30xl qd Possible discharge to home tomorrow.  Isaiah Serge 02/21/2021, 9:46 AM

## 2021-02-22 ENCOUNTER — Encounter (HOSPITAL_COMMUNITY): Payer: Self-pay | Admitting: Obstetrics and Gynecology

## 2021-02-22 MED ORDER — LABETALOL HCL 100 MG PO TABS
100.0000 mg | ORAL_TABLET | Freq: Two times a day (BID) | ORAL | Status: DC
  Administered 2021-02-22 (×2): 100 mg via ORAL
  Filled 2021-02-22 (×2): qty 1

## 2021-02-22 MED ORDER — NIFEDIPINE ER OSMOTIC RELEASE 30 MG PO TB24
30.0000 mg | ORAL_TABLET | Freq: Once | ORAL | Status: AC
  Administered 2021-02-22: 30 mg via ORAL
  Filled 2021-02-22: qty 1

## 2021-02-22 MED ORDER — HYDRALAZINE HCL 20 MG/ML IJ SOLN: 10.0000 mg | INTRAMUSCULAR | Status: DC | PRN

## 2021-02-22 MED ORDER — LABETALOL HCL 5 MG/ML IV SOLN: 80.0000 mg | INTRAVENOUS | Status: DC | PRN

## 2021-02-22 MED ORDER — LABETALOL HCL 100 MG PO TABS: 100.0000 mg | ORAL_TABLET | Freq: Two times a day (BID) | ORAL | 1 refills | Status: DC

## 2021-02-22 MED ORDER — LABETALOL HCL 5 MG/ML IV SOLN: 20.0000 mg | INTRAVENOUS | Status: DC | PRN

## 2021-02-22 MED ORDER — LABETALOL HCL 5 MG/ML IV SOLN: 40.0000 mg | INTRAVENOUS | Status: DC | PRN

## 2021-02-22 MED ORDER — IBUPROFEN 600 MG PO TABS
600.0000 mg | ORAL_TABLET | Freq: Four times a day (QID) | ORAL | 0 refills | Status: DC
Start: 2021-02-22 — End: 2021-05-26

## 2021-02-22 MED ORDER — OXYCODONE HCL 5 MG PO TABS: 5.0000 mg | ORAL_TABLET | ORAL | 0 refills | Status: DC | PRN

## 2021-02-22 MED ORDER — NIFEDIPINE ER OSMOTIC RELEASE 60 MG PO TB24
60.0000 mg | ORAL_TABLET | Freq: Every day | ORAL | Status: DC
  Administered 2021-02-22: 60 mg via ORAL
  Filled 2021-02-22: qty 1

## 2021-02-22 MED ORDER — NIFEDIPINE ER 60 MG PO TB24: 60.0000 mg | ORAL_TABLET | Freq: Every day | ORAL | 0 refills | Status: DC

## 2021-02-22 NOTE — Lactation Note (Signed)
This note was copied from a baby's chart. Lactation Consultation Note  Patient Name: Patricia Berry [id number] Date: 02/22/2021 Reason for consult: Follow-up assessment;Early term 37-38.6wks;Infant weight loss;Other (Comment);Nipple pain/trauma (7 % weight loss) Age:34 hours Per mom baby recently fed donor milk 45 ml / sound asleep.  LC will check back with mom to check flanges with pumping/ due to soreness.  RN to obtain - coconut oil.    Maternal Data    Feeding Mother's Current Feeding Choice: Breast Milk and Donor Milk Nipple Type: Extra Slow Flow  LATCH Score                    Lactation Tools Discussed/Used Tools: Pump;Flanges Flange Size: 24 Breast pump type: Double-Electric Breast Pump  Interventions    Discharge    Consult Status Consult Status: Follow-up Date: 02/22/21 Follow-up type: In-patient    [place] 02/22/2021, 3:13 PM

## 2021-02-22 NOTE — Progress Notes (Signed)
Pt given discharge instructions and all questions answered. Pt instructed that the doctor's office will call her for her follow up appt on 9/9. Pt switched over to portable wound vac to go home.

## 2021-02-22 NOTE — Progress Notes (Signed)
Patient ID: BRENDALIZ KUK, female   DOB: 1985/11/05, 35 y.o.   MRN: [id number] Spoke to pt and nurse about BP Pt received additional dose of procardia 30xl at 1850pm yestday but BP remains elevated.   I recommend another dose of procardia now with closer follow up: check in 2 hrs If still elevated will need alternative medication  Labetalol protocol ordered Pt remains asymptomatic at this time

## 2021-02-22 NOTE — Lactation Note (Signed)
This note was copied from a baby's chart. Lactation Consultation Note  Patient Name: Patricia Berry [id number] Date: 02/22/2021 Reason for consult: Early term 37-38.6wks;Infant weight loss;Nipple pain/trauma Age:34 hours 2nd LC visit - worked on latch and depth / good milk flow.  [place] and RN shadow reassured mom and dad requiring LC plan.  Latch score 9 .  LC plan :  Breast shells while awake  Steps for latching - breast massage , hand express -  Allow the breast to lie on the pillow and bring the baby to the breast at the same level.  BF D/C discharges and mom to call for [facility] O/P appt .  Maternal Data Has patient been taught Hand Expression?: Yes  Feeding Mother's Current Feeding Choice: Breast Milk and Donor Milk  LATCH Score Latch: Grasps breast easily, tongue down, lips flanged, rhythmical sucking.  Audible Swallowing: Spontaneous and intermittent  Type of Nipple: Everted at rest and after stimulation  Comfort (Breast/Nipple): Filling, red/small blisters or bruises, mild/mod discomfort  Hold (Positioning): No assistance needed to correctly position infant at breast.  LATCH Score: 9   Lactation Tools Discussed/Used Tools: Shells;Pump;Flanges;Coconut oil;Comfort gels;Nipple Shields Nipple shield size: 24;Other (comment) (changed to #24 NS/ no NS needed this latch) Flange Size: 24;27 Breast pump type: Manual;Double-Electric Breast Pump Pump Education: Milk Storage  Interventions    Discharge Discharge Education: Engorgement and breast care;Outpatient recommendation;Other (comment) (mom plans to call when her milk comes in) Pump: Personal;Manual;DEBP WIC Program: No  Consult Status Consult Status: Complete Date: 02/22/21 Follow-up type: In-patient    [place] 02/22/2021, 5:11 PM

## 2021-02-22 NOTE — Progress Notes (Signed)
CSW received consult for hx of Anxiety and Depression.  CSW met with MOB to offer support and complete assessment.    CSW met with MOB at bedside. MOB was laying in bed and holding infant, FOB also present. CSW introduced self and explained role. MOB granted CSW verbal permission to speak in front of FOB about anything. CSW and MOB discussed MOB's mental health history. MOB reported that she was diagnosed with anxiety and depression in 2019 after losing their first baby. CSW offered condolences. MOB reported that her depression was situational and her anxiety has become more general. MOB reported that she is not currently taking any medication nor participating in therapy to treat her mental health diagnoses. MOB denied any current depressive symptoms. MOB described her anxiety as excessive worrying and racing thoughts. CSW inquired about coping skills, MOB reported that she just works through it and tries to be proactive and organized. CSW asked if MOB felt like she needed any medication or therapy to treat her anxiety, MOB reported no. MOB denied any postpartum depression after having her second child. CSW inquired about how MOB was feeling emotionally after giving birth, MOB reported that it has been a roller coaster but overall she has been feeling happy and thankful. MOB presented calm and did not demonstrate any acute mental health signs/symptoms. CSW assessed for safety, MOB denied SI and HI. CSW did not assess for domestic violence as FOB was present. CSW inquired about MOB's support system aside from FOB, MOB reported that they have family and friends as supports.   CSW provided education regarding the baby blues period vs. perinatal mood disorders, discussed treatment and gave resources for mental health follow up if concerns arise.  CSW recommends self-evaluation during the postpartum time period using the New Mom Checklist from Postpartum Progress and encouraged MOB to contact a medical professional  if symptoms are noted at any time.    CSW provided review of Sudden Infant Death Syndrome (SIDS) precautions. MOB verbalized understanding and reported that infant will sleep in a Halo basinet. MOB reported that they have all items needed to care for infant except for a monitor that they have not selected yet.    CSW identifies no further need for intervention and no barriers to discharge at this time.  Abundio Miu, [place] Worker [facility] Cell#: [phone]

## 2021-02-22 NOTE — Progress Notes (Signed)
Subjective: Postpartum Day 2: Cesarean Delivery Patient reports incisional pain, tolerating PO, and no problems voiding.  Ambulating but does feel more sore. No HA or PIH sx   Objective: Vital signs in last 24 hours: Temp:  [97.8 F (36.6 C)-98.6 F (37 C)] 97.8 F (36.6 C) (09/05 0737) Pulse Rate:  [80-95] 85 (09/05 0737) Resp:  [18-22] 18 (09/05 0737) BP: (138-155)/(88-101) 138/90 (09/05 0737) SpO2:  [94 %-99 %] 98 % (09/05 0330)  Physical Exam:  General: alert and cooperative Lochia: appropriate Uterine Fundus: difficult to palpate with habitus Incision: Vacuum dressing in place and intact DVT Evaluation: No evidence of DVT seen on physical exam, some edema  Recent Labs    02/20/21 0952 02/21/21 0535  HGB 10.9* 9.8*  HCT 33.7* 31.0*    Assessment/Plan: Status post Cesarean section. Doing well postoperatively.   Blood pressure remains elevated but improved.  Will increase procardia XL to 35m this AM and add labetalol 1075mpo BID Will follow BP throughout day and if controlled possible d/c this PM as pt would like to go  [employer]/10/2020, 8:43 AM

## 2021-02-22 NOTE — Discharge Summary (Signed)
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 yo G3P2001 at 347w4das admitted to the hospital 02/19/2021 for scheduled cesarean section with the following indication:Elective Repeat performed for gestational hypertension.  Delivery details are as follows:  Membrane Rupture Time/Date: 6:12 AM ,02/20/2021   Delivery Method:C-Section, Low Transverse  Details of operation can be found in separate operative note.  Patient had a postpartum course significant for persistent elevated blood pressures off magnesium and responded well to procrdia 6020mL and labetalol 100m58m BID. .  [place]arland [place] is ambulating, tolerating a regular diet, passing flatus, and urinating well. Patient is discharged home in stable condition on  02/22/21        Newborn Data: Birth date:02/20/2021  Birth time:6:13 AM  Gender:Female  Living status:Living  Apgars:9 ,9  Weight:2775 g     Magnesium Sulfate received: Yes: Seizure prophylaxis   Physical exam  Vitals:   02/22/21 0737 02/22/21 1143 02/22/21 1434 02/22/21 1528  BP: 138/90 125/80 (!) 141/81 130/86  Pulse: 85 89 82 89  Resp: 18 18  18   Temp: 97.8 F (36.6 C) 98.1 F (36.7 C)  98.1 F (36.7 C)  TempSrc:  Oral Oral  Oral  SpO2:  99%  99%  Weight:      Height:       General: alert and cooperative Lochia: appropriate Uterine Fundus: hard to palpate with habitus Incision: Dressing is clean, dry, and intact--vacuum dressing DVT Evaluation: No evidence of DVT seen on physical exam. Labs: Lab Results  Component Value Date   WBC 8.4 02/21/2021   HGB 9.8 (L) 02/21/2021   HCT 31.0 (L) 02/21/2021   MCV 84.5 02/21/2021   PLT 254 02/21/2021   CMP Latest Ref Rng & Units 02/20/2021  Glucose 70 - 99 mg/dL -  BUN 6 - 20 mg/dL -  Creatinine 0.44 - 1.00 mg/dL 0.68  Sodium 135 - 145 mmol/L -  Potassium 3.5 - 5.1 mmol/L -  Chloride 98 - 111 mmol/L -  CO2 22 - 32 mmol/L -  Calcium 8.9 - 10.3 mg/dL -  Total Protein 6.5 - 8.1 g/dL -  Total Bilirubin 0.3 -  1.2 mg/dL -  Alkaline Phos 38 - 126 U/L -  AST 15 - 41 U/L -  ALT 0 - 44 U/L -   Edinburgh Score: Edinburgh Postnatal Depression Scale Screening Tool 02/21/2021  I have been able to laugh and see the funny side of things. 0  I have looked forward with enjoyment to things. 0  I have blamed myself unnecessarily when things went wrong. 1  I have been anxious or worried for no good reason. 2  I have felt scared or panicky for no good reason. 2  Things have been getting on top of me. 0  I have been so unhappy that I have had difficulty sleeping. 0  I have felt sad or miserable. 0  I have been so unhappy that I have been crying. 0  The thought of harming myself has occurred to me. 0  Edinburgh Postnatal Depression Scale Total 5     After visit meds:  Allergies as of 02/22/2021   No Known Allergies      Medication List     STOP taking these medications    aspirin EC 81 MG tablet       TAKE these medications    ibuprofen 600 MG tablet Commonly known as: ADVIL Take 1 tablet (600 mg total) by mouth every 6 (six) hours.   labetalol 100 MG tablet Commonly known as: NORMODYNE Take 1 tablet (100 mg total) by mouth 2 (two) times  daily.   NIFEdipine 60 MG 24 hr tablet Commonly known as: ADALAT CC Take 1 tablet (60 mg total) by mouth daily. Start taking on: February 23, 2021   ONE-A-DAY WOMENS PRENATAL PO Take by mouth.   oxyCODONE 5 MG immediate release tablet Commonly known as: Oxy IR/ROXICODONE Take 1-2 tablets (5-10 mg total) by mouth every 4 (four) hours as needed for moderate pain.   Vitamin D (Cholecalciferol) 10 MCG (400 UNIT) Caps Take by mouth.               Discharge Care Instructions  (From admission, onward)           Start     Ordered   02/22/21 0000  Leave dressing on - Keep it clean, dry, and intact until clinic visit       Comments: We will remove in office in 1 week   02/22/21 1555             Discharge home in stable condition Infant Feeding: Bottle and Breast Infant Disposition:home with mother Discharge instruction: per After Visit Summary and Postpartum booklet. Activity: Advance as tolerated. Pelvic rest for 6 weeks.  Diet: routine diet Future Appointments: Future Appointments  Date Time Provider [place]  02/23/2021 12:30 PM [facility] PAT 1 [facility]INDC None   Follow up Visit:  Follow-up Information     Shivaji, Melida Quitter, MD Follow up.   Specialty: Obstetrics and Gynecology Why: Incision and blood pressure check Friday 02/26/21 Contact information: [address] [place] [address] [phone]                  Please schedule this patient for a In person postpartum visit in 4 days with the following provider: MD to removed dressing and check blood pressure.  Delivery mode:  C-Section, Low Transverse  Anticipated Birth Control:  Unsure   02/22/2021 Logan Bores, MD

## 2021-02-23 ENCOUNTER — Encounter (HOSPITAL_COMMUNITY)
Admission: RE | Admit: 2021-02-23 | Discharge: 2021-02-23 | Disposition: A | Discharge status: Home / Self Care | Payer: BC Managed Care – PPO | Source: Ambulatory Visit | Attending: Obstetrics and Gynecology | Admitting: Obstetrics and Gynecology

## 2021-02-24 ENCOUNTER — Telehealth: Payer: Self-pay | Admitting: *Deleted

## 2021-02-24 LAB — SURGICAL PATHOLOGY

## 2021-02-24 NOTE — Telephone Encounter (Signed)
Transition Care Management Unsuccessful Follow-up Telephone Call  Date of discharge and from where:  02/20/2021   [facility] ([facility])  Attempts:  1st Attempt  Reason for unsuccessful TCM follow-up call:  Left voice message  Jacqlyn Larsen [facility], BSN RN Case Manager [phone]

## 2021-03-05 ENCOUNTER — Telehealth: Payer: Self-pay

## 2021-03-05 NOTE — Telephone Encounter (Signed)
Spoke with pt and informed her of provider's instructions. Pt was advise to also sched a CPE with [place] at her convenience

## 2021-03-05 NOTE — Telephone Encounter (Signed)
Pt has taken valACYclovir (VALTREX) 1000 MG tablet  in most recent past. Please advise

## 2021-03-05 NOTE — Telephone Encounter (Signed)
Patient has not been seen by this provider in over a year and she is pregnant.  I would encourage her to call her obstetrician to refill medication.

## 2021-03-05 NOTE — Telephone Encounter (Signed)
Patient would like refill on prescription for cold sore. Patient can be reached at [phone].  [facility] [place]  valACYclovir (VALTREX) 1000 MG tablet

## 2021-03-06 ENCOUNTER — Telehealth (HOSPITAL_COMMUNITY): Payer: Self-pay

## 2021-03-06 NOTE — Telephone Encounter (Signed)
"  I'm doing good. I have had 2 follow up appointments with my OB-GYN and everything is good." Patient has no other questions or concerns about her healing.   "She's perfect. She gained weight at her last appointment. Her next pediatrician appointment is on Monday. She is eating well. I'm breastfeeding some, pumping, and supplementing with formula. Still working on getting her to latch on. When can we start to give a pacifier? Sometimes she is more fussy at night." RN told patient that it's recommended to wait until breastfeeding is well established before giving a pacifier. RN recommended to reach out to our lactation resources. RN explained outpatient lactation appointment resource and [facility] lactation phone support line. Patient states that she has this information. "She sleeps in a bassinet next to our bed."  RN reviewed ABC's of safe sleep with patient. Patient declines any questions or concerns about baby.  EPDS score is 3.  Sharyn Lull [facility] 03/06/2021,1149

## 2021-04-12 ENCOUNTER — Other Ambulatory Visit: Payer: Self-pay

## 2021-04-12 DIAGNOSIS — D225 Melanocytic nevi of trunk: Secondary | ICD-10-CM | POA: Insufficient documentation

## 2021-04-12 DIAGNOSIS — L700 Acne vulgaris: Secondary | ICD-10-CM | POA: Insufficient documentation

## 2021-04-13 ENCOUNTER — Encounter: Payer: Self-pay | Admitting: Family Medicine

## 2021-04-13 ENCOUNTER — Ambulatory Visit (INDEPENDENT_AMBULATORY_CARE_PROVIDER_SITE_OTHER): Payer: BC Managed Care – PPO | Admitting: Family Medicine

## 2021-04-13 VITALS — BP 135/83 | HR 75 | Temp 98.0°F | Ht 64.0 in | Wt 361.0 lb

## 2021-04-13 DIAGNOSIS — Z8249 Family history of ischemic heart disease and other diseases of the circulatory system: Secondary | ICD-10-CM | POA: Diagnosis not present

## 2021-04-13 DIAGNOSIS — Z130 Encounter for screening for diseases of the blood and blood-forming organs and certain disorders involving the immune mechanism: Secondary | ICD-10-CM | POA: Diagnosis not present

## 2021-04-13 DIAGNOSIS — Z6841 Body Mass Index (BMI) 40.0 and over, adult: Secondary | ICD-10-CM

## 2021-04-13 DIAGNOSIS — E559 Vitamin D deficiency, unspecified: Secondary | ICD-10-CM

## 2021-04-13 DIAGNOSIS — Z0000 Encounter for general adult medical examination without abnormal findings: Secondary | ICD-10-CM | POA: Diagnosis not present

## 2021-04-13 DIAGNOSIS — Z23 Encounter for immunization: Secondary | ICD-10-CM

## 2021-04-13 DIAGNOSIS — Z131 Encounter for screening for diabetes mellitus: Secondary | ICD-10-CM

## 2021-04-13 LAB — LIPID PANEL
Cholesterol: 168 mg/dL (ref 0–200)
HDL: 41.1 mg/dL (ref >39.00)
LDL Cholesterol: 92 mg/dL (ref 0–99)
NonHDL: 127.03
Total CHOL/HDL Ratio: 4
Triglycerides: 177 mg/dL — ABNORMAL HIGH (ref 0.0–149.0)
VLDL: 35.4 mg/dL (ref 0.0–40.0)

## 2021-04-13 LAB — CBC
HCT: 37.8 % (ref 36.0–46.0)
Hemoglobin: 12.4 g/dL (ref 12.0–15.0)
MCHC: 32.9 g/dL (ref 30.0–36.0)
MCV: 80.5 fl (ref 78.0–100.0)
Platelets: 300 10*3/uL (ref 150.0–400.0)
RBC: 4.69 Mil/uL (ref 3.87–5.11)
RDW: 15.6 % — ABNORMAL HIGH (ref 11.5–15.5)
WBC: 7.8 10*3/uL (ref 4.0–10.5)

## 2021-04-13 LAB — COMPREHENSIVE METABOLIC PANEL
ALT: 51 U/L — ABNORMAL HIGH (ref 0–35)
AST: 30 U/L (ref 0–37)
Albumin: 4.2 g/dL (ref 3.5–5.2)
Alkaline Phosphatase: 57 U/L (ref 39–117)
BUN: 9 mg/dL (ref 6–23)
CO2: 30 mEq/L (ref 19–32)
Calcium: 9.4 mg/dL (ref 8.4–10.5)
Chloride: 103 mEq/L (ref 96–112)
Creatinine, Ser: 0.87 mg/dL (ref 0.40–1.20)
GFR: 87.16 mL/min (ref >60.00)
Glucose, Bld: 89 mg/dL (ref 70–99)
Potassium: 4.1 mEq/L (ref 3.5–5.1)
Sodium: 140 mEq/L (ref 135–145)
Total Bilirubin: 0.5 mg/dL (ref 0.2–1.2)
Total Protein: 6.7 g/dL (ref 6.0–8.3)

## 2021-04-13 LAB — HEMOGLOBIN A1C: Hgb A1c MFr Bld: 5.2 % (ref 4.6–6.5)

## 2021-04-13 LAB — VITAMIN D 25 HYDROXY (VIT D DEFICIENCY, FRACTURES): VITD: 28.22 ng/mL — ABNORMAL LOW (ref 30.00–100.00)

## 2021-04-13 NOTE — Patient Instructions (Addendum)
Great to see you today.  I have refilled the medication(s) we provide.   If labs were collected, we will inform you of lab results once received either by echart message or telephone call.   - echart message- for normal results that have been seen by the patient already.   - telephone call: abnormal results or if patient has not viewed results in their echart. Health Maintenance, Female Adopting a healthy lifestyle and getting preventive care are important in promoting health and wellness. Ask your health care provider about: The right schedule for you to have regular tests and exams. Things you can do on your own to prevent diseases and keep yourself healthy. What should I know about diet, weight, and exercise? Eat a healthy diet  Eat a diet that includes plenty of vegetables, fruits, low-fat dairy products, and lean protein. Do not eat a lot of foods that are high in solid fats, added sugars, or sodium. Maintain a healthy weight Body mass index (BMI) is used to identify weight problems. It estimates body fat based on height and weight. Your health care provider can help determine your BMI and help you achieve or maintain a healthy weight. Get regular exercise Get regular exercise. This is one of the most important things you can do for your health. Most adults should: Exercise for at least 150 minutes each week. The exercise should increase your heart rate and make you sweat (moderate-intensity exercise). Do strengthening exercises at least twice a week. This is in addition to the moderate-intensity exercise. Spend less time sitting. Even light physical activity can be beneficial. Watch cholesterol and blood lipids Have your blood tested for lipids and cholesterol at 34 years of age, then have this test every 5 years. Have your cholesterol levels checked more often if: Your lipid or cholesterol levels are high. You are older than 34 years of age. You are at high risk for heart  disease. What should I know about cancer screening? Depending on your health history and family history, you may need to have cancer screening at various ages. This may include screening for: Breast cancer. Cervical cancer. Colorectal cancer. Skin cancer. Lung cancer. What should I know about heart disease, diabetes, and high blood pressure? Blood pressure and heart disease High blood pressure causes heart disease and increases the risk of stroke. This is more likely to develop in people who have high blood pressure readings, are of African descent, or are overweight. Have your blood pressure checked: Every 3-5 years if you are 4-60 years of age. Every year if you are 51 years old or older. Diabetes Have regular diabetes screenings. This checks your fasting blood sugar level. Have the screening done: Once every three years after age 73 if you are at a normal weight and have a low risk for diabetes. More often and at a younger age if you are overweight or have a high risk for diabetes. What should I know about preventing infection? Hepatitis B If you have a higher risk for hepatitis B, you should be screened for this virus. Talk with your health care provider to find out if you are at risk for hepatitis B infection. Hepatitis C Testing is recommended for: Everyone born from [birth year] through 1964. Anyone with known risk factors for hepatitis C. Sexually transmitted infections (STIs) Get screened for STIs, including gonorrhea and chlamydia, if: You are sexually active and are younger than 34 years of age. You are older than 34 years of age and your  health care provider tells you that you are at risk for this type of infection. Your sexual activity has changed since you were last screened, and you are at increased risk for chlamydia or gonorrhea. Ask your health care provider if you are at risk. Ask your health care provider about whether you are at high risk for HIV. Your health care provider  may recommend a prescription medicine to help prevent HIV infection. If you choose to take medicine to prevent HIV, you should first get tested for HIV. You should then be tested every 3 months for as long as you are taking the medicine. Pregnancy If you are about to stop having your period (premenopausal) and you may become pregnant, seek counseling before you get pregnant. Take 400 to 800 micrograms (mcg) of folic acid every day if you become pregnant. Ask for birth control (contraception) if you want to prevent pregnancy. Osteoporosis and menopause Osteoporosis is a disease in which the bones lose minerals and strength with aging. This can result in bone fractures. If you are 56 years old or older, or if you are at risk for osteoporosis and fractures, ask your health care provider if you should: Be screened for bone loss. Take a calcium or vitamin D supplement to lower your risk of fractures. Be given hormone replacement therapy (HRT) to treat symptoms of menopause. Follow these instructions at home: Lifestyle Do not use any products that contain nicotine or tobacco, such as cigarettes, e-cigarettes, and chewing tobacco. If you need help quitting, ask your health care provider. Do not use street drugs. Do not share needles. Ask your health care provider for help if you need support or information about quitting drugs. Alcohol use Do not drink alcohol if: Your health care provider tells you not to drink. You are pregnant, may be pregnant, or are planning to become pregnant. If you drink alcohol: Limit how much you use to 0-1 drink a day. Limit intake if you are breastfeeding. Be aware of how much alcohol is in your drink. In the [place] one drink equals one 12 oz bottle of beer (355 mL), one 5 oz glass of wine (148 mL), or one 1 oz glass of hard liquor (44 mL). General instructions Schedule regular health, dental, and eye exams. Stay current with your vaccines. Tell your health care  provider if: You often feel depressed. You have ever been abused or do not feel safe at home. Summary Adopting a healthy lifestyle and getting preventive care are important in promoting health and wellness. Follow your health care provider's instructions about healthy diet, exercising, and getting tested or screened for diseases. Follow your health care provider's instructions on monitoring your cholesterol and blood pressure. This information is not intended to replace advice given to you by your health care provider. Make sure you discuss any questions you have with your health care provider. Document Revised: 08/14/2020 Document Reviewed: 05/30/2018 Elsevier Patient Education  2022 [employer].     If you are age 52 or younger, your body mass index should be between 19-25. Your Body mass index is Body mass index is 61.97 kg/m. Marland Kitchen If this is above the aformentioned range listed, you are consider overwieght and obese if BMI > 30, by medical definition and standards. Routine daily exercise and dietary modifications are encouraged. If you would like additional guidance on weight loss, please make an appointment for weight loss counseling - must be an appointment dedicate to weight loss counseling alone. I would be happy  to help you.

## 2021-04-13 NOTE — Progress Notes (Signed)
This visit occurred during the SARS-CoV-2 public health emergency.  Safety protocols were in place, including screening questions prior to the visit, additional usage of staff PPE, and extensive cleaning of exam room while observing appropriate contact time as indicated for disinfecting solutions.    Patient ID: Patricia Berry, female  DOB: 06-Mar-1986, 35 y.o.   MRN: [id number] Patient Care Team    Relationship Specialty Notifications Start End  Ma Hillock, DO PCP - General Family Medicine  08/01/16   Janyth Contes, MD Consulting Physician Obstetrics and Gynecology  12/17/19   Skeet Latch, MD Attending Physician Cardiology  12/17/19   Jerene Bears, MD Consulting Physician Gastroenterology  12/17/19     Chief Complaint  Patient presents with   Annual Exam    Pt is fasting    Subjective: [name] is a 35 y.o.  Female  present for CPE. All past medical history, surgical history, allergies, family history, immunizations, medications and social history were updated in the electronic medical record today. All recent labs, ED visits and hospitalizations within the last year were reviewed.  Health maintenance:  Colonoscopy: no fhx. Routine screen at 65 Mammogram: no fhx, routine screen at 41 Cervical cancer screening: last pap: 06/2020, GYN Immunizations: tdap 2022, Influenza UTD> today (encouraged yearly), COVID vaccines completed.  Infectious disease screening: HIV and Hep C completed DEXA: routine  Assistive device: none Oxygen NUU:VOZD Patient has a Dental home. Hospitalizations/ED visits: reviewed  Depression screen [facility] 2/9 04/13/2021 12/17/2019 12/12/2017 08/01/2016  Decreased Interest 0 0 2 0  Down, Depressed, Hopeless 0 0 3 0  PHQ - 2 Score 0 0 5 0  Altered sleeping - - 3 -  Tired, decreased energy - - 3 -  Change in appetite - - 2 -  Feeling bad or failure about yourself  - - 3 -  Trouble concentrating - - 2 -  Moving slowly or fidgety/restless -  - 0 -  Suicidal thoughts - - 0 -  PHQ-9 Score - - 18 -  Difficult doing work/chores - - Not difficult at all -   No flowsheet data found.   Immunization History  Administered Date(s) Administered   Influenza,inj,Quad PF,6+ Mos 03/20/2017, 04/13/2021   Influenza,inj,quad, With Preservative 06/03/2020   Influenza-Unspecified 03/20/2017, 07/06/2018   PFIZER(Purple Top)SARS-COV-2 Vaccination 08/16/2019, 09/06/2019, 06/10/2020   PPD Test 06/17/2014   Tdap 08/03/2017, 11/05/2018, 12/07/2020    Past Medical History:  Diagnosis Date   Anxiety    Cholestasis    Cholestasis during pregnancy    Depression    Heart disease    Hepatic steatosis    History of cholestasis during pregnancy 12/12/2017   Delivered April 2019, fetal demise   History of cold sores    History of fetal demise, not currently pregnant 12/12/2017   History of peripheral edema 04/25/2014   History of pre-eclampsia    Hypertension    NASH (nonalcoholic steatohepatitis)    Obesity    S/P cesarean section 01/10/2019   Status post vacuum-assisted vaginal delivery Nov 01, 2017   fetal death   Vitamin D deficiency    No Known Allergies Past Surgical History:  Procedure Laterality Date   APPENDECTOMY     CESAREAN SECTION N/A 01/10/2019   Procedure: CESAREAN SECTION;  Surgeon: Janyth Contes, MD;  Location: [facility];  Service: Obstetrics;  Laterality: N/A;  TRacyRNFA, Heather if she can   CESAREAN SECTION N/A 02/20/2021   Procedure: CESAREAN SECTION;  Surgeon: Deliah Boston, MD;  Location: [facility] LD ORS;  Service: Obstetrics;  Laterality: N/A;  Repeat C/S increased BP   LAPAROSCOPIC APPENDECTOMY N/A 08/04/2017   Procedure: APPENDECTOMY LAPAROSCOPIC;  Surgeon: Alphonsa Overall, MD;  Location: [facility] ORS;  Service: General;  Laterality: N/A;   WISDOM TOOTH EXTRACTION     Family History  Problem Relation Age of Onset   Hyperlipidemia Mother    Hypertension Mother    Mental illness Father    Hypertension Father     Cancer Maternal Aunt        small cell   Heart disease Maternal Grandmother        heart attack   Early death Maternal Grandmother    Prostate cancer Maternal Grandfather    Pancreatic cancer Maternal Grandfather    Mental illness Paternal Grandmother    Liver disease Neg Hx    Social History   Social History Narrative   [name] lives with her husband Octavia Bruckner. She works FT as a Careers information officer, 8th grade. She enjoys traveling over seas.   She has a master's degree.   Drinks caffeine.    Daily vitamin use.    Wears her seatbelt. Smoke detector in the home.    Exercises 3x week.    Feels safe in her relationships     Allergies as of 04/13/2021   No Known Allergies      Medication List        Accurate as of April 13, 2021 11:23 AM. If you have any questions, ask your nurse or doctor.          STOP taking these medications    cyclobenzaprine 10 MG tablet Commonly known as: FLEXERIL Stopped by: Howard Pouch, DO   labetalol 100 MG tablet Commonly known as: NORMODYNE Stopped by: Howard Pouch, DO   metFORMIN 500 MG tablet Commonly known as: GLUCOPHAGE Stopped by: Howard Pouch, DO   oxyCODONE 5 MG immediate release tablet Commonly known as: Oxy IR/ROXICODONE Stopped by: Howard Pouch, DO   tretinoin 0.025 % cream Commonly known as: RETIN-A Stopped by: Howard Pouch, DO   VITAMIN D PO Stopped by: Howard Pouch, DO       TAKE these medications    ibuprofen 600 MG tablet Commonly known as: ADVIL Take 1 tablet (600 mg total) by mouth every 6 (six) hours.   NIFEdipine 60 MG 24 hr tablet Commonly known as: PROCARDIA XL/NIFEDICAL XL Take 60 mg by mouth daily. What changed: Another medication with the same name was removed. Continue taking this medication, and follow the directions you see here. Changed by: Howard Pouch, DO   ONE-A-DAY WOMENS PRENATAL PO Take by mouth.   Vitamin D (Cholecalciferol) 10 MCG (400 UNIT) Caps Take by mouth.   VITAMIN E  PO        All past medical history, surgical history, allergies, family history, immunizations andmedications were updated in the EMR today and reviewed under the history and medication portions of their EMR.       No results found.   ROS: 14 pt review of systems performed and negative (unless mentioned in an HPI)  Objective: BP 135/83   Pulse 75   Temp 98 F (36.7 C) (Oral)   Ht 5' 4"  (1.626 m)   Wt (!) 361 lb (163.7 kg)   LMP 05/09/2020   SpO2 97%   BMI 61.97 kg/m  Gen: Afebrile. No acute distress. Nontoxic in appearance, well-developed, well-nourished,  very pleasant obese female.  HENT: AT. [place]. Bilateral TM visualized and  normal in appearance, normal external auditory canal. MMM, no oral lesions, adequate dentition. Bilateral nares within normal limits. Throat without erythema, ulcerations or exudates. no Cough on exam, no hoarseness on exam. Eyes:Pupils Equal Round Reactive to light, Extraocular movements intact,  Conjunctiva without redness, discharge or icterus. Neck/lymp/endocrine: Supple,no lymphadenopathy, no thyromegaly CV: RRR no murmur, no edema, +2/4 P posterior tibialis pulses.  Chest: CTAB, no wheeze, rhonchi or crackles. normal Respiratory effort. good Air movement. Abd: Soft. obese. NTND. BS presenet. no Masses palpated. No hepatosplenomegaly. No rebound tenderness or guarding. Skin: no rashes, purpura or petechiae. Warm and well-perfused. Skin intact. Neuro/Msk: Normal gait. PERLA. EOMi. Alert. Oriented x3.  Cranial nerves II through XII intact. Muscle strength 5/5 upper/lower extremity. DTRs equal bilaterally. Psych: Normal affect, dress and demeanor. Normal speech. Normal thought content and judgment.   No results found.  Assessment/plan: TELETHA PETREA is a 35 y.o. female present for CPE Influenza vaccine needed - Flu Vaccine QUAD 6+ mos PF IM (Fluarix Quad PF) FH: heart disease/BMI 60.0-69.9, adult (HCC)/Morbid obesity (HCC) Recent  pregnancy/birth.  - Lipid panel Vitamin D deficiency - VITAMIN D 25 Hydroxy (Vit-D Deficiency, Fractures) Diabetes mellitus screening - Comprehensive metabolic panel - Hemoglobin A1c Screening for deficiency anemia - CBC Routine general medical examination at a health care facility Colonoscopy: no fhx. Routine screen at 40 Mammogram: no fhx, routine screen at 40 Cervical cancer screening: last pap: 06/2020, GYN Immunizations: tdap 2022, Influenza UTD> today (encouraged yearly), COVID vaccines completed.  Infectious disease screening: HIV and Hep C completed DEXA: routine  Patient was encouraged to exercise greater than 150 minutes a week. Patient was encouraged to choose a diet filled with fresh fruits and vegetables, and lean meats. AVS provided to patient today for education/recommendation on gender specific health and safety maintenance.  Return in about 1 year (around 04/14/2022) for CPE (30 min).   Orders Placed This Encounter  Procedures   Flu Vaccine QUAD 6+ mos PF IM (Fluarix Quad PF)   CBC   Comprehensive metabolic panel   Hemoglobin A1c   Lipid panel   VITAMIN D 25 Hydroxy (Vit-D Deficiency, Fractures)   No orders of the defined types were placed in this encounter.  Referral Orders  No referral(s) requested today     Electronically signed by: Howard Pouch, [place]

## 2021-04-14 ENCOUNTER — Telehealth: Payer: Self-pay | Admitting: Family Medicine

## 2021-04-14 NOTE — Telephone Encounter (Signed)
Spoke with pt regarding BP meds and instructions. Pt states she is not currently taking BP meds was on medication during her pregnancy. Pt was informed to stop all bp meds after her 6 wk postpartum f/u and to have PCP to follow BP. Pt agreed to plan of not taking BP and will continue to monitor at home; Pt will sched f/u with PCP if BP is higher than 140/90 and will bring BP machine to appt for comparison.   FYI

## 2021-04-14 NOTE — Telephone Encounter (Signed)
Please return patient's call: Please clarify with patient or med use.  On check-in it was marked that she was no longer taking either of those blood pressure medications and her blood pressure was 135/83 during her appointment yesterday.  -If she is taking any blood pressure medicine, please verify med and dose and that she was taking it yesterday during her appointment.  We will then call her back with medication, plan and close follow-up appointment to reevaluate blood pressure on new medicine.  -If she is not currently taking the labetalol or nifedipine, then I would wait on restarting any blood pressure medications for now, based off of her vital signs during her appointment.   These advise.    Copy of phone note "Spoke with pt regarding results/recommendations,voiced understanding. She would like to know if she still needs to take current bp medication(labetalol and nifedipine). She would prefer a different bp med "

## 2021-04-14 NOTE — Telephone Encounter (Signed)
-----   Message from Emilee Hero, [place] sent at 04/13/2021  4:50 PM EDT ----- Spoke with pt regarding results/recommendations,voiced understanding. She would like to know if she still needs to take current bp medication(labetalol and nifedipine). She would prefer a different bp med

## 2021-05-24 ENCOUNTER — Other Ambulatory Visit: Payer: Self-pay

## 2021-05-24 ENCOUNTER — Encounter: Payer: Self-pay | Admitting: Family Medicine

## 2021-05-24 ENCOUNTER — Telehealth (INDEPENDENT_AMBULATORY_CARE_PROVIDER_SITE_OTHER): Payer: BC Managed Care – PPO | Admitting: Family Medicine

## 2021-05-24 VITALS — Wt 350.0 lb

## 2021-05-24 DIAGNOSIS — J111 Influenza due to unidentified influenza virus with other respiratory manifestations: Secondary | ICD-10-CM

## 2021-05-24 NOTE — Progress Notes (Signed)
Virtual Visit via Video Note  I connected with pt on 05/24/21 at  4:00 PM EST by a video enabled telemedicine application and verified that I am speaking with the correct person using two identifiers.  Location patient: home, [place] Location provider:work or home office Persons participating in the virtual visit: patient, provider  I discussed the limitations of evaluation and management by telemedicine and the availability of in person appointments. The patient expressed understanding and agreed to proceed.   HPI: 35 y/o female being seen today for cough. Onset about 24h ago: fever to 102, generalized body aches, sore throat, cough, and dizziness. Not much in the way of nasal congestion or runny nose.  No shortness of breath or wheezing.  Aleve taken yesterday, otherwise no meds.  ROS: See pertinent positives and negatives per HPI.  Past Medical History:  Diagnosis Date   Anxiety    Cholestasis    Cholestasis during pregnancy    Depression    Heart disease    Hepatic steatosis    History of cholestasis during pregnancy 12/12/2017   Delivered April 2019, fetal demise   History of cold sores    History of fetal demise, not currently pregnant 12/12/2017   History of peripheral edema 04/25/2014   History of pre-eclampsia    Hypertension    NASH (nonalcoholic steatohepatitis)    Obesity    S/P cesarean section 01/10/2019   Status post vacuum-assisted vaginal delivery 11/01/2017   fetal death   Vitamin D deficiency     Past Surgical History:  Procedure Laterality Date   APPENDECTOMY     CESAREAN SECTION N/A 01/10/2019   Procedure: CESAREAN SECTION;  Surgeon: Janyth Contes, MD;  Location: [facility] LD [facility]S;  Service: Obstetrics;  Laterality: N/A;  TRacyRNFA, Heather if she can   CESAREAN SECTION N/A 02/20/2021   Procedure: CESAREAN SECTION;  Surgeon: Deliah Boston, MD;  Location: [facility];  Service: Obstetrics;  Laterality: N/A;  Repeat C/S increased BP   LAPAROSCOPIC  APPENDECTOMY N/A 08/04/2017   Procedure: APPENDECTOMY LAPAROSCOPIC;  Surgeon: Alphonsa Overall, MD;  Location: [facility] ORS;  Service: General;  Laterality: N/A;   WISDOM TOOTH EXTRACTION     MEDS: no current rx meds   EXAM:  VITALS per patient if applicable:  Vitals with BMI 05/24/2021 04/13/2021 02/22/2021  Height - 5' 4"  -  Weight 350 lbs 361 lbs -  BMI - 29.56 -  Systolic - 213 086  Diastolic - 83 86  Pulse - 75 89    GENERAL: alert, oriented, appears well and in no acute distress  HEENT: atraumatic, conjunttiva clear, no obvious abnormalities on inspection of external nose and ears  NECK: normal movements of the head and neck  LUNGS: on inspection no signs of respiratory distress, breathing rate appears normal, no obvious gross SOB, gasping or wheezing  CV: no obvious cyanosis  MS: moves all visible extremities without noticeable abnormality  PSYCH/NEURO: pleasant and cooperative, no obvious depression or anxiety, speech and thought processing grossly intact  LABS: none today    Chemistry      Component Value Date/Time   NA 140 04/13/2021 1132   K 4.1 04/13/2021 1132   CL 103 04/13/2021 1132   CO2 30 04/13/2021 1132   BUN 9 04/13/2021 1132   CREATININE 0.87 04/13/2021 1132   CREATININE 0.81 10/27/2016 1018      Component Value Date/Time   CALCIUM 9.4 04/13/2021 1132   ALKPHOS 57 04/13/2021 1132   AST 30 04/13/2021 1132  ALT 51 (H) 04/13/2021 1132   BILITOT 0.5 04/13/2021 1132     ASSESSMENT AND PLAN:  Discussed the following assessment and plan:  Influenza-like illness.  Currently we are in a flu epidemic, so we will treat this as influenza. COVID test negative at home today. She is within the 48-hour treatment window for Tamiflu. She has a 44-monthold infant but she is not breast-feeding. Continue Aleve every 8 hours as needed and okay to do over-the-counter cold/flu medicine as needed.  Emphasized the importance of good hydration and rest.  I discussed the  assessment and treatment plan with the patient. The patient was provided an opportunity to ask questions and all were answered. The patient agreed with the plan and demonstrated an understanding of the instructions.   F/u: if not signif improving in 3-4d  Signed:  PCrissie Sickles MD           05/24/2021

## 2021-05-25 ENCOUNTER — Encounter: Payer: Self-pay | Admitting: Family Medicine

## 2021-05-25 ENCOUNTER — Telehealth: Payer: Self-pay

## 2021-05-25 ENCOUNTER — Telehealth: Payer: Self-pay | Admitting: Family Medicine

## 2021-05-25 NOTE — Telephone Encounter (Signed)
Pt called in and said she had an appt yesterday with Dr Anitra Lauth and she thinks she is having a allergic reaction to the med prescribed, she said it was a rash a blisters on her hands. She said she wanted to talk to a nurse. Transferred to [employer]

## 2021-05-25 NOTE — Telephone Encounter (Addendum)
FYI. Please see below.  Spoke with pt and she will send mychart picture for further recommendations. Advised of current instructions. Declined appt at this time

## 2021-05-25 NOTE — Telephone Encounter (Signed)
Spoke with pt regarding reaction, currently experiencing tingling, swelling, blisters on hands. Last dose of Tamiflu was last night around 7:30 and she also took 41m of nightquil. Denies shortness of breath.   Please review and advise   [8:32 AM] [name] saying that she thinks she is having a reaction to the medicine prescribed, she said it was a rash a blisters on her hands. She said she wanted to talk to a nurse. Do you have a moment?

## 2021-05-25 NOTE — Telephone Encounter (Signed)
LM for pt to returncall

## 2021-05-25 NOTE — Telephone Encounter (Signed)
Since this started after getting on tamiflu we have to assume it is likely an allergic rxn to this med. If she can send mychart picture that would be great. I recommend continuing nyquil since this has some benadryl in it. I can add her on/work in to the end of my morning today if she wants.

## 2021-05-25 NOTE — Telephone Encounter (Signed)
Pt sent in error and will be sending a new message with attachment for Dr.McGowen to review

## 2021-05-25 NOTE — Telephone Encounter (Signed)
Please review and advise.

## 2021-05-25 NOTE — Telephone Encounter (Signed)
Please review attachments and advise

## 2021-05-26 ENCOUNTER — Encounter: Payer: Self-pay | Admitting: Family Medicine

## 2021-05-26 ENCOUNTER — Other Ambulatory Visit: Payer: Self-pay

## 2021-05-26 ENCOUNTER — Telehealth (INDEPENDENT_AMBULATORY_CARE_PROVIDER_SITE_OTHER): Payer: BC Managed Care – PPO | Admitting: Family Medicine

## 2021-05-26 VITALS — Temp 97.0°F | Ht 64.0 in | Wt 355.0 lb

## 2021-05-26 DIAGNOSIS — B084 Enteroviral vesicular stomatitis with exanthem: Secondary | ICD-10-CM | POA: Diagnosis not present

## 2021-05-26 DIAGNOSIS — R21 Rash and other nonspecific skin eruption: Secondary | ICD-10-CM

## 2021-05-26 NOTE — Telephone Encounter (Signed)
Pt is scheduled for appt with PCP

## 2021-05-26 NOTE — Telephone Encounter (Signed)
[place] Night - Client TELEPHONE ADVICE RECORD [facility] Patient Name: Patricia Berry Gender: Female DOB: Dec 01, 1985 Age: 35 Y 1 M 24 D Return Phone Number: [phone] (Primary) Address: City/ State/ Zip: [place]  [address] Client [place] Night - Client Client Site [place] Night Provider Raoul Pitch, [place] Type Call Who Is Calling Patient / Member / Family / Caregiver Call Type Triage / Clinical Relationship To Patient Self Return Phone Number [phone] (Primary) Chief Complaint Allergic reaction (unknown symptoms) Reason for Call Symptomatic / Request for Health Information Initial Comment caller states she thinks she is having an allergic reaction to tamiflu. caller states she is experiencing burning hands with swelling and tingling and bumps that are spreading up her arm . [place] Not Listed UC Translation No Nurse Assessment Nurse: Luberta Mutter, RN, [name] Date/Time (Eastern Time): 05/25/2021 6:57:44 PM Confirm and document reason for call. If symptomatic, describe symptoms. ---caller states she thinks she is having an allergic reaction to tamiflu. caller states she is experiencing burning hands with swelling and itching tingling and bumps that are spreading up her arm. Looks like blisters on fingers. Does the patient have any new or worsening symptoms? ---Yes Will a triage be completed? ---Yes Related visit to physician within the last 2 weeks? ---Yes Does the PT have any chronic conditions? (i.e. diabetes, asthma, this includes High risk factors for pregnancy, etc.) ---No Is the patient pregnant or possibly pregnant? (Ask all females between the ages of 43-55) ---No Is this a behavioral health or substance abuse call? ---No Guidelines Guideline Title Affirmed Question Affirmed Notes Nurse Date/Time (Eastern Time) Rash - Widespread On Drugs Large or small blisters on skin  (i.e., fluid filled bubbles or sacs) Luberta Mutter, RN, [name] 05/25/2021 7:01:17 PM PLEASE NOTE: All timestamps contained within this report are represented as [place] Standard Time. CONFIDENTIALTY NOTICE: This fax transmission is intended only for the addressee. It contains information that is legally privileged, confidential or otherwise protected from use or disclosure. If you are not the intended recipient, you are strictly prohibited from reviewing, disclosing, copying using or disseminating any of this information or taking any action in reliance on or regarding this information. If you have received this fax in error, please notify us immediately by telephone so that we can arrange for its return to [place]. Phone: [phone], Toll-Free: [phone], Fax: [phone] Page: 2 of 2 Call Id: [id number] [place]. Time Eilene Ghazi Time) Disposition Final User 05/25/2021 7:12:06 PM See HCP within 4 Hours (or PCP triage) Yes Luberta Mutter, RN, Earlene Plater Disagree/Comply Comply Caller Understands Yes PreDisposition InappropriateToAsk Care Advice Given Per Guideline * IF OFFICE WILL BE CLOSED AND NO PCP (PRIMARY CARE PROVIDER) SECOND-LEVEL TRIAGE: You need to be seen within the next 3 or 4 hours. A nearby Urgent Care Center [facility]) is often a good source of care. Another choice is to go to the ED. Go sooner if you become worse. SEE HCP (OR PCP TRIAGE) WITHIN 4 HOURS: CALL BACK IF: * You become worse Referrals GO TO FACILITY OTHER - SPECIFY

## 2021-05-26 NOTE — Progress Notes (Signed)
This visit occurred during the SARS-CoV-2 public health emergency.  Safety   VIRTUAL VISIT VIA VIDEO  I connected with Patricia Berry on 05/26/21 at 10:45 AM EST by a video enabled telemedicine application and verified that I am speaking with the correct person using two identifiers. Location patient: Home Location provider: [facility], Office Persons participating in the virtual visit: Patient, Dr. Raoul Pitch and Cyndra Numbers, CMA  I discussed the limitations of evaluation and management by telemedicine and the availability of in person appointments. The patient expressed understanding and agreed to proceed.  Patricia Berry , 07-02-1985, 35 y.o., female MRN: [id number] Patient Care Team    Relationship Specialty Notifications Start End  Ma Hillock, DO PCP - General Family Medicine  08/01/16   Janyth Contes, MD Consulting Physician Obstetrics and Gynecology  12/17/19   Skeet Latch, MD Attending Physician Cardiology  12/17/19   Jerene Bears, MD Consulting Physician Gastroenterology  12/17/19     Chief Complaint  Patient presents with   Rash    Pt reports rash on hands, feet, arm, and around mouth, pt describes rash with burning, redness, blister which has worsening in the last 24 hours; Pt started tamiflu and Zzzquil Monday night; pt has d/c tamiflu;      Subjective: Pt presents for an OV with complaints of acute illness with rash.  She reports she had onset of flulike symptoms that started Sunday, December 4.  She noted a sore throat and fever.  She had a telehealth visit by another provider who prescribed Tamiflu for her.  Patient reports she started the Tamiflu on Monday along with ZzzQuil.  On Monday she noted a fever/Tmax 101 F.  She is taking naproxen for her fevers and headache.  On Tuesday 12/6 she noted a rash developing on her hands and around her mouth.  The rash is also on her forearm.  She states the rash is red, looks like little blisters and is  uncomfortable/burning.  She has 2 young children at home that are in daycare.  She has had a mild cough, runny nose and fever intermittently over the last 4 days.  Her sore throat is improving.  She is tolerating p.o.  She is up-to-date on all her childhood vaccinations.  She does not believe her children are sick at this time. She reports she discontinued the Tamiflu and ZzzQuil. Immunization History  Administered Date(s) Administered   Influenza,inj,Quad PF,6+ Mos 03/20/2017, 04/13/2021   Influenza,inj,quad, With Preservative 06/03/2020   Influenza-Unspecified 03/20/2017, 07/06/2018   PFIZER(Purple Top)SARS-COV-2 Vaccination 08/16/2019, 09/06/2019, 06/10/2020   PPD Test 06/17/2014   Tdap 08/03/2017, 11/05/2018, 12/07/2020    Depression screen [facility] 2/9 04/13/2021 12/17/2019 12/12/2017 08/01/2016  Decreased Interest 0 0 2 0  Down, Depressed, Hopeless 0 0 3 0  PHQ - 2 Score 0 0 5 0  Altered sleeping - - 3 -  Tired, decreased energy - - 3 -  Change in appetite - - 2 -  Feeling bad or failure about yourself  - - 3 -  Trouble concentrating - - 2 -  Moving slowly or fidgety/restless - - 0 -  Suicidal thoughts - - 0 -  PHQ-9 Score - - 18 -  Difficult doing work/chores - - Not difficult at all -    No Known Allergies Social History   Social History Narrative   Ms. Skillman lives with her husband Octavia Bruckner. She works FT as a Careers information officer, 8th grade. She enjoys  traveling over seas.   She has a master's degree.   Drinks caffeine.    Daily vitamin use.    Wears her seatbelt. Smoke detector in the home.    Exercises 3x week.    Feels safe in her relationships    Past Medical History:  Diagnosis Date   Anxiety    Cholestasis    Cholestasis during pregnancy    Depression    Heart disease    Hepatic steatosis    History of cholestasis during pregnancy 12/12/2017   Delivered April 2019, fetal demise   History of cold sores    History of fetal demise, not currently pregnant 12/12/2017    History of peripheral edema 04/25/2014   History of pre-eclampsia    Hypertension    NASH (nonalcoholic steatohepatitis)    Obesity    S/P cesarean section 01/10/2019   Status post vacuum-assisted vaginal delivery 2017/10/10   fetal death   Vitamin D deficiency    Past Surgical History:  Procedure Laterality Date   APPENDECTOMY     CESAREAN SECTION N/A 01/10/2019   Procedure: CESAREAN SECTION;  Surgeon: Janyth Contes, MD;  Location: [facility] LD ORS;  Service: Obstetrics;  Laterality: N/A;  TRacyRNFA, Heather if she can   CESAREAN SECTION N/A 02/20/2021   Procedure: CESAREAN SECTION;  Surgeon: Deliah Boston, MD;  Location: [facility] LD ORS;  Service: Obstetrics;  Laterality: N/A;  Repeat C/S increased BP   LAPAROSCOPIC APPENDECTOMY N/A 08/04/2017   Procedure: APPENDECTOMY LAPAROSCOPIC;  Surgeon: Alphonsa Overall, MD;  Location: [facility] ORS;  Service: General;  Laterality: N/A;   WISDOM TOOTH EXTRACTION     Family History  Problem Relation Age of Onset   Hyperlipidemia Mother    Hypertension Mother    Mental illness Father    Hypertension Father    Cancer Maternal Aunt        small cell   Heart disease Maternal Grandmother        heart attack   Early death Maternal Grandmother    Prostate cancer Maternal Grandfather    Pancreatic cancer Maternal Grandfather    Mental illness Paternal Grandmother    Liver disease Neg Hx    Allergies as of 05/26/2021   No Known Allergies      Medication List        Accurate as of May 26, 2021 10:57 AM. If you have any questions, ask your nurse or doctor.          STOP taking these medications    ibuprofen 600 MG tablet Commonly known as: ADVIL Stopped by: Howard Pouch, DO       TAKE these medications    NIFEdipine 60 MG 24 hr tablet Commonly known as: PROCARDIA XL/NIFEDICAL XL Take 60 mg by mouth daily.   ONE-A-DAY WOMENS PRENATAL PO Take by mouth.   Vitamin D (Cholecalciferol) 10 MCG (400 UNIT) Caps Take by mouth.         All past medical history, surgical history, allergies, family history, immunizations andmedications were updated in the EMR today and reviewed under the history and medication portions of their EMR.     ROS Negative, with the exception of above mentioned in HPI   Objective:  Temp (!) 97 F (36.1 C) (Temporal)   Ht 5' 4"  (1.626 m)   Wt (!) 355 lb (161 kg)   LMP 05/04/2020   Breastfeeding Unknown   BMI 60.94 kg/m  Body mass index is 60.94 kg/m.  Physical Exam Vitals and nursing  note reviewed.  Constitutional:      General: She is not in acute distress.    Appearance: Normal appearance. She is not ill-appearing or toxic-appearing.  HENT:     Head: Normocephalic and atraumatic.  Eyes:     General: No scleral icterus.       Right eye: No discharge.        Left eye: No discharge.     Conjunctiva/sclera: Conjunctivae normal.  Pulmonary:     Effort: Pulmonary effort is normal.  Skin:    Findings: Rash present.     Comments: Small singular raised erythemic patches.  Some areas with small blisters/vesicles.  Surrounding areas of the mouth.  Bilateral hands.  Bilateral forearms.  Neurological:     Mental Status: She is alert.     No results found. No results found. No results found for this or any previous visit (from the past 24 hour(s)).  Assessment/Plan: Patricia Berry is a 35 y.o. female present for OV for  Hand, foot and mouth disease/Rash Rest, hydrate.  OTC reported care discussed.  Her sore throat is greatly resolved. Tylenol/NSAIDs for comfort and fever. Can consider over-the-counter lidocaine creams for rash if uncomfortable. Discussed hygiene/handwashing and contagiousness of HFM F/U 2 weeks if not improved.  Sooner if worsening   Reviewed expectations re: course of current medical issues. Discussed self-management of symptoms. Outlined signs and symptoms indicating need for more acute intervention. Patient verbalized understanding and all questions  were answered. Patient received an After-Visit Summary.    No orders of the defined types were placed in this encounter.  No orders of the defined types were placed in this encounter.  Referral Orders  No referral(s) requested today     Note is dictated utilizing voice recognition software. Although note has been proof read prior to signing, occasional typographical errors still can be missed. If any questions arise, please do not hesitate to call for verification.   electronically signed by:  Howard Pouch, DO  [place]

## 2021-06-17 ENCOUNTER — Telehealth (INDEPENDENT_AMBULATORY_CARE_PROVIDER_SITE_OTHER): Payer: BC Managed Care – PPO | Admitting: Nurse Practitioner

## 2021-06-17 ENCOUNTER — Encounter: Payer: Self-pay | Admitting: Nurse Practitioner

## 2021-06-17 ENCOUNTER — Other Ambulatory Visit: Payer: Self-pay | Admitting: Nurse Practitioner

## 2021-06-17 ENCOUNTER — Other Ambulatory Visit: Payer: Self-pay

## 2021-06-17 VITALS — Temp 98.3°F

## 2021-06-17 DIAGNOSIS — J029 Acute pharyngitis, unspecified: Secondary | ICD-10-CM

## 2021-06-17 DIAGNOSIS — H938X9 Other specified disorders of ear, unspecified ear: Secondary | ICD-10-CM

## 2021-06-17 DIAGNOSIS — G4489 Other headache syndrome: Secondary | ICD-10-CM

## 2021-06-17 DIAGNOSIS — R509 Fever, unspecified: Secondary | ICD-10-CM | POA: Diagnosis not present

## 2021-06-17 DIAGNOSIS — R051 Acute cough: Secondary | ICD-10-CM

## 2021-06-17 DIAGNOSIS — R52 Pain, unspecified: Secondary | ICD-10-CM

## 2021-06-17 DIAGNOSIS — J069 Acute upper respiratory infection, unspecified: Secondary | ICD-10-CM

## 2021-06-17 LAB — POCT INFLUENZA A/B
Influenza A, POC: NEGATIVE
Influenza B, POC: NEGATIVE

## 2021-06-17 LAB — POC COVID19 BINAXNOW: SARS Coronavirus 2 Ag: NEGATIVE

## 2021-06-17 LAB — POCT RAPID STREP A (OFFICE): Rapid Strep A Screen: NEGATIVE

## 2021-06-17 MED ORDER — FLUTICASONE PROPIONATE 50 MCG/ACT NA SUSP: 2.0000 | Freq: Every day | NASAL | 0 refills | Status: DC

## 2021-06-17 NOTE — Assessment & Plan Note (Signed)
Patient tested negative for COVID, flu, and strep.  Did review test results with patient over the phone we will send in some Flonase for the ear pressure and discomfort.  Did discuss over-the-counter treatments to use for symptomatic management.  ED precautions reviewed

## 2021-06-17 NOTE — Progress Notes (Signed)
Patient ID: Patricia Berry, female    DOB: Feb 01, 1986, 35 y.o.   MRN: [id number]  Virtual visit completed through [place], a video enabled telemedicine application. Due to national recommendations of social distancing due to COVID-19, a virtual visit is felt to be most appropriate for this patient at this time. Reviewed limitations, risks, security and privacy concerns of performing a virtual visit and the availability of in person appointments. I also reviewed that there may be a patient responsible charge related to this service. The patient agreed to proceed.   Patient location: home Provider location: [place] at [facility], office Persons participating in this virtual visit: patient, provider   If any vitals were documented, they were collected by patient at home unless specified below.    Temp 98.3 F (36.8 C) Comment: per patient this morning   LMP 06/03/2021    Breastfeeding No    CC: URI Subjective:   HPI: Patricia Berry is a 35 y.o. female presenting on 06/17/2021 for Chills (Sx started on 06/15/21-Sore throat, sweats, headache, head congestion, fever-103.1 highest. Has been taking Dayquil, Nyquil and Mucinex. Covid test negative on 06/16/21)  Symptoms started on 06/15/2021 Covid tested 06/16/2021 morning and night both negatuive Vaccinated pfizer x2 and one booster. Nephews both had fevers. Dyquill, nyquill, and mucinex has helped   Relevant past medical, surgical, family and social history reviewed and updated as indicated. Interim medical history since our last visit reviewed. Allergies and medications reviewed and updated. Outpatient Medications Prior to Visit  Medication Sig Dispense Refill   NIFEdipine (PROCARDIA XL/NIFEDICAL XL) 60 MG 24 hr tablet Take 60 mg by mouth daily. (Patient not taking: Reported on 04/13/2021)     Prenatal Vit-Fe Fum-FA-Omega (ONE-A-DAY WOMENS PRENATAL PO) Take by mouth. (Patient not taking: Reported on 05/24/2021)     Vitamin D,  Cholecalciferol, 10 MCG (400 UNIT) CAPS Take by mouth. (Patient not taking: Reported on 05/24/2021)     No facility-administered medications prior to visit.     Per HPI unless specifically indicated in ROS section below Review of Systems  Constitutional:  Positive for appetite change, chills, fatigue and fever.  HENT:  Positive for congestion, ear pain (pressure) and sore throat. Negative for postnasal drip.   Respiratory:  Positive for cough and chest tightness. Negative for shortness of breath.   Cardiovascular:  Negative for chest pain.  Gastrointestinal:  Negative for abdominal pain, diarrhea, nausea and vomiting.  Musculoskeletal:  Positive for arthralgias and myalgias.  Neurological:  Positive for headaches.  Objective:  Temp 98.3 F (36.8 C) Comment: per patient this morning   LMP 06/03/2021    Breastfeeding No   Wt Readings from Last 3 Encounters:  05/26/21 (!) 355 lb (161 kg)  05/24/21 (!) 350 lb (158.8 kg)  04/13/21 (!) 361 lb (163.7 kg)       Physical exam: Gen: alert, NAD, not ill appearing Pulm: speaks in complete sentences without increased work of breathing Psych: normal mood, normal thought content      Results for orders placed or performed in visit on 04/13/21  CBC  Result Value Ref Range   WBC 7.8 4.0 - 10.5 K/uL   RBC 4.69 3.87 - 5.11 Mil/uL   Platelets 300.0 150.0 - 400.0 K/uL   Hemoglobin 12.4 12.0 - 15.0 g/dL   HCT 37.8 36.0 - 46.0 %   MCV 80.5 78.0 - 100.0 fl   MCHC 32.9 30.0 - 36.0 g/dL   RDW 15.6 (H) 11.5 - 15.5 %  Comprehensive metabolic panel  Result Value Ref Range   Sodium 140 135 - 145 mEq/L   Potassium 4.1 3.5 - 5.1 mEq/L   Chloride 103 96 - 112 mEq/L   CO2 30 19 - 32 mEq/L   Glucose, Bld 89 70 - 99 mg/dL   BUN 9 6 - 23 mg/dL   Creatinine, Ser 0.87 0.40 - 1.20 mg/dL   Total Bilirubin 0.5 0.2 - 1.2 mg/dL   Alkaline Phosphatase 57 39 - 117 U/L   AST 30 0 - 37 U/L   ALT 51 (H) 0 - 35 U/L   Total Protein 6.7 6.0 - 8.3 g/dL   Albumin  4.2 3.5 - 5.2 g/dL   GFR 87.16 >60.00 mL/min   Calcium 9.4 8.4 - 10.5 mg/dL  Hemoglobin A1c  Result Value Ref Range   Hgb A1c MFr Bld 5.2 4.6 - 6.5 %  Lipid panel  Result Value Ref Range   Cholesterol 168 0 - 200 mg/dL   Triglycerides 177.0 (H) 0.0 - 149.0 mg/dL   HDL 41.10 >39.00 mg/dL   VLDL 35.4 0.0 - 40.0 mg/dL   LDL Cholesterol 92 0 - 99 mg/dL   Total CHOL/HDL Ratio 4    NonHDL 127.03   VITAMIN D 25 Hydroxy (Vit-D Deficiency, Fractures)  Result Value Ref Range   VITD 28.22 (L) 30.00 - 100.00 ng/mL   Assessment & Plan:   Problem List Items Addressed This Visit       Respiratory   Viral upper respiratory tract infection    Patient tested negative for COVID, flu, and strep.  Did review test results with patient over the phone we will send in some Flonase for the ear pressure and discomfort.  Did discuss over-the-counter treatments to use for symptomatic management.  ED precautions reviewed      Other Visit Diagnoses     Fever and chills    -  Primary   Relevant Orders   POC COVID-19 (Completed)   Influenza A/B (Completed)   Other headache syndrome       Relevant Orders   POC COVID-19 (Completed)   Influenza A/B (Completed)   Acute cough       Relevant Orders   POC COVID-19 (Completed)   Influenza A/B (Completed)   Body aches       Relevant Orders   POC COVID-19 (Completed)   Influenza A/B (Completed)   Sore throat       Relevant Orders   POC COVID-19 (Completed)   Influenza A/B (Completed)   Rapid Strep A (Completed)   Pressure sensation in ear, unspecified laterality       Relevant Medications   fluticasone (FLONASE) 50 MCG/ACT nasal spray        No orders of the defined types were placed in this encounter.  No orders of the defined types were placed in this encounter.   I discussed the assessment and treatment plan with the patient. The patient was provided an opportunity to ask questions and all were answered. The patient agreed with the plan and  demonstrated an understanding of the instructions. The patient was advised to call back or seek an in-person evaluation if the symptoms worsen or if the condition fails to improve as anticipated.  Follow up plan: No follow-ups on file.  Romilda Garret, NP

## 2021-06-29 IMAGING — US US MFM OB FOLLOW-UP
1 series · 13 of 28 positions shown · non-contrast
Comparison: none

[Series 1: us mfm ob follow-up · 113 acquisitions, 13 frames shown]
[im 5/113]
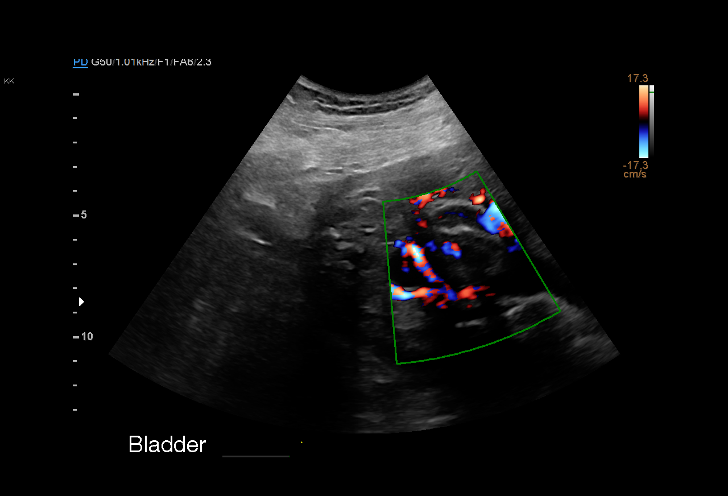
[im 13/113]
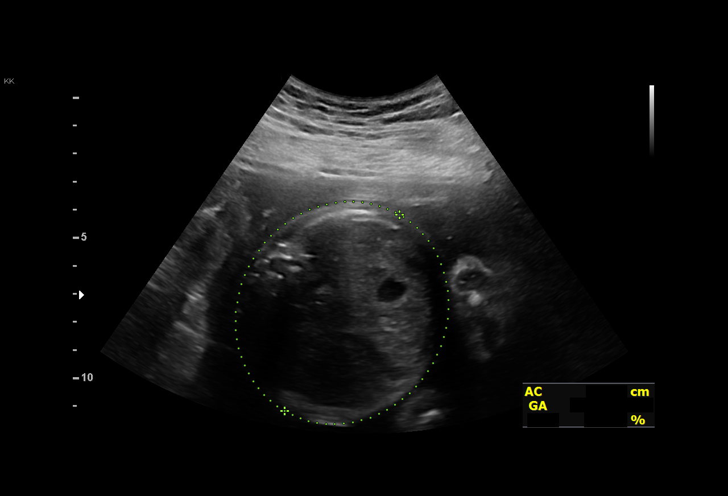
[im 21/113]
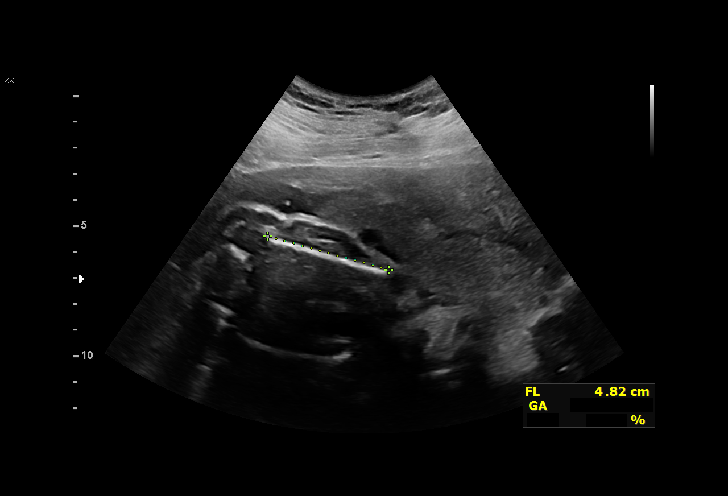
[im 30/113]
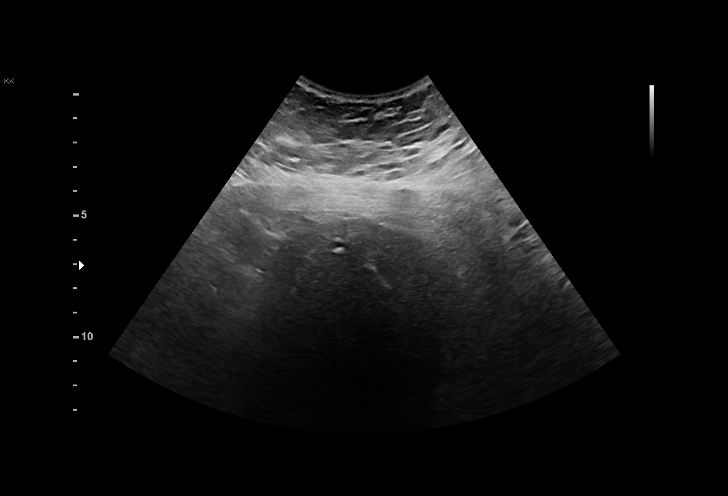
[im 38/113]
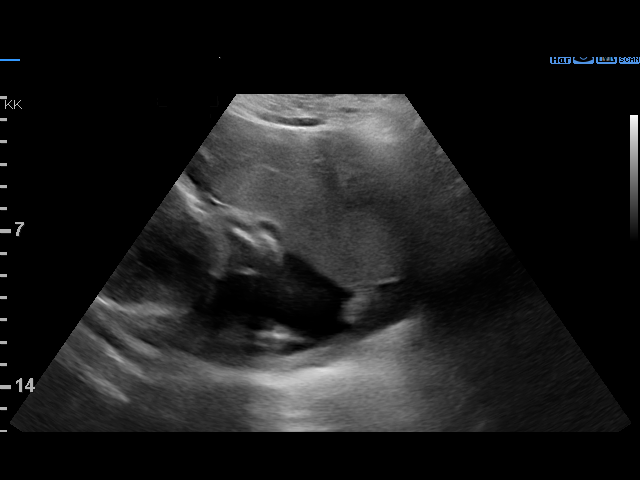
[im 46/113]
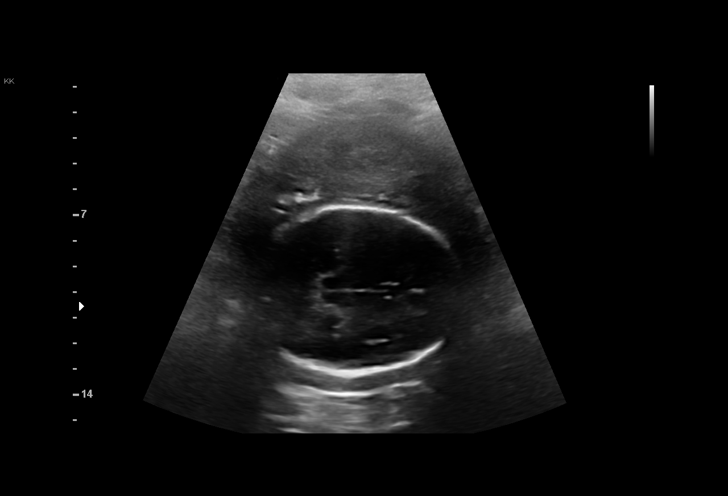
[im 59/113]
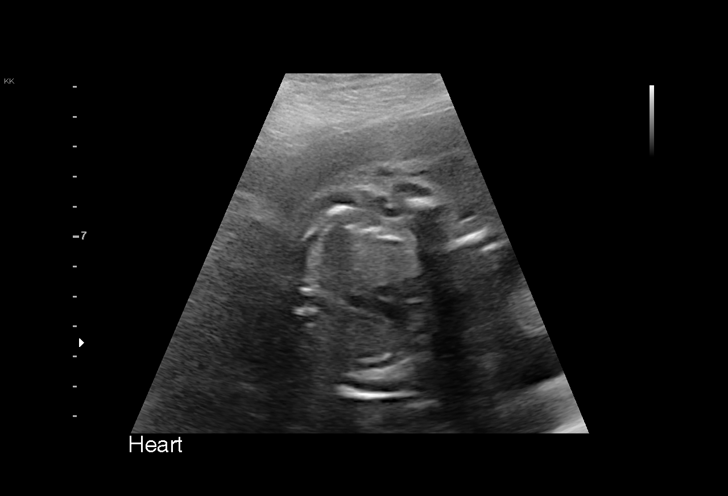
[im 67/113]
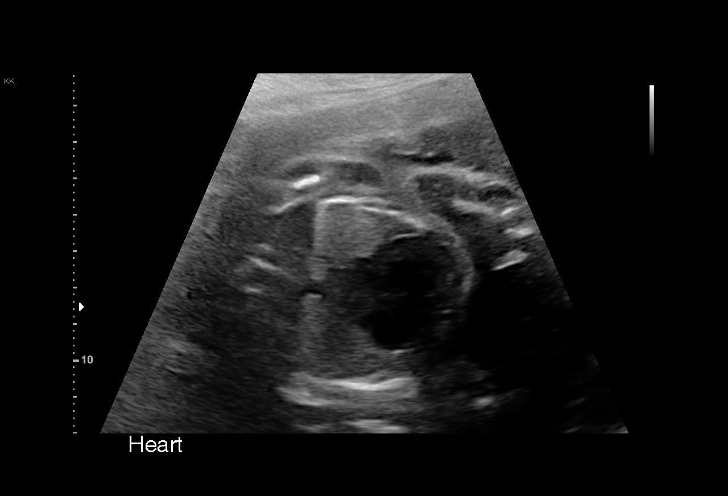
[im 75/113]
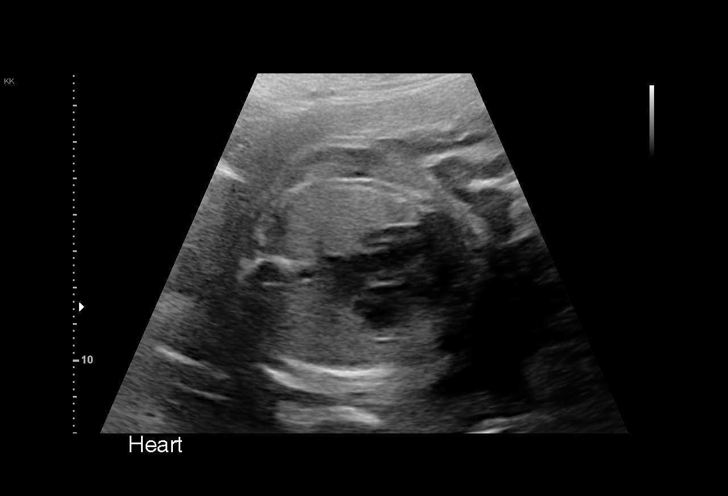
[im 83/113]
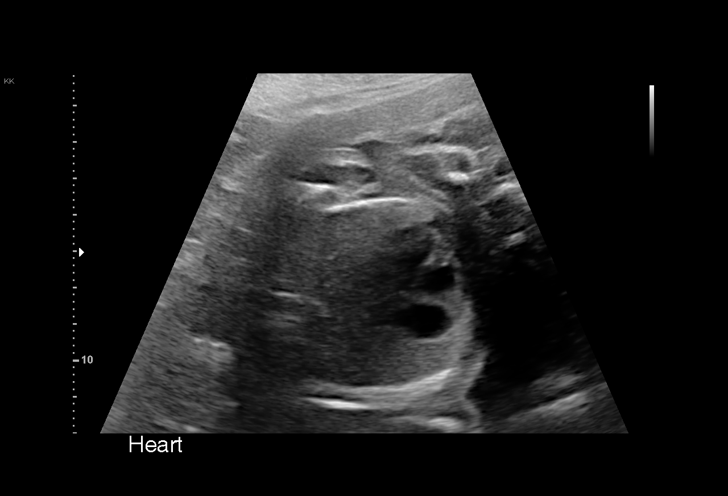
[im 92/113]
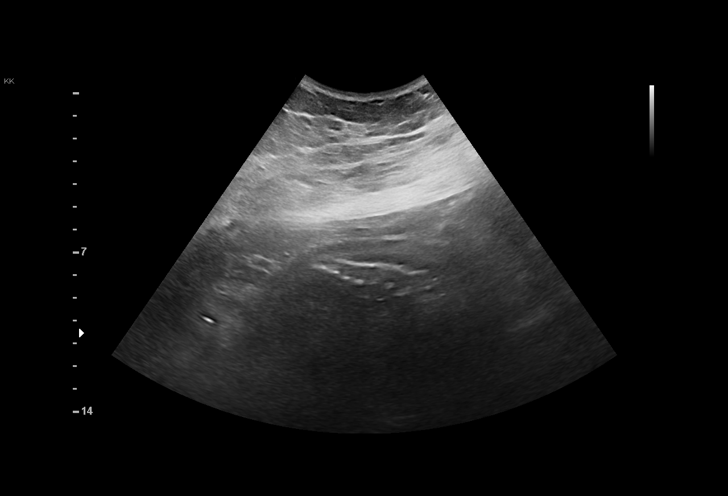
[im 100/113]
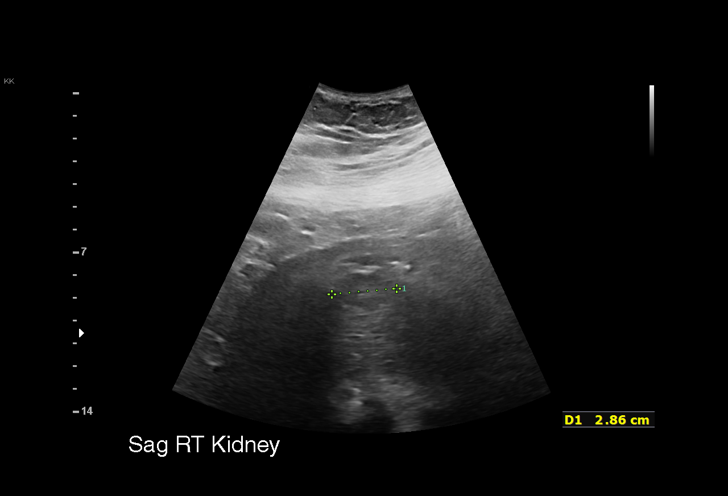
[im 108/113]
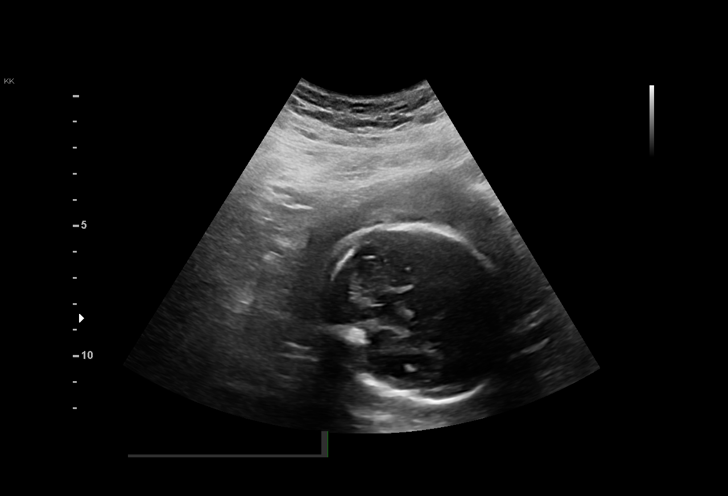

[13 of 28 positions shown; findings below may reference images not displayed]

[HOSPITAL],
                                                            Inc.

                                                      [facility]

Indications

 27 weeks gestation of pregnancy
 Obesity complicating pregnancy, second
 trimester (BMI 60)
 Hypertension - Gestational
 Poor obstetric history: Previous
 preeclampsia / eclampsia/gestational HTN
 Medical complication of pregnancy
 (Cholestatis, nonalcoholic steatohepatitiis,
 Acute fatty liver, elevated LFTs)
 Poor obstetric history: Previous neonatal
 death
 Antenatal follow-up for nonvisualized fetal
 anatomy
 Negative Mat 21
Fetal Evaluation

 Num Of Fetuses:         1
 Fetal Heart Rate(bpm):  136
 Cardiac Activity:       Observed
 Presentation:           Cephalic
 Placenta:               Anterior
 P. Cord Insertion:      Previously Visualized
 Amniotic Fluid
 AFI FV:      Within normal limits

                             Largest Pocket(cm)

Biometry

 BPD:      65.7  mm     G. Age:  26w 4d         23  %    CI:        77.06   %    70 - 86
                                                         FL/HC:      21.3   %    18.6 -
 HC:       237   mm     G. Age:  25w 5d        2.7  %    HC/AC:      0.97        1.05 -
 AC:      243.8  mm     G. Age:  28w 4d         87  %    FL/BPD:     76.9   %    71 - 87
 FL:       50.5  mm     G. Age:  27w 1d         38  %    FL/AC:      20.7   %    20 - 24

 Est. FW:    9999  gm      2 lb 7 oz     66  %
OB History

 Gravidity:    3
 Living:       1
Gestational Age

 LMP:           29w 5d        Date:  05/09/20                 EDD:   02/13/21
 U/S Today:     27w 0d                                        EDD:   03/04/21
 Best:          27w 0d     Det. By:  Early Ultrasound         EDD:   03/04/21
                                     (10/14/20)
Anatomy

 Cranium:               Appears normal         Aortic Arch:            Not well visualized
 Cavum:                 Appears normal         Ductal Arch:            Not well visualized
 Ventricles:            Appears normal         Diaphragm:              Appears normal
 Choroid Plexus:        Previously seen        Stomach:                Appears normal, left
                                                                       sided
 Cerebellum:            Appears normal         Abdomen:                Appears normal
 Posterior Fossa:       Appears normal         Abdominal Wall:         Appears nml (cord
                                                                       insert, abd wall)
 Nuchal Fold:           Not applicable (>20    Cord Vessels:           Appears normal (3
                        wks GA)                                        vessel cord)
 Face:                  Orbits and profile     Kidneys:                Appear normal
                        previously seen
 Lips:                  Previously seen        Bladder:                Appears normal
 Thoracic:              Appears normal         Spine:                  Limited views
                                                                       appear normal
 Heart:                 Appears normal         Upper Extremities:      Visualized prev
                        (4CH, axis, and
                        situs)
 RVOT:                  Previously seen        Lower Extremities:      Visualized prev
 LVOT:                  Previously seen

 Other:  Technically difficult due to maternal habitus and fetal position. Fetus
         appears to be female.
Cervix Uterus Adnexa
 Cervix
 Not visualized (advanced GA >00wks)
Impression

 History of cholestasis in the first pregnancy.  History of
 gestational hypertension.  In the current pregnancy she does
 not have cholestasis and her blood pressures have been
 within normal range.  Liver enzymes are not increased.

 BP at our office: 143/81 mm Hg .
 Patient returned for completion of fetal anatomy .Amniotic
 fluid is normal and good fetal activity is seen .Fetal biometry
 is consistent with her previously-established dates .Fetal
 anatomical survey was completed (except arches) and
 appears normal.
Recommendations

 -Fetal growth assessments every 4 weeks (patient prefers to
 have ultrasound at your office). We have not made any
 appointments.
                 Ivonne, Leonardt

## 2022-01-18 ENCOUNTER — Ambulatory Visit (INDEPENDENT_AMBULATORY_CARE_PROVIDER_SITE_OTHER): Payer: BC Managed Care – PPO | Admitting: Family Medicine

## 2022-01-18 ENCOUNTER — Encounter: Payer: Self-pay | Admitting: Family Medicine

## 2022-01-18 VITALS — BP 130/86 | HR 75 | Temp 97.9°F | Ht 64.0 in | Wt 372.0 lb

## 2022-01-18 DIAGNOSIS — F40243 Fear of flying: Secondary | ICD-10-CM | POA: Insufficient documentation

## 2022-01-18 DIAGNOSIS — Z0000 Encounter for general adult medical examination without abnormal findings: Secondary | ICD-10-CM

## 2022-01-18 DIAGNOSIS — E781 Pure hyperglyceridemia: Secondary | ICD-10-CM

## 2022-01-18 DIAGNOSIS — Z6841 Body Mass Index (BMI) 40.0 and over, adult: Secondary | ICD-10-CM | POA: Diagnosis not present

## 2022-01-18 DIAGNOSIS — F419 Anxiety disorder, unspecified: Secondary | ICD-10-CM

## 2022-01-18 DIAGNOSIS — Z131 Encounter for screening for diabetes mellitus: Secondary | ICD-10-CM | POA: Diagnosis not present

## 2022-01-18 DIAGNOSIS — I10 Essential (primary) hypertension: Secondary | ICD-10-CM | POA: Diagnosis not present

## 2022-01-18 DIAGNOSIS — E559 Vitamin D deficiency, unspecified: Secondary | ICD-10-CM

## 2022-01-18 LAB — COMPREHENSIVE METABOLIC PANEL
ALT: 30 U/L (ref 0–35)
AST: 19 U/L (ref 0–37)
Albumin: 4 g/dL (ref 3.5–5.2)
Alkaline Phosphatase: 74 U/L (ref 39–117)
BUN: 10 mg/dL (ref 6–23)
CO2: 27 mEq/L (ref 19–32)
Calcium: 9 mg/dL (ref 8.4–10.5)
Chloride: 104 mEq/L (ref 96–112)
Creatinine, Ser: 0.76 mg/dL (ref 0.40–1.20)
GFR: 101.96 mL/min (ref >60.00)
Glucose, Bld: 87 mg/dL (ref 70–99)
Potassium: 4.4 mEq/L (ref 3.5–5.1)
Sodium: 139 mEq/L (ref 135–145)
Total Bilirubin: 0.4 mg/dL (ref 0.2–1.2)
Total Protein: 6.8 g/dL (ref 6.0–8.3)

## 2022-01-18 LAB — LIPID PANEL
Cholesterol: 164 mg/dL (ref 0–200)
HDL: 47.2 mg/dL (ref >39.00)
LDL Cholesterol: 97 mg/dL (ref 0–99)
NonHDL: 117.19
Total CHOL/HDL Ratio: 3
Triglycerides: 99 mg/dL (ref 0.0–149.0)
VLDL: 19.8 mg/dL (ref 0.0–40.0)

## 2022-01-18 LAB — CBC
HCT: 36.5 % (ref 36.0–46.0)
Hemoglobin: 11.7 g/dL — ABNORMAL LOW (ref 12.0–15.0)
MCHC: 32.1 g/dL (ref 30.0–36.0)
MCV: 74.5 fl — ABNORMAL LOW (ref 78.0–100.0)
Platelets: 322 10*3/uL (ref 150.0–400.0)
RBC: 4.91 Mil/uL (ref 3.87–5.11)
RDW: 16.4 % — ABNORMAL HIGH (ref 11.5–15.5)
WBC: 6.8 10*3/uL (ref 4.0–10.5)

## 2022-01-18 LAB — TSH: TSH: 2.98 u[IU]/mL (ref 0.35–5.50)

## 2022-01-18 LAB — VITAMIN D 25 HYDROXY (VIT D DEFICIENCY, FRACTURES): VITD: 15.77 ng/mL — ABNORMAL LOW (ref 30.00–100.00)

## 2022-01-18 LAB — HEMOGLOBIN A1C: Hgb A1c MFr Bld: 5.8 % (ref 4.6–6.5)

## 2022-01-18 MED ORDER — LORAZEPAM 0.5 MG PO TABS: ORAL_TABLET | ORAL | 0 refills | Status: DC

## 2022-01-18 MED ORDER — AMLODIPINE BESYLATE 2.5 MG PO TABS: 2.5000 mg | ORAL_TABLET | Freq: Every day | ORAL | 1 refills | Status: DC

## 2022-01-18 NOTE — Progress Notes (Signed)
Patient ID: Patricia Berry, female  DOB: 28-May-1986, 36 y.o.   MRN: [id number] Patient Care Team    Relationship Specialty Notifications Start End  Ma Hillock, DO PCP - General Family Medicine  08/01/16   Janyth Contes, MD Consulting Physician Obstetrics and Gynecology  12/17/19   Skeet Latch, MD Attending Physician Cardiology  12/17/19   Jerene Bears, MD Consulting Physician Gastroenterology  12/17/19     Chief Complaint  Patient presents with   Annual Exam    Pt is fasting    Subjective: Patricia Berry is a 36 y.o.  Female  present for CPE/new acute concerns All past medical history, surgical history, allergies, family history, immunizations, medications and social history were updated in the electronic medical record today. All recent labs, ED visits and hospitalizations within the last year were reviewed.  Health maintenance:  Colonoscopy: no fhx. Routine screen at 33 Mammogram: no fhx, routine screen at 41 Cervical cancer screening: last pap: 06/2020, GYN Immunizations: tdap 2022, Influenza UTD (encouraged yearly), COVID vaccines completed.  Infectious disease screening: HIV and Hep C completed DEXA: routine  Assistive device: None Oxygen use: None Patient has a Dental home. Hospitalizations/ED visits: Reviewed  Hypertension/morbidly obese/BMI 60-69: Patient has had increasing diastolic blood pressure above goal over the last few appointments.  She states she has noticed more headaches now.  She is agreeable to start low-dose medicine to help with her blood pressure.  Anxiety: Patient reports she has been feeling more anxiety since having children.  She states she worries all the time about things she never used to be concerned about before.  She is tearful today when explaining the things she is now worrying over.  She has a trip coming up this weekend and she is worried about flying.  She reports she has never had any concerns about flying in the  past.  This is the first time she will be away from her children.    01/18/2022    9:02 AM 04/13/2021   11:08 AM 12/17/2019    9:38 AM 12/12/2017    9:21 AM 08/01/2016    9:39 AM  Depression screen PHQ 2/9  Decreased Interest 0 0 0 2 0  Down, Depressed, Hopeless 0 0 0 3 0  PHQ - 2 Score 0 0 0 5 0  Altered sleeping    3   Tired, decreased energy    3   Change in appetite    2   Feeling bad or failure about yourself     3   Trouble concentrating    2   Moving slowly or fidgety/restless    0   Suicidal thoughts    0   PHQ-9 Score    18   Difficult doing work/chores    Not difficult at all        No data to display           Immunization History  Administered Date(s) Administered   Influenza,inj,Quad PF,6+ Mos 03/20/2017, 04/13/2021   Influenza,inj,quad, With Preservative 06/03/2020   Influenza-Unspecified 03/20/2017, 07/06/2018   PFIZER(Purple Top)SARS-COV-2 Vaccination 08/16/2019, 09/06/2019, 06/10/2020   PPD Test 06/17/2014   Tdap 08/03/2017, 11/05/2018, 12/07/2020    Past Medical History:  Diagnosis Date   Anxiety    Cholestasis    Cholestasis during pregnancy    Depression    Heart disease    Hepatic steatosis    History of cholestasis during pregnancy 12/12/2017   Delivered April 2019, fetal  demise   History of cold sores    History of fetal demise, not currently pregnant 12/12/2017   History of peripheral edema 04/25/2014   History of pre-eclampsia    Hypertension    NASH (nonalcoholic steatohepatitis)    Obesity    S/P cesarean section 01/10/2019   Status post vacuum-assisted vaginal delivery 10/23/17   fetal death   Vitamin D deficiency    No Known Allergies Past Surgical History:  Procedure Laterality Date   APPENDECTOMY     CESAREAN SECTION N/A 01/10/2019   Procedure: CESAREAN SECTION;  Surgeon: Janyth Contes, MD;  Location: [facility];  Service: Obstetrics;  Laterality: N/A;  TRacyRNFA, Heather if she can   CESAREAN SECTION N/A 02/20/2021    Procedure: CESAREAN SECTION;  Surgeon: Deliah Boston, MD;  Location: [facility]S;  Service: Obstetrics;  Laterality: N/A;  Repeat C/S increased BP   LAPAROSCOPIC APPENDECTOMY N/A 08/04/2017   Procedure: APPENDECTOMY LAPAROSCOPIC;  Surgeon: Alphonsa Overall, MD;  Location: [facility] ORS;  Service: General;  Laterality: N/A;   WISDOM TOOTH EXTRACTION     Family History  Problem Relation Age of Onset   Hyperlipidemia Mother    Hypertension Mother    Mental illness Father    Hypertension Father    Cancer Maternal Aunt        small cell   Heart disease Maternal Grandmother        heart attack   Early death Maternal Grandmother    Prostate cancer Maternal Grandfather    Pancreatic cancer Maternal Grandfather    Mental illness Paternal Grandmother    Liver disease Neg Hx    Social History   Social History Narrative   Patricia Berry lives with her husband Patricia Berry. She works FT as a Careers information officer, 8th grade. She enjoys traveling over seas.   She has a master's degree.   Drinks caffeine.    Daily vitamin use.    Wears her seatbelt. Smoke detector in the home.    Exercises 3x week.    Feels safe in her relationships     Allergies as of 01/18/2022   No Known Allergies      Medication List        Accurate as of January 18, 2022  9:42 AM. If you have any questions, ask your nurse or doctor.          STOP taking these medications    fluticasone 50 MCG/ACT nasal spray Commonly known as: FLONASE Stopped by: Howard Pouch, DO       TAKE these medications    amLODipine 2.5 MG tablet Commonly known as: NORVASC Take 1 tablet (2.5 mg total) by mouth daily. Started by: Howard Pouch, DO   LORazepam 0.5 MG tablet Commonly known as: Ativan 1 tab p.o. approximately 60 minutes prior to flight Started by: Howard Pouch, DO        All past medical history, surgical history, allergies, family history, immunizations andmedications were updated in the EMR today and reviewed under the  history and medication portions of their EMR.     No results found for this or any previous visit (from the past 2160 hour(s)).  No results found.   ROS 14 pt review of systems performed and negative (unless mentioned in an HPI)  Objective: BP 130/86   Pulse 75   Temp 97.9 F (36.6 C) (Oral)   Ht 5' 4"  (1.626 m)   Wt (!) 372 lb (168.7 kg)   LMP 01/08/2022  SpO2 98%   BMI 63.85 kg/m  Physical Exam Vitals and nursing note reviewed.  Constitutional:      General: She is not in acute distress.    Appearance: Normal appearance. She is obese. She is not ill-appearing or toxic-appearing.  HENT:     Head: Normocephalic and atraumatic.     Right Ear: Tympanic membrane, ear canal and external ear normal. There is no impacted cerumen.     Left Ear: Tympanic membrane, ear canal and external ear normal. There is no impacted cerumen.     Nose: No congestion or rhinorrhea.     Mouth/Throat:     Mouth: Mucous membranes are moist.     Pharynx: Oropharynx is clear. No oropharyngeal exudate or posterior oropharyngeal erythema.  Eyes:     General: No scleral icterus.       Right eye: No discharge.        Left eye: No discharge.     Extraocular Movements: Extraocular movements intact.     Conjunctiva/sclera: Conjunctivae normal.     Pupils: Pupils are equal, round, and reactive to light.  Cardiovascular:     Rate and Rhythm: Normal rate and regular rhythm.     Pulses: Normal pulses.     Heart sounds: Normal heart sounds. No murmur heard.    No friction rub. No gallop.  Pulmonary:     Effort: Pulmonary effort is normal. No respiratory distress.     Breath sounds: Normal breath sounds. No stridor. No wheezing, rhonchi or rales.  Chest:     Chest wall: No tenderness.  Abdominal:     General: Abdomen is flat. Bowel sounds are normal. There is no distension.     Palpations: Abdomen is soft. There is no mass.     Tenderness: There is no abdominal tenderness. There is no right CVA  tenderness, left CVA tenderness, guarding or rebound.     Hernia: No hernia is present.  Musculoskeletal:        General: No swelling, tenderness or deformity. Normal range of motion.     Cervical back: Normal range of motion and neck supple. No rigidity or tenderness.     Right lower leg: No edema.     Left lower leg: No edema.  Lymphadenopathy:     Cervical: No cervical adenopathy.  Skin:    General: Skin is warm and dry.     Coloration: Skin is not jaundiced or pale.     Findings: No bruising, erythema, lesion or rash.  Neurological:     General: No focal deficit present.     Mental Status: She is alert and oriented to person, place, and time. Mental status is at baseline.     Cranial Nerves: No cranial nerve deficit.     Sensory: No sensory deficit.     Motor: No weakness.     Coordination: Coordination normal.     Gait: Gait normal.     Deep Tendon Reflexes: Reflexes normal.  Psychiatric:        Mood and Affect: Mood normal.        Behavior: Behavior normal.        Thought Content: Thought content normal.        Judgment: Judgment normal.     No results found.  Assessment/plan: NIKIAH GOIN is a 36 y.o. female present for CPE/new acute concerns Vitamin D deficiency - VITAMIN D 25 Hydroxy (Vit-D Deficiency, Fractures) BMI 60.0-69.9, adult (HCC) - Lipid panel Hypertriglyceridemia Diet and exercise.  -  CBC - Comprehensive metabolic panel - Lipid panel - TSH  Diabetes mellitus screening - Hemoglobin A1c  HTN/morbid obesity/BMI 60-69: Elevating diastolic blood pressures with increasing headaches. Start amlodipine 2.5 mg qd Follow-up 3 months  Anxiety/flight anxiety: For her upcoming flight this weekend prescribed Ativan 0.5 mg to use before flight.  [place] controlled substance database was reviewed today. We discussed different types of medication to help with her more every day anxiousness and worries.  Briefly discussed Lexapro may be a good option  if needing in the future.  Currently she is unsure if she wants to start medication. She is open to referral to a therapist to discuss.  Referral placed for her today.  Routine general medical examination at a health care facilit Colonoscopy: no fhx. Routine screen at 45 Mammogram: no fhx, routine screen at 41 Cervical cancer screening: last pap: 06/2020, GYN Immunizations: tdap 2022, Influenza UTD (encouraged yearly), COVID vaccines completed.  Infectious disease screening: HIV and Hep C completed DEXA: routine  Patient was encouraged to exercise greater than 150 minutes a week. Patient was encouraged to choose a diet filled with fresh fruits and vegetables, and lean meats. AVS provided to patient today for education/recommendation on gender specific health and safety maintenance.   Return in about 11 weeks (around 04/05/2022) for Routine chronic condition follow-up.   Orders Placed This Encounter  Procedures   CBC   Comprehensive metabolic panel   Hemoglobin A1c   Lipid panel   TSH   VITAMIN D 25 Hydroxy (Vit-D Deficiency, Fractures)   Ambulatory referral to Psychology   Meds ordered this encounter  Medications   amLODipine (NORVASC) 2.5 MG tablet    Sig: Take 1 tablet (2.5 mg total) by mouth daily.    Dispense:  90 tablet    Refill:  1   LORazepam (ATIVAN) 0.5 MG tablet    Sig: 1 tab p.o. approximately 60 minutes prior to flight    Dispense:  10 tablet    Refill:  0   Referral Orders         Ambulatory referral to Psychology       Electronically signed by: Howard Pouch, [place]

## 2022-01-18 NOTE — Patient Instructions (Addendum)

## 2022-01-19 ENCOUNTER — Telehealth: Payer: Self-pay | Admitting: Family Medicine

## 2022-01-19 MED ORDER — VITAMIN D (ERGOCALCIFEROL) 1.25 MG (50000 UNIT) PO CAPS: 50000.0000 [IU] | ORAL_CAPSULE | ORAL | 0 refills | Status: DC

## 2022-01-19 NOTE — Telephone Encounter (Signed)
Spoke with patient regarding results/recommendations.  

## 2022-01-19 NOTE — Telephone Encounter (Signed)
Please inform patient: Liver, kidney and thyroid functions are normal. electrolytes are normal. Cholesterol panel looks excellent and is at goal. A1c/diabetes screen is a little higher than it has been in the past at 5.8.  It had been 5.2.  This is at the far end of normal range.  It is reassuring her fasting glucose was normal.  I encouraged her to increase exercise and monitor her sugar/complex carb intake.  Her vitamin D is extremely low at 15.7.  I have called in a high-dose once weekly vitamin D supplement for her to take with food for best absorption.  I would also encourage her to start vitamin D3 2000 units OTC daily.  She is just mildly anemic and it is in the pattern that appears to be consistent with possible iron deficiency.  Iron is not something we routinely check on a preventative exam but we would to further evaluate the mild anemia.   -I would encourage her to increase the iron content in her diet if possible.  And if she is not taking a multivitamin I would encourage her to start. Good plant sources of iron are lentils, chickpeas, beans, tofu, cashew nuts, pumpkin seeds, kale, dried apricots and figs, raisins, quinoa and fortified breakfast cereal.  Follow-up in 10-12 weeks on vitamin D deficiency and anemia.  We will recheck her vitamin D levels, CBC and iron panel that appointment.

## 2022-02-24 ENCOUNTER — Ambulatory Visit (INDEPENDENT_AMBULATORY_CARE_PROVIDER_SITE_OTHER): Payer: BC Managed Care – PPO | Admitting: Behavioral Health

## 2022-02-24 DIAGNOSIS — F4323 Adjustment disorder with mixed anxiety and depressed mood: Secondary | ICD-10-CM | POA: Diagnosis not present

## 2022-02-24 NOTE — Progress Notes (Signed)
                Patricia Hutching, LMFT

## 2022-02-24 NOTE — Progress Notes (Signed)
[facility] Counselor Initial Adult Exam  Name: Patricia Berry Date: 02/24/2022 MRN: [id number] DOB: [birth year] PCP: Howard Pouch A, DO  Time spent: 60 min In Person @ [facility] [place]:  Self    Paperwork requested: No   Reason for Visit /Presenting Problem: Pt is very emot'l & does not know why. She is crying a lot & needs to talk things over-she is making many crucial decisions w/her Husb re: their Family.   Mental Status Exam: Appearance:   Neat     Behavior:  Appropriate and Sharing  Motor:  Normal  Speech/Language:   Clear and Coherent and Normal Rate  Affect:  Tearful  Mood:  sad & anxious about the future of her Family  Thought process:  normal  Thought content:    WNL  Sensory/Perceptual disturbances:    WNL  Orientation:  oriented to person, place, and time/date  Attention:  Good  Concentration:  Good  Memory:  WNL  Fund of knowledge:   Good  Insight:    Good  Judgment:   Good  Impulse Control:  Good    Risk Assessment: Danger to Self:  No Self-injurious Behavior: No Danger to Others: No Duty to Warn:no Physical Aggression / Violence:No  Access to Firearms a concern: No  Gang Involvement:No  Patient / guardian was educated about steps to take if suicide or homicide risk level increases between visits: yes; Pt is aware of resources & provided w/988 Crisis number if she needs it. While future psychiatric events cannot be accurately predicted, the patient does not currently require acute inpatient psychiatric care and does not currently meet [facility] involuntary commitment criteria.  Substance Abuse History: Current substance abuse: No     Past Psychiatric History:   No previous psychological problems have been observed Outpatient Providers: Dr. Howard Pouch, DO History of Psych Hospitalization: No  Psychological Testing:  NA    Abuse History:  Victim of: No.,  NA    Report needed: No. Victim of  Neglect:No. Perpetrator of  NA   Witness / Exposure to Domestic Violence: No   Protective Services Involvement: No  Witness to [employer] Violence:  No   Family History:  Family History  Problem Relation Age of Onset   Hyperlipidemia Mother    Hypertension Mother    Mental illness Father    Hypertension Father    Cancer Maternal Aunt        small cell   Heart disease Maternal Grandmother        heart attack   Early death Maternal Grandmother    Prostate cancer Maternal Grandfather    Pancreatic cancer Maternal Grandfather    Mental illness Paternal Grandmother    Liver disease Neg Hx     Living situation: the patient lives with their family  Sexual Orientation: Straight  Relationship Status: married  Name of spouse / other: [name] If a parent, number of children / ages:1yo Dtr Warren Lacy & 72yo Son [place]: spouse friends parents  Financial Stress:  No   Income/Employment/Disability: 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 Pt & Husb do not currently attend Church. Pt sts she is a person who thinks things happen for a reason, but this was tested when she  lost her Dtr [name]; [name]'s birthdate was 10-09-2021.  Any cultural differences that may affect / interfere with treatment:  None noted  Recreation/Hobbies: reading  Stressors: Loss of her newborn full term infant several yrs ago & the fatigue, busyness, & dec-mkg needs of a Young Family  in development. Husb is seeking a new job in [place] so they can move back up [place] to both of their Families. Pt wants to make a good dec for her children & Husb is very supportive of her tension about this move.   Strengths: Supportive Relationships, Family, Friends, Conservator, museum/gallery, and  Able to Communicate Effectively  Barriers:  None noted   Legal History: Pending legal issue / charges: The patient has no significant history of legal issues. History of legal issue / charges:  NA  Medical History/Surgical History: reviewed Past Medical History:  Diagnosis Date   Anxiety    Cholestasis    Cholestasis during pregnancy    Depression    Heart disease    Hepatic steatosis    History of cholestasis during pregnancy 12/12/2017   Delivered April 2019, fetal demise   History of cold sores    History of fetal demise, not currently pregnant 12/12/2017   History of peripheral edema 04/25/2014   History of pre-eclampsia    Hypertension    NASH (nonalcoholic steatohepatitis)    Obesity    S/P cesarean section 01/10/2019   Status post vacuum-assisted vaginal delivery 10/08/2017   fetal death   Vitamin D deficiency     Past Surgical History:  Procedure Laterality Date   APPENDECTOMY     CESAREAN SECTION N/A 01/10/2019   Procedure: CESAREAN SECTION;  Surgeon: Janyth Contes, MD;  Location: [facility];  Service: Obstetrics;  Laterality: N/A;  TRacyRNFA, Heather if she can   CESAREAN SECTION N/A 02/20/2021   Procedure: CESAREAN SECTION;  Surgeon: Deliah Boston, MD;  Location: [facility];  Service: Obstetrics;  Laterality: N/A;  Repeat C/S increased BP   LAPAROSCOPIC APPENDECTOMY N/A 08/04/2017   Procedure: APPENDECTOMY LAPAROSCOPIC;  Surgeon: Alphonsa Overall, MD;  Location: [facility]S;  Service: General;  Laterality: N/A;   WISDOM TOOTH EXTRACTION      Medications: Current Outpatient Medications  Medication Sig Dispense Refill   amLODipine (NORVASC) 2.5 MG tablet Take 1 tablet (2.5 mg total) by mouth daily. 90 tablet 1   LORazepam (ATIVAN) 0.5 MG tablet 1 tab p.o. approximately 60 minutes prior to flight 10 tablet 0   Vitamin D, Ergocalciferol, (DRISDOL) 1.25 MG (50000 UNIT) CAPS capsule Take 1 capsule (50,000 Units total) by mouth every 7 (seven) days. 12 capsule 0    No current facility-administered medications for this visit.    No Known Allergies  Diagnoses:  Adjustment reaction with anxiety and depression  Plan of Care: ST: Promote full grief & mourning expression w/Pt in sessions until she feels improvement in emotionality. We will take the next few visits to do this work.  Bring any items you want to share from your Dtr [name]'s birth. Bring the Memory Box [employer] provided after her death if you wish to go through these memories in Therapy.  LT: Learn to incorporate your Dtr's memory & how to do this w/your children when the time is right. Gain coping skills for acute grief in the next few mos.   [name], LMFT

## 2022-03-16 ENCOUNTER — Ambulatory Visit: Payer: BC Managed Care – PPO | Admitting: Behavioral Health

## 2022-03-16 DIAGNOSIS — F4321 Adjustment disorder with depressed mood: Secondary | ICD-10-CM | POA: Diagnosis not present

## 2022-03-16 DIAGNOSIS — F4323 Adjustment disorder with mixed anxiety and depressed mood: Secondary | ICD-10-CM | POA: Diagnosis not present

## 2022-03-16 DIAGNOSIS — O09299 Supervision of pregnancy with other poor reproductive or obstetric history, unspecified trimester: Secondary | ICD-10-CM | POA: Diagnosis not present

## 2022-03-16 NOTE — Progress Notes (Signed)
                [name], LMFT

## 2022-03-16 NOTE — Progress Notes (Signed)
[place] Counselor/Therapist Progress Note  Patient ID: Patricia Berry, MRN: [id number],    Date: 03/16/2022  Time Spent: 60 min In Person @ [facility]   Treatment Type: Individual Therapy  Reported Symptoms: Pt has elevated anx/dep due to circumstances w/her Family that will force some life decisions she is having difficulty making. Pt is grieving the loss of her Baby [name]-life has slowed down enough that her & Husb Patricia Berry are both emot'l @ times.  Mental Status Exam: Appearance:  Neat     Behavior: Appropriate and Sharing  Motor: Normal  Speech/Language:  Clear and Coherent and Normal Rate  Affect: Tearful  Mood: sad  Thought process: normal  Thought content:   WNL  Sensory/Perceptual disturbances:   WNL  Orientation: oriented to person, place, and time/date  Attention: Good  Concentration: Good  Memory: WNL  Fund of knowledge:  Good  Insight:   Good  Judgment:  Good  Impulse Control: Good   Risk Assessment: Danger to Self:  No Self-injurious Behavior: No Danger to Others: No Duty to Warn:no Physical Aggression / Violence:No  Access to Firearms a concern: No  Gang Involvement:No   Subjective: Pt expresses her difficulty making a dec about working vs staying home w/her children. 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 She is esp'ly feeling the loss of her Baby bc she is not living. Pt is trying to navigate the grief, juggle a full life & tend to her emotional well-being as this was discouraged when she was younger.   Interventions: Narrative & Grief Therapy   Diagnosis:Adjustment reaction with anxiety and depression  Prior perinatal loss, antepartum  Grief reaction  Plan: Pt expresses being raised by a Mother who loves her dearly, but who did not encourage emotional expression. Pt will attempt to protect her emot'l life & express it  freely. She will re-narrate descriptions of herself as a sensitive & loving Mother.  Target Date: 04/13/2022  Progress: 0  Frequency: Once every 3 wks  Modality: Indiv  Pt explained she did not have enough time for herself to pull out her memory box dedicated to [employer]. She may bring Husb into a session so they can do this together.  [name], LMFT

## 2022-04-05 ENCOUNTER — Ambulatory Visit: Payer: BC Managed Care – PPO | Admitting: Family Medicine

## 2022-04-05 DIAGNOSIS — I10 Essential (primary) hypertension: Secondary | ICD-10-CM | POA: Diagnosis not present

## 2022-04-05 DIAGNOSIS — Z6841 Body Mass Index (BMI) 40.0 and over, adult: Secondary | ICD-10-CM

## 2022-04-05 DIAGNOSIS — Z23 Encounter for immunization: Secondary | ICD-10-CM

## 2022-04-05 DIAGNOSIS — Z713 Dietary counseling and surveillance: Secondary | ICD-10-CM

## 2022-04-05 DIAGNOSIS — R7309 Other abnormal glucose: Secondary | ICD-10-CM | POA: Diagnosis not present

## 2022-04-05 NOTE — Patient Instructions (Signed)
Fasting labs in 1-2 weeks, with provider appt 10 days after.

## 2022-04-05 NOTE — Progress Notes (Signed)
Patient ID: Patricia Berry, female  DOB: 1985-08-26, 36 y.o.   MRN: [id number] Patient Care Team    Relationship Specialty Notifications Start End  Ma Hillock, DO PCP - General Family Medicine  08/01/16   Janyth Contes, MD Consulting Physician Obstetrics and Gynecology  12/17/19   Skeet Latch, MD Attending Physician Cardiology  12/17/19   Jerene Bears, MD Consulting Physician Gastroenterology  12/17/19     Chief Complaint  Patient presents with   Hypertension    Subjective: Patricia Berry is a 36 y.o.  Female  present for [facility] All past medical history, surgical history, allergies, family history, immunizations, medications and social history were updated in the electronic medical record today. All recent labs, ED visits and hospitalizations within the last year were reviewed.  Hypertension/morbidly obese/BMI 60-69: Pt reports never started the amlodipine 2.5 mg daily.Patient denies chest pain, shortness of breath or lower extremity edema. Patient had been started her last visit secondary to elevated pressures outside the office setting and increase in headaches.    Today patient would like to discuss weight loss management. Body mass index is 65.57 kg/m. Weight 382 pounds      01/18/2022    9:02 AM 04/13/2021   11:08 AM 12/17/2019    9:38 AM 12/12/2017    9:21 AM 08/01/2016    9:39 AM  Depression screen PHQ 2/9  Decreased Interest 0 0 0 2 0  Down, Depressed, Hopeless 0 0 0 3 0  PHQ - 2 Score 0 0 0 5 0  Altered sleeping    3   Tired, decreased energy    3   Change in appetite    2   Feeling bad or failure about yourself     3   Trouble concentrating    2   Moving slowly or fidgety/restless    0   Suicidal thoughts    0   PHQ-9 Score    18   Difficult doing work/chores    Not difficult at all        No data to display           Immunization History  Administered Date(s) Administered   Influenza,inj,Quad PF,6+ Mos 03/20/2017, 04/13/2021    Influenza,inj,quad, With Preservative 06/03/2020   Influenza-Unspecified 03/20/2017, 07/06/2018   PFIZER(Purple Top)SARS-COV-2 Vaccination 08/16/2019, 09/06/2019, 06/10/2020   PPD Test 06/17/2014   Tdap 08/03/2017, 11/05/2018, 12/07/2020    Past Medical History:  Diagnosis Date   Anxiety    Cholestasis    Cholestasis during pregnancy    Depression    Heart disease    Hepatic steatosis    History of cholestasis during pregnancy 12/12/2017   Delivered April 2019, fetal demise   History of cold sores    History of fetal demise, not currently pregnant 12/12/2017   History of peripheral edema 04/25/2014   History of pre-eclampsia    Hypertension    NASH (nonalcoholic steatohepatitis)    Obesity    S/P cesarean section 01/10/2019   Status post vacuum-assisted vaginal delivery Oct 31, 2017   fetal death   Vitamin D deficiency    No Known Allergies Past Surgical History:  Procedure Laterality Date   APPENDECTOMY     CESAREAN SECTION N/A 01/10/2019   Procedure: CESAREAN SECTION;  Surgeon: Janyth Contes, MD;  Location: [facility];  Service: Obstetrics;  Laterality: N/A;  TRacyRNFA, Heather if she can   CESAREAN SECTION N/A 02/20/2021   Procedure: CESAREAN SECTION;  Surgeon: Wilhelmenia Blase,  Melida Quitter, MD;  Location: [place] LD ORS;  Service: Obstetrics;  Laterality: N/A;  Repeat C/S increased BP   LAPAROSCOPIC APPENDECTOMY N/A 08/04/2017   Procedure: APPENDECTOMY LAPAROSCOPIC;  Surgeon: Alphonsa Overall, MD;  Location: [facility]S;  Service: General;  Laterality: N/A;   WISDOM TOOTH EXTRACTION     Family History  Problem Relation Age of Onset   Hyperlipidemia Mother    Hypertension Mother    Mental illness Father    Hypertension Father    Cancer Maternal Aunt        small cell   Heart disease Maternal Grandmother        heart attack   Early death Maternal Grandmother    Prostate cancer Maternal Grandfather    Pancreatic cancer Maternal Grandfather    Mental illness Paternal Grandmother     Liver disease Neg Hx    Social History   Social History Narrative   Patricia Berry lives with her husband Octavia Bruckner. She works FT as a Careers information officer, 8th grade. She enjoys traveling over seas.   She has a master's degree.   Drinks caffeine.    Daily vitamin use.    Wears her seatbelt. Smoke detector in the home.    Exercises 3x week.    Feels safe in her relationships     Allergies as of 04/05/2022   No Known Allergies      Medication List        Accurate as of April 05, 2022 11:59 PM. If you have any questions, ask your nurse or doctor.          amLODipine 2.5 MG tablet Commonly known as: NORVASC Take 1 tablet (2.5 mg total) by mouth daily.   LORazepam 0.5 MG tablet Commonly known as: Ativan 1 tab p.o. approximately 60 minutes prior to flight   Vitamin D (Ergocalciferol) 1.25 MG (50000 UNIT) Caps capsule Commonly known as: DRISDOL Take 1 capsule (50,000 Units total) by mouth every 7 (seven) days.        All past medical history, surgical history, allergies, family history, immunizations andmedications were updated in the EMR today and reviewed under the history and medication portions of their EMR.     Recent Results (from the past 2160 hour(s))  CBC     Status: Abnormal   Collection Time: 01/18/22  9:25 AM  Result Value Ref Range   WBC 6.8 4.0 - 10.5 K/uL   RBC 4.91 3.87 - 5.11 Mil/uL   Platelets 322.0 150.0 - 400.0 K/uL   Hemoglobin 11.7 (L) 12.0 - 15.0 g/dL   HCT 36.5 36.0 - 46.0 %   MCV 74.5 (L) 78.0 - 100.0 fl   MCHC 32.1 30.0 - 36.0 g/dL   RDW 16.4 (H) 11.5 - 15.5 %  Comprehensive metabolic panel     Status: None   Collection Time: 01/18/22  9:25 AM  Result Value Ref Range   Sodium 139 135 - 145 mEq/L   Potassium 4.4 3.5 - 5.1 mEq/L   Chloride 104 96 - 112 mEq/L   CO2 27 19 - 32 mEq/L   Glucose, Bld 87 70 - 99 mg/dL   BUN 10 6 - 23 mg/dL   Creatinine, Ser 0.76 0.40 - 1.20 mg/dL   Total Bilirubin 0.4 0.2 - 1.2 mg/dL   Alkaline Phosphatase 74  39 - 117 U/L   AST 19 0 - 37 U/L   ALT 30 0 - 35 U/L   Total Protein 6.8 6.0 - 8.3 g/dL  Albumin 4.0 3.5 - 5.2 g/dL   GFR 101.96 >60.00 mL/min    Comment: Calculated using the CKD-EPI Creatinine Equation (2021)   Calcium 9.0 8.4 - 10.5 mg/dL  Hemoglobin A1c     Status: None   Collection Time: 01/18/22  9:25 AM  Result Value Ref Range   Hgb A1c MFr Bld 5.8 4.6 - 6.5 %    Comment: Glycemic Control Guidelines for People with Diabetes:Non Diabetic:  <6%Goal of Therapy: <7%Additional Action Suggested:  >8%   Lipid panel     Status: None   Collection Time: 01/18/22  9:25 AM  Result Value Ref Range   Cholesterol 164 0 - 200 mg/dL    Comment: ATP III Classification       Desirable:  < 200 mg/dL               Borderline High:  200 - 239 mg/dL          High:  > = 240 mg/dL   Triglycerides 99.0 0.0 - 149.0 mg/dL    Comment: Normal:  <150 mg/dLBorderline High:  150 - 199 mg/dL   HDL 47.20 >39.00 mg/dL   VLDL 19.8 0.0 - 40.0 mg/dL   LDL Cholesterol 97 0 - 99 mg/dL   Total CHOL/HDL Ratio 3     Comment:                Men          Women1/2 Average Risk     3.4          3.3Average Risk          5.0          4.42X Average Risk          9.6          7.13X Average Risk          15.0          11.0                       NonHDL 117.19     Comment: NOTE:  Non-HDL goal should be 30 mg/dL higher than patient's LDL goal (i.e. LDL goal of < 70 mg/dL, would have non-HDL goal of < 100 mg/dL)  TSH     Status: None   Collection Time: 01/18/22  9:25 AM  Result Value Ref Range   TSH 2.98 0.35 - 5.50 uIU/mL  VITAMIN D 25 Hydroxy (Vit-D Deficiency, Fractures)     Status: Abnormal   Collection Time: 01/18/22  9:25 AM  Result Value Ref Range   VITD 15.77 (L) 30.00 - 100.00 ng/mL    No results found.   ROS 14 pt review of systems performed and negative (unless mentioned in an HPI)  Objective: BP 118/83   Pulse 98   Temp 98.9 F (37.2 C)   Wt (!) 382 lb (173.3 kg)   LMP 03/12/2022   SpO2 98%   BMI 65.57  kg/m  Physical Exam Vitals and nursing note reviewed.  Constitutional:      General: She is not in acute distress.    Appearance: Normal appearance. She is obese. She is not ill-appearing, toxic-appearing or diaphoretic.  HENT:     Head: Normocephalic and atraumatic.  Eyes:     General: No scleral icterus.       Right eye: No discharge.        Left eye: No discharge.     Extraocular Movements:  Extraocular movements intact.     Conjunctiva/sclera: Conjunctivae normal.     Pupils: Pupils are equal, round, and reactive to light.  Cardiovascular:     Rate and Rhythm: Normal rate and regular rhythm.  Pulmonary:     Effort: Pulmonary effort is normal. No respiratory distress.     Breath sounds: Normal breath sounds. No wheezing, rhonchi or rales.  Musculoskeletal:     Right lower leg: No edema.     Left lower leg: No edema.  Skin:    General: Skin is warm and dry.     Coloration: Skin is not jaundiced or pale.     Findings: No erythema or rash.  Neurological:     Mental Status: She is alert and oriented to person, place, and time. Mental status is at baseline.     Motor: No weakness.     Gait: Gait normal.  Psychiatric:        Mood and Affect: Mood normal.        Behavior: Behavior normal.        Thought Content: Thought content normal.        Judgment: Judgment normal.     No results found.  Assessment/plan: Patricia Berry is a 36 y.o. female present for  HTN/morbid obesity/BMI 60-69: Elected not to start the amlodipine.  Headaches have improved on their own.  Blood pressure diastolics are still mildly elevated but systolics are much improved. Okay to hold for now.   Weight loss counseling/morbid obesity: Briefly discussed weight loss counseling, dietary changes, exercise regimens, medications. Encouraged patient to have fasting lab appointment 1 to 2 weeks with a provider appointment shortly thereafter scheduled. In the meantime she will start her food log diary for  1 to 2 weeks and bring with her to her appointment for review.  Return in about 16 days (around 04/21/2022) for FASTING lab appt and provider appt about 10 days after.   Orders Placed This Encounter  Procedures   Cardio IQ Insulin Resistance Panel with Score   Hemoglobin A1c   No orders of the defined types were placed in this encounter.  Referral Orders  No referral(s) requested today      Electronically signed by: Howard Pouch, [place]

## 2022-04-12 ENCOUNTER — Other Ambulatory Visit: Payer: Self-pay

## 2022-04-12 MED ORDER — VITAMIN D (ERGOCALCIFEROL) 1.25 MG (50000 UNIT) PO CAPS: 50000.0000 [IU] | ORAL_CAPSULE | ORAL | 0 refills | Status: DC

## 2022-04-12 NOTE — Telephone Encounter (Signed)
Please advise if refill is appropriate.  Per 08/02 not: Follow-up in 10-12 weeks on vitamin D deficiency and anemia.  We will recheck her vitamin D levels, CBC and iron panel that appointment.  Labs were not rechecked

## 2022-04-13 ENCOUNTER — Ambulatory Visit: Payer: BC Managed Care – PPO | Admitting: Behavioral Health

## 2022-04-13 DIAGNOSIS — O09299 Supervision of pregnancy with other poor reproductive or obstetric history, unspecified trimester: Secondary | ICD-10-CM | POA: Diagnosis not present

## 2022-04-13 DIAGNOSIS — F4323 Adjustment disorder with mixed anxiety and depressed mood: Secondary | ICD-10-CM | POA: Diagnosis not present

## 2022-04-13 DIAGNOSIS — F4321 Adjustment disorder with depressed mood: Secondary | ICD-10-CM | POA: Diagnosis not present

## 2022-04-13 NOTE — Progress Notes (Unsigned)
                [name], LMFT

## 2022-04-14 NOTE — Progress Notes (Signed)
[place] Counselor/Therapist Progress Note  Patient ID: Patricia Berry, MRN: [id number],    Date: 04/14/2022  Time Spent: 60 min In Person @ [facility]   Treatment Type: Individual Therapy  Reported Symptoms: Pt is dep'd, saddened & tearful today as she brings the memory box to psychotherapy that holds her Dtr Patricia Berry's remembrances.  Mental Status Exam: Appearance:  Neat     Behavior: Appropriate and Sharing  Motor: Normal  Speech/Language:  Clear and Coherent  Affect: Appropriate and congruent w/mood  Mood: sad  Thought process: normal  Thought content:   WNL  Sensory/Perceptual disturbances:   WNL  Orientation: oriented to person, place, and time/date  Attention: Good  Concentration: Good  Memory: WNL  Fund of knowledge:  Good  Insight:   Good  Judgment:  Good  Impulse Control: Good   Risk Assessment: Danger to Self:  No Self-injurious Behavior: No Danger to Others: No Duty to Warn:no Physical Aggression / Violence:No  Access to Firearms a concern: No  Gang Involvement:No   Subjective: Pt is upset & tearful during session today as she shares the items in her Dtr's memory box. She expressesd her memories surrounding the time of her Dtr's death in the NICU.  Pt is is distress over her & Husb's decision about her desire to stay home & be w/her 2 children while they are young. Son Patricia Berry is 52yo & Dtr Patricia Berry is 35yo. They both attend a Private Preschool & Pt is overwhelmed how she & Husb should proceed w/this crucial decision. Husb has told her they will work it out, & Pt is sure this cannot be feasible.   Interventions: Grief Therapy  Diagnosis:Adjustment reaction with anxiety and depression  Prior perinatal loss, antepartum  Grief reaction  Plan: Patricia Berry is glad she shared the items in her Dtr's box. It is reminding her there are pics on a disc & her Mother has asked for a pic of Patricia Berry when she is ready. Pt will view the pictures w/her  Husb Patricia Berry & they will print the ones they are comfortable sharing w/others.  Target Date: 05/14/2022  Progress: 0  Frequency: Twice monthly  Modality: [name] hopes she can speak about her Dtr in front of friends in the future w/o crying uncontrollably. Pt will speak w/her Husb more freq'ly about their Dtr in order to inc her comfort level in the presence of others. As they share tgthr she may feel she has inc'd control over her emot's. She may also come to accept she will always express deep emot over her Dtr's loss & not worry about tears in front of others.  Target Date: 05/14/2022  Progress: 2  Frequency: Twice monthly  Modality: Indiv & Cpl when indicated  [name], LMFT

## 2022-04-21 ENCOUNTER — Other Ambulatory Visit: Payer: BC Managed Care – PPO

## 2022-04-21 DIAGNOSIS — R7309 Other abnormal glucose: Secondary | ICD-10-CM

## 2022-04-21 DIAGNOSIS — I10 Essential (primary) hypertension: Secondary | ICD-10-CM

## 2022-04-21 DIAGNOSIS — Z6841 Body Mass Index (BMI) 40.0 and over, adult: Secondary | ICD-10-CM

## 2022-04-28 ENCOUNTER — Telehealth: Payer: Self-pay | Admitting: Family Medicine

## 2022-04-28 LAB — CARDIO IQ INSULIN RESISTANCE PANEL WITH SCORE
C-PEPTIDE, LC/MS/MS: 3.49 ng/mL — ABNORMAL HIGH (ref 0.68–2.16)
INSULIN, INTACT, LC/MS/MS: 45 u[IU]/mL — ABNORMAL HIGH (ref <=16)
Insulin Resistance Score: 100 — ABNORMAL HIGH (ref <=66)

## 2022-04-28 LAB — HEMOGLOBIN A1C
Hgb A1c MFr Bld: 5.8 % of total Hgb — ABNORMAL HIGH (ref <5.7)
Mean Plasma Glucose: 120 mg/dL
eAG (mmol/L): 6.6 mmol/L

## 2022-04-28 NOTE — Telephone Encounter (Signed)
LM for pt to return call to discuss.  

## 2022-04-28 NOTE — Telephone Encounter (Signed)
Patient has an appointment coming up next week to discuss in detail.  However I do want her to know that she is extremely insulin resistant.  With a score of 100, which is the highest score possible for insulin resistance on this particular test.

## 2022-04-29 NOTE — Telephone Encounter (Signed)
Spoke with patient regarding results/recommendations.  

## 2022-05-02 ENCOUNTER — Encounter: Payer: Self-pay | Admitting: Family Medicine

## 2022-05-02 ENCOUNTER — Ambulatory Visit: Payer: BC Managed Care – PPO | Admitting: Family Medicine

## 2022-05-02 VITALS — BP 131/84 | HR 95 | Temp 98.7°F | Wt 385.2 lb

## 2022-05-02 DIAGNOSIS — Z23 Encounter for immunization: Secondary | ICD-10-CM | POA: Diagnosis not present

## 2022-05-02 DIAGNOSIS — Z713 Dietary counseling and surveillance: Secondary | ICD-10-CM | POA: Diagnosis not present

## 2022-05-02 DIAGNOSIS — Z6841 Body Mass Index (BMI) 40.0 and over, adult: Secondary | ICD-10-CM | POA: Diagnosis not present

## 2022-05-02 DIAGNOSIS — R7303 Prediabetes: Secondary | ICD-10-CM | POA: Diagnosis not present

## 2022-05-02 DIAGNOSIS — E88819 Insulin resistance, unspecified: Secondary | ICD-10-CM

## 2022-05-02 NOTE — Progress Notes (Unsigned)
Patricia Berry , 05/16/1986, 36 y.o., female MRN: [id number] Patient Care Team    Relationship Specialty Notifications Start End  Ma Hillock, DO PCP - General Family Medicine  08/01/16   Janyth Contes, MD Consulting Physician Obstetrics and Gynecology  12/17/19   Skeet Latch, MD Attending Physician Cardiology  12/17/19   Jerene Bears, MD Consulting Physician Gastroenterology  12/17/19     Chief Complaint  Patient presents with   Insulin resistance     Subjective: Pt presents for an OV discuss her obesity and insulin resistance. WT: 385 pounds BMI: 66.12 Patient has struggled with weight for most of her life.  She would like to discuss weight loss program to help her get on track to losing weight.  We did perform an insulin resistance score prior to appointment today and her scores 100.  C-peptide elevated at 3.49 and insulin elevated at 45..  A1c was 5.8-prediabetic.  Does not exercise routinely.  She does not follow a particular diet.  She does have 2 young children she cares for. She states she believes breakfast is probably her worst meal of the day because she is grabbing something quick and usually consists of a heavy carb such as a muffin or bagel.  She states sometimes she will grab a banana. At lunch, she usually snacks more so than eats a meal.  She will have carrots and hummus, crackers or popcorn.  For dinner her husband usually cooks so they usually have some type of meat and vegetable.     01/18/2022    9:02 AM 04/13/2021   11:08 AM 12/17/2019    9:38 AM 12/12/2017    9:21 AM 08/01/2016    9:39 AM  Depression screen PHQ 2/9  Decreased Interest 0 0 0 2 0  Down, Depressed, Hopeless 0 0 0 3 0  PHQ - 2 Score 0 0 0 5 0  Altered sleeping    3   Tired, decreased energy    3   Change in appetite    2   Feeling bad or failure about yourself     3   Trouble concentrating    2   Moving slowly or fidgety/restless    0   Suicidal thoughts    0   PHQ-9  Score    18   Difficult doing work/chores    Not difficult at all     No Known Allergies Social History   Social History Narrative   [name] lives with her husband Octavia Bruckner. She works FT as a Careers information officer, 8th grade. She enjoys traveling over seas.   She has a master's degree.   Drinks caffeine.    Daily vitamin use.    Wears her seatbelt. Smoke detector in the home.    Exercises 3x week.    Feels safe in her relationships    Past Medical History:  Diagnosis Date   Anxiety    Cholestasis    Cholestasis during pregnancy    Depression    Heart disease    Hepatic steatosis    History of cholestasis during pregnancy 12/12/2017   Delivered April 2019, fetal demise   History of cold sores    History of fetal demise, not currently pregnant 12/12/2017   History of peripheral edema 04/25/2014   History of pre-eclampsia    Hypertension    NASH (nonalcoholic steatohepatitis)    Obesity    S/P cesarean section 01/10/2019  Status post vacuum-assisted vaginal delivery 2017-10-11   fetal death   Vitamin D deficiency    Past Surgical History:  Procedure Laterality Date   APPENDECTOMY     CESAREAN SECTION N/A 01/10/2019   Procedure: CESAREAN SECTION;  Surgeon: Janyth Contes, MD;  Location: [facility];  Service: Obstetrics;  Laterality: N/A;  TRacyRNFA, Heather if she can   CESAREAN SECTION N/A 02/20/2021   Procedure: CESAREAN SECTION;  Surgeon: Deliah Boston, MD;  Location: [facility];  Service: Obstetrics;  Laterality: N/A;  Repeat C/S increased BP   LAPAROSCOPIC APPENDECTOMY N/A 08/04/2017   Procedure: APPENDECTOMY LAPAROSCOPIC;  Surgeon: Alphonsa Overall, MD;  Location: [facility];  Service: General;  Laterality: N/A;   WISDOM TOOTH EXTRACTION     Family History  Problem Relation Age of Onset   Hyperlipidemia Mother    Hypertension Mother    Mental illness Father    Hypertension Father    Cancer Maternal Aunt        small cell   Heart disease Maternal Grandmother         heart attack   Early death Maternal Grandmother    Prostate cancer Maternal Grandfather    Pancreatic cancer Maternal Grandfather    Mental illness Paternal Grandmother    Liver disease Neg Hx    Allergies as of 05/02/2022   No Known Allergies      Medication List        Accurate as of May 02, 2022 11:59 PM. If you have any questions, ask your nurse or doctor.          STOP taking these medications    amLODipine 2.5 MG tablet Commonly known as: NORVASC Stopped by: Howard Pouch, DO   LORazepam 0.5 MG tablet Commonly known as: Ativan Stopped by: Howard Pouch, DO       TAKE these medications    Semaglutide(0.25 or 0.5MG/DOS) 2 MG/3ML Sopn Inject 0.5 mg into the skin once a week. Started by: Howard Pouch, DO   Vitamin D (Ergocalciferol) 1.25 MG (50000 UNIT) Caps capsule Commonly known as: DRISDOL Take 1 capsule (50,000 Units total) by mouth every 7 (seven) days.        All past medical history, surgical history, allergies, family history, immunizations andmedications were updated in the EMR today and reviewed under the history and medication portions of their EMR.     ROS Negative, with the exception of above mentioned in HPI   Objective:  BP 131/84   Pulse 95   Temp 98.7 F (37.1 C)   Wt (!) 385 lb 3.2 oz (174.7 kg)   LMP 03/12/2022   SpO2 99%   BMI 66.12 kg/m  Body mass index is 66.12 kg/m. Physical Exam Vitals and nursing note reviewed.  Constitutional:      General: She is not in acute distress.    Appearance: Normal appearance. She is obese. She is not ill-appearing or toxic-appearing.  Eyes:     Extraocular Movements: Extraocular movements intact.     Conjunctiva/sclera: Conjunctivae normal.     Pupils: Pupils are equal, round, and reactive to light.  Neurological:     Mental Status: She is alert and oriented to person, place, and time. Mental status is at baseline.  Psychiatric:        Mood and Affect: Mood normal.         Behavior: Behavior normal.        Thought Content: Thought content normal.  Judgment: Judgment normal.      No results found. No results found. No results found for this or any previous visit (from the past 24 hour(s)).  Assessment/Plan: Patricia Berry is a 36 y.o. female present for OV for  Insulin resistance/Class 3 severe obesity with serious comorbidity and body mass index (BMI) of 60.0 to 69.9 in adult, unspecified obesity type (HCC)/ Morbid obesity (HCC)/Weight loss counseling, encounter for/Prediabetes Start weight/BMI: 385/66.12 Patient was counseled on exercise, calorie counting, weight loss and potential medications to help with weight loss today. -Patient was provided with online resources for: Weekly net calorie calculator.  Applications for calorie counting.  Patient was advised to ensure she is taking in adequate nutrition daily by meeting calorie goals. -Patient was educated on dietary changes to not only lose weight but to eat healthy.  Patient was educated on glycemic index. -Patient was educated on exercise goal of 150 minutes a week (plus warm up and cool down) of cardiovascular exercise.  Patient was educated on heart rate for cardiovascular and fat burning zones. -Patient was encouraged to maintain adequate water consumption of at least 120 ounces a day, more if exercising/sweating. She was provided with examples of easier breakfast is to take on the go that are less carbohydrate rich, as well as other meals. We discussed Ozempic can be helpful in prediabetes/diabetes/insulin resistance. Follow-up in 4-6 weeks  Influenza vaccine administered today Reviewed expectations re: course of current medical issues. Discussed self-management of symptoms. Outlined signs and symptoms indicating need for more acute intervention. Patient verbalized understanding and all questions were answered. Patient received an After-Visit Summary.    Orders Placed This Encounter   Procedures   Flu Vaccine QUAD 52moIM (Fluarix, Fluzone & Alfiuria Quad PF)   Meds ordered this encounter  Medications   Semaglutide,0.25 or 0.5MG/DOS, 2 MG/3ML SOPN    Sig: Inject 0.5 mg into the skin once a week.    Dispense:  3 mL    Refill:  0   Referral Orders  No referral(s) requested today     Note is dictated utilizing voice recognition software. Although note has been proof read prior to signing, occasional typographical errors still can be missed. If any questions arise, please do not hesitate to call for verification.   electronically signed by:  RHoward Pouch DO  [place]

## 2022-05-02 NOTE — Patient Instructions (Signed)
counseled on exercise, calorie counting, weight loss and potential medications to help with weight loss today. provided with online resources for: Weekly net calorie calculator.  Applications for calorie counting.  Patient was advised to ensure she is taking in adequate nutrition daily by meeting calorie goals. educated on dietary changes to not only lose weight but to eat healthy.  Patient was educated on glycemic index.  educated on exercise goal of 150 minutes a week (plus warm up and cool down) of cardiovascular exercise.   encouraged to maintain adequate water consumption of at least 120 ounces a day, more if exercising/sweating.

## 2022-05-03 DIAGNOSIS — E88819 Insulin resistance, unspecified: Secondary | ICD-10-CM | POA: Insufficient documentation

## 2022-05-03 MED ORDER — SEMAGLUTIDE(0.25 OR 0.5MG/DOS) 2 MG/3ML ~~LOC~~ SOPN: 0.5000 mg | PEN_INJECTOR | SUBCUTANEOUS | 0 refills | Status: DC

## 2022-05-05 ENCOUNTER — Ambulatory Visit: Payer: BC Managed Care – PPO | Admitting: Behavioral Health

## 2022-05-23 ENCOUNTER — Ambulatory Visit (INDEPENDENT_AMBULATORY_CARE_PROVIDER_SITE_OTHER): Payer: BC Managed Care – PPO | Admitting: Behavioral Health

## 2022-05-23 DIAGNOSIS — F4323 Adjustment disorder with mixed anxiety and depressed mood: Secondary | ICD-10-CM | POA: Diagnosis not present

## 2022-05-23 DIAGNOSIS — F4321 Adjustment disorder with depressed mood: Secondary | ICD-10-CM | POA: Diagnosis not present

## 2022-05-23 NOTE — Progress Notes (Signed)
[facility] Counselor/Therapist Progress Note  Patient ID: Patricia Berry, MRN: [id number],    Date: 05/23/2022  Time Spent: 55 min Caregility video; Pt is in her car in private & Provider @ Home Office remotely   Treatment Type: Individual Therapy  Reported Symptoms: elevated anx/dep due to death of Dtr 2 yrs ago  Mental Status Exam: Appearance:  Casual     Behavior: Appropriate and Sharing  Motor: Normal  Speech/Language:  Clear and Coherent  Affect: Appropriate  Mood: normal  Thought process: normal  Thought content:   WNL  Sensory/Perceptual disturbances:   WNL  Orientation: oriented to person, place, and time/date  Attention: Good  Concentration: Good  Memory: WNL  Fund of knowledge:  Good  Insight:   Good  Judgment:  Good  Impulse Control: Good   Risk Assessment: Danger to Self:  No Self-injurious Behavior: No Danger to Others: No Duty to Warn:no Physical Aggression / Violence:No  Access to Firearms a concern: No  Gang Involvement:No   Subjective: Pt is upset over the pictures taken of Dtr Patricia Berry who died shortly after birth. Pt wants to send these & gift them as needed. She would do things so differently now, looking back.   Pt is wary of the questions her children might ask about their Str. She wants to spare them the emotion that comes w/the event.    Interventions: Family Systems  Diagnosis:Adjustment reaction with anxiety and depression  Grief reaction  Plan: Patricia Berry wants to be healthier about her grieving for Patricia Berry. She will offer to her Husb Patricia Berry to grieve together @ a future session.  Target Date: 06/23/2022  Progress: 4  Frequency; Twice monthly  Modality: Indiv Patricia Berry's Mom & Patricia Berry are both coming for Christmas & the Family will be together. Patricia Berry will try to incorportate her Dtr. Patricia Berry into the [place] season.   Target Date: 06/23/2022  Progress: 2  Frequency: Twice monthly  Modality: Patricia Reaper,  LMFT

## 2022-05-23 NOTE — Progress Notes (Signed)
                Patricia Hutching, LMFT

## 2022-05-24 ENCOUNTER — Telehealth: Payer: Self-pay

## 2022-05-24 MED ORDER — VALACYCLOVIR HCL 1 G PO TABS: ORAL_TABLET | ORAL | 2 refills | Status: DC

## 2022-05-24 NOTE — Telephone Encounter (Signed)
Please advise on Valtrex refill. Has not been on med list since 2021.

## 2022-05-24 NOTE — Telephone Encounter (Signed)
sent 

## 2022-05-24 NOTE — Addendum Note (Signed)
Addended by: Howard Pouch A on: 05/24/2022 12:36 PM   Modules accepted: Orders

## 2022-05-24 NOTE — Telephone Encounter (Signed)
[facility] - Client TELEPHONE ADVICE RECORD [facility] Patient Name: Patricia Berry MNETT Gender: Female DOB: 11/01/1985 Age: 36 Y 1 M 20 D Return Phone Number: [phone] (Primary), [phone] (Secondary) Address: City/ State/ Zip: [place]  [address] Client [facility] - Client Client Site [facility] - Day Provider Raoul Pitch, [place] Contact Type Call Who Is Calling Patient / Member / Family / Caregiver Call Type Triage / Clinical Relationship To Patient Self Return Phone Number [phone] (Primary) Chief Complaint Cold Sores Reason for Call Medication Question / Request Initial Comment Caller states that she needs a prescription for a cold sore. She needs to get a new prescription. Her sores are on the top and bottom of her lips, and it has worsened since this morning. Translation No Nurse Assessment Nurse: Gloriann Loan, RN, Sharyn Lull Date/Time (Eastern Time): 05/21/2022 4:03:00 PM Confirm and document reason for call. If symptomatic, describe symptoms. ---Caller states that she needs a prescription for a cold sore. She needs to get a new prescription. Her sores are on the top and bottom of her lips, and it has worsened since this morning. Does the patient have any new or worsening symptoms? ---Yes Will a triage be completed? ---Yes Related visit to physician within the last 2 weeks? ---N/A Does the PT have any chronic conditions? (i.e. diabetes, asthma, this includes High risk factors for pregnancy, etc.) ---Unknown Is the patient pregnant or possibly pregnant? (Ask all females between the ages of 60-55) ---No Is this a behavioral health or substance abuse call? ---No Disp. Time Eilene Ghazi Time) Disposition Final User 05/21/2022 4:05:19 PM Attempt made - message left Gloriann Loan, RN, Sharyn Lull 05/21/2022 4:17:31 PM FINAL ATTEMPT MADE - message left Gloriann Loan RNSharyn Lull 05/21/2022 4:17:42 PM Send to RN Final  Attempt Cay Schillings, RN, Michelle 05/22/2022 5:14:38 AM Attempt made - message left Damaris Schooner, RN, Noah Delaine 05/22/2022 5:30:25 AM FINAL ATTEMPT MADE - message left Yes Damaris Schooner, RN, Noah Delaine PLEASE NOTE: All timestamps contained within this report are represented as [place] Standard Time. CONFIDENTIALTY NOTICE: This fax transmission is intended only for the addressee. It contains information that is legally privileged, confidential or otherwise protected from use or disclosure. If you are not the intended recipient, you are strictly prohibited from reviewing, disclosing, copying using or disseminating any of this information or taking any action in reliance on or regarding this information. If you have received this fax in error, please notify us immediately by telephone so that we can arrange for its return to [place]. Phone: [phone], Toll-Free: [phone], Fax: [phone] Page: 2 of 2 Call Id: [id number] Final Disposition 05/22/2022 5:30:25 AM FINAL ATTEMPT MADE - message left

## 2022-06-06 ENCOUNTER — Ambulatory Visit: Payer: BC Managed Care – PPO | Admitting: Family Medicine

## 2022-06-06 ENCOUNTER — Encounter: Payer: Self-pay | Admitting: Family Medicine

## 2022-06-06 VITALS — BP 131/87 | HR 75 | Temp 98.1°F | Ht 64.0 in | Wt 372.0 lb

## 2022-06-06 DIAGNOSIS — R7303 Prediabetes: Secondary | ICD-10-CM | POA: Diagnosis not present

## 2022-06-06 DIAGNOSIS — E88819 Insulin resistance, unspecified: Secondary | ICD-10-CM | POA: Diagnosis not present

## 2022-06-06 DIAGNOSIS — Z8249 Family history of ischemic heart disease and other diseases of the circulatory system: Secondary | ICD-10-CM | POA: Diagnosis not present

## 2022-06-06 DIAGNOSIS — Z6841 Body Mass Index (BMI) 40.0 and over, adult: Secondary | ICD-10-CM | POA: Diagnosis not present

## 2022-06-06 DIAGNOSIS — Z713 Dietary counseling and surveillance: Secondary | ICD-10-CM

## 2022-06-06 MED ORDER — SEMAGLUTIDE (1 MG/DOSE) 4 MG/3ML ~~LOC~~ SOPN: 1.0000 mg | PEN_INJECTOR | SUBCUTANEOUS | 5 refills | Status: DC

## 2022-06-06 NOTE — Patient Instructions (Signed)
Great job!!!

## 2022-06-06 NOTE — Progress Notes (Signed)
Patricia Berry , 06/09/1986, 36 y.o., female MRN: [id number] Patient Care Team    Relationship Specialty Notifications Start End  Ma Hillock, DO PCP - General Family Medicine  08/01/16   Janyth Contes, MD Consulting Physician Obstetrics and Gynecology  12/17/19   Skeet Latch, MD Attending Physician Cardiology  12/17/19   Jerene Bears, MD Consulting Physician Gastroenterology  12/17/19     Chief Complaint  Patient presents with   Weight Consult     Subjective: Pt presents for an OV discuss her obesity and insulin resistance. WT: 086>761 pounds BMI: 66.12>63.8 Is changing her diet and attempting to get used to his low glycemic index diet.  She is counting calories and staying around 1100-1200 cal a day.  She has not yet incorporated exercise.  She is tolerating the Ozempic taper without any side effects. Patient has struggled with weight for most of her life.  We did perform an insulin resistance score prior to appointment today and her scores 100.  C-peptide elevated at 3.49 and insulin elevated at 45..  A1c was 5.8-prediabetic.  Does not exercise routinely.  She does not follow a particular diet.  She does have 2 young children she cares for.       06/06/2022    3:53 PM 01/18/2022    9:02 AM 04/13/2021   11:08 AM 12/17/2019    9:38 AM 12/12/2017    9:21 AM  Depression screen PHQ 2/9  Decreased Interest 0 0 0 0 2  Down, Depressed, Hopeless 0 0 0 0 3  PHQ - 2 Score 0 0 0 0 5  Altered sleeping     3  Tired, decreased energy     3  Change in appetite     2  Feeling bad or failure about yourself      3  Trouble concentrating     2  Moving slowly or fidgety/restless     0  Suicidal thoughts     0  PHQ-9 Score     18  Difficult doing work/chores     Not difficult at all    No Known Allergies Social History   Social History Narrative   [name] lives with her husband Patricia Berry. She works FT as a Careers information officer, 8th grade. She enjoys traveling over  seas.   She has a master's degree.   Drinks caffeine.    Daily vitamin use.    Wears her seatbelt. Smoke detector in the home.    Exercises 3x week.    Feels safe in her relationships    Past Medical History:  Diagnosis Date   Anxiety    Cholestasis    Cholestasis during pregnancy    Depression    Heart disease    Hepatic steatosis    History of cholestasis during pregnancy 12/12/2017   Delivered April 2019, fetal demise   History of cold sores    History of fetal demise, not currently pregnant 12/12/2017   History of peripheral edema 04/25/2014   History of pre-eclampsia    Hypertension    NASH (nonalcoholic steatohepatitis)    Obesity    S/P cesarean section 01/10/2019   Status post vacuum-assisted vaginal delivery 2017/10/16   fetal death   Vitamin D deficiency    Past Surgical History:  Procedure Laterality Date   APPENDECTOMY     CESAREAN SECTION N/A 01/10/2019   Procedure: CESAREAN SECTION;  Surgeon: Janyth Contes, MD;  Location: [facility];  Service: Obstetrics;  Laterality: N/A;  TRacyRNFA, Heather if she can   CESAREAN SECTION N/A 02/20/2021   Procedure: CESAREAN SECTION;  Surgeon: Deliah Boston, MD;  Location: [facility] ORS;  Service: Obstetrics;  Laterality: N/A;  Repeat C/S increased BP   LAPAROSCOPIC APPENDECTOMY N/A 08/04/2017   Procedure: APPENDECTOMY LAPAROSCOPIC;  Surgeon: Alphonsa Overall, MD;  Location: [facility] ORS;  Service: General;  Laterality: N/A;   WISDOM TOOTH EXTRACTION     Family History  Problem Relation Age of Onset   Hyperlipidemia Mother    Hypertension Mother    Mental illness Father    Hypertension Father    Cancer Maternal Aunt        small cell   Heart disease Maternal Grandmother        heart attack   Early death Maternal Grandmother    Prostate cancer Maternal Grandfather    Pancreatic cancer Maternal Grandfather    Mental illness Paternal Grandmother    Liver disease Neg Hx    Allergies as of 06/06/2022   No Known Allergies       Medication List        Accurate as of June 06, 2022  5:06 PM. If you have any questions, ask your nurse or doctor.          STOP taking these medications    Vitamin D (Ergocalciferol) 1.25 MG (50000 UNIT) Caps capsule Commonly known as: DRISDOL Stopped by: Howard Pouch, DO       TAKE these medications    Semaglutide(0.25 or 0.5MG/DOS) 2 MG/3ML Sopn Inject 0.5 mg into the skin once a week. What changed: Another medication with the same name was added. Make sure you understand how and when to take each. Changed by: Howard Pouch, DO   Semaglutide (1 MG/DOSE) 4 MG/3ML Sopn Inject 1 mg as directed once a week. What changed: You were already taking a medication with the same name, and this prescription was added. Make sure you understand how and when to take each. Changed by: Howard Pouch, DO   valACYclovir 1000 MG tablet Commonly known as: VALTREX 2g once then repeat dose in 12 hours once at onset of cold sore        All past medical history, surgical history, allergies, family history, immunizations andmedications were updated in the EMR today and reviewed under the history and medication portions of their EMR.     ROS Negative, with the exception of above mentioned in HPI   Objective:  BP 131/87   Pulse 75   Temp 98.1 F (36.7 C) (Oral)   Ht 5' 4"  (1.626 m)   Wt (!) 372 lb (168.7 kg)   LMP 05/16/2022   SpO2 94%   BMI 63.85 kg/m  Body mass index is 63.85 kg/m. Physical Exam Vitals and nursing note reviewed.  Constitutional:      General: She is not in acute distress.    Appearance: Normal appearance. She is obese. She is not ill-appearing or toxic-appearing.  Eyes:     Extraocular Movements: Extraocular movements intact.     Conjunctiva/sclera: Conjunctivae normal.     Pupils: Pupils are equal, round, and reactive to light.  Neurological:     Mental Status: She is alert and oriented to person, place, and time. Mental status is at baseline.   Psychiatric:        Mood and Affect: Mood normal.        Behavior: Behavior normal.  Thought Content: Thought content normal.        Judgment: Judgment normal.     No results found. No results found. No results found for this or any previous visit (from the past 24 hour(s)).  Assessment/Plan: Patricia Berry is a 36 y.o. female present for OV for  Insulin resistance/Class 3 severe obesity with serious comorbidity and body mass index (BMI) of 60.0 to 69.9 in adult, unspecified obesity type (HCC)/ Morbid obesity (HCC)/Weight loss counseling, encounter for/Prediabetes Lost 13 lbs. Patient has been  counseled on exercise, calorie counting, weight loss and potential medications to help with weight loss today. -Patient has been provided with online resources for: Weekly net calorie calculator.  Applications for calorie counting.  Patient was advised to ensure she is taking in adequate nutrition daily by meeting calorie goals. -Patient has been educated on dietary changes to not only lose weight but to eat healthy.  Patient was educated on glycemic index. -Patient was educated on exercise goal of 150 minutes a week (plus warm up and cool down) of cardiovascular exercise.  Patient was educated on heart rate for cardiovascular and fat burning zones. -Patient was encouraged to maintain adequate water consumption of at least 120 ounces a day, more if exercising/sweating. He is tolerating Ozempic 0.5 mg, continue with Ozempic 0.5 mg x 4 then move on to Ozempic 1 mg weekly injection.  Prescription called in today  Follow-up in 6 weeks  Reviewed expectations re: course of current medical issues. Discussed self-management of symptoms. Outlined signs and symptoms indicating need for more acute intervention. Patient verbalized understanding and all questions were answered. Patient received an After-Visit Summary.    No orders of the defined types were placed in this encounter.  Meds ordered  this encounter  Medications   Semaglutide, 1 MG/DOSE, 4 MG/3ML SOPN    Sig: Inject 1 mg as directed once a week.    Dispense:  3 mL    Refill:  5   Referral Orders  No referral(s) requested today     Note is dictated utilizing voice recognition software. Although note has been proof read prior to signing, occasional typographical errors still can be missed. If any questions arise, please do not hesitate to call for verification.   electronically signed by:  Howard Pouch, DO  [place]

## 2022-07-18 ENCOUNTER — Encounter: Payer: Self-pay | Admitting: Family Medicine

## 2022-07-18 ENCOUNTER — Ambulatory Visit: Payer: BC Managed Care – PPO | Admitting: Family Medicine

## 2022-07-18 DIAGNOSIS — Z8249 Family history of ischemic heart disease and other diseases of the circulatory system: Secondary | ICD-10-CM

## 2022-07-18 DIAGNOSIS — E88819 Insulin resistance, unspecified: Secondary | ICD-10-CM | POA: Diagnosis not present

## 2022-07-18 DIAGNOSIS — Z6841 Body Mass Index (BMI) 40.0 and over, adult: Secondary | ICD-10-CM | POA: Diagnosis not present

## 2022-07-18 NOTE — Progress Notes (Signed)
Patricia Berry , [birth year], 37 y.o., female MRN: [id number] Patient Care Team    Relationship Specialty Notifications Start End  Ma Hillock, DO PCP - General Family Medicine  08/01/16   Janyth Contes, MD Consulting Physician Obstetrics and Gynecology  12/17/19   Skeet Latch, MD Attending Physician Cardiology  12/17/19   Jerene Bears, MD Consulting Physician Gastroenterology  12/17/19     Chief Complaint  Patient presents with   Prediabetes    Obesity, weight loss counseling.      Subjective: Pt presents for an OV discuss her obesity and insulin resistance. WT: 981>191>478 pounds BMI: 66.12>63.8> 63.6 She is following low glycemic index diet. She is counting calories and staying around 1100-1200 cal a day.   She still has not  incorporated exercise.    Prior note: Patient has struggled with weight for most of her life.  We did perform an insulin resistance score prior to appointment today and her scores 100.  C-peptide elevated at 3.49 and insulin elevated at 45..  A1c was 5.8-prediabetic.  Does not exercise routinely.  She does not follow a particular diet.  She does have 2 young children she cares for.      06/06/2022    3:53 PM 01/18/2022    9:02 AM 04/13/2021   11:08 AM 12/17/2019    9:38 AM 12/12/2017    9:21 AM  Depression screen PHQ 2/9  Decreased Interest 0 0 0 0 2  Down, Depressed, Hopeless 0 0 0 0 3  PHQ - 2 Score 0 0 0 0 5  Altered sleeping     3  Tired, decreased energy     3  Change in appetite     2  Feeling bad or failure about yourself      3  Trouble concentrating     2  Moving slowly or fidgety/restless     0  Suicidal thoughts     0  PHQ-9 Score     18  Difficult doing work/chores     Not difficult at all    No Known Allergies Social History   Social History Narrative   Patricia Berry lives with her husband Octavia Bruckner. She works FT as a Careers information officer, 8th grade. She enjoys traveling over seas.   She has a master's degree.    Drinks caffeine.    Daily vitamin use.    Wears her seatbelt. Smoke detector in the home.    Exercises 3x week.    Feels safe in her relationships    Past Medical History:  Diagnosis Date   Anxiety    Cholestasis    Cholestasis during pregnancy    Depression    Heart disease    Hepatic steatosis    History of cholestasis during pregnancy 12/12/2017   Delivered April 2019, fetal demise   History of cold sores    History of fetal demise, not currently pregnant 12/12/2017   History of peripheral edema 04/25/2014   History of pre-eclampsia    Hypertension    NASH (nonalcoholic steatohepatitis)    Obesity    S/P cesarean section 01/10/2019   Status post vacuum-assisted vaginal delivery 2017-10-17   fetal death   Vitamin D deficiency    Past Surgical History:  Procedure Laterality Date   APPENDECTOMY     CESAREAN SECTION N/A 01/10/2019   Procedure: CESAREAN SECTION;  Surgeon: Janyth Contes, MD;  Location: [facility]S;  Service: Obstetrics;  Laterality:  N/A;  [place] if she can   CESAREAN SECTION N/A 02/20/2021   Procedure: CESAREAN SECTION;  Surgeon: Deliah Boston, MD;  Location: [facility];  Service: Obstetrics;  Laterality: N/A;  Repeat C/S increased BP   LAPAROSCOPIC APPENDECTOMY N/A 08/04/2017   Procedure: APPENDECTOMY LAPAROSCOPIC;  Surgeon: Alphonsa Overall, MD;  Location: [facility] ORS;  Service: General;  Laterality: N/A;   WISDOM TOOTH EXTRACTION     Family History  Problem Relation Age of Onset   Hyperlipidemia Mother    Hypertension Mother    Mental illness Father    Hypertension Father    Cancer Maternal Aunt        small cell   Heart disease Maternal Grandmother        heart attack   Early death Maternal Grandmother    Prostate cancer Maternal Grandfather    Pancreatic cancer Maternal Grandfather    Mental illness Paternal Grandmother    Liver disease Neg Hx    Allergies as of 07/18/2022   No Known Allergies      Medication List         Accurate as of July 18, 2022  4:16 PM. If you have any questions, ask your nurse or doctor.          Semaglutide (1 MG/DOSE) 4 MG/3ML Sopn Inject 1 mg as directed once a week. What changed: Another medication with the same name was removed. Continue taking this medication, and follow the directions you see here. Changed by: Howard Pouch, DO   valACYclovir 1000 MG tablet Commonly known as: VALTREX 2g once then repeat dose in 12 hours once at onset of cold sore        All past medical history, surgical history, allergies, family history, immunizations andmedications were updated in the EMR today and reviewed under the history and medication portions of their EMR.     ROS Negative, with the exception of above mentioned in HPI   Objective:  BP 129/82   Pulse 79   Temp 98.1 F (36.7 C) (Oral)   Ht '5\' 4"'$  (1.626 m)   Wt (!) 371 lb (168.3 kg)   SpO2 97%   BMI 63.68 kg/m  Body mass index is 63.68 kg/m. Physical Exam Vitals and nursing note reviewed.  Constitutional:      General: She is not in acute distress.    Appearance: Normal appearance. She is obese. She is not ill-appearing or toxic-appearing.  Eyes:     Extraocular Movements: Extraocular movements intact.     Conjunctiva/sclera: Conjunctivae normal.     Pupils: Pupils are equal, round, and reactive to light.  Neurological:     Mental Status: She is alert and oriented to person, place, and time. Mental status is at baseline.  Psychiatric:        Mood and Affect: Mood normal.        Behavior: Behavior normal.        Thought Content: Thought content normal.        Judgment: Judgment normal.     No results found. No results found. No results found for this or any previous visit (from the past 24 hour(s)).  Assessment/Plan: HAILLE PARDI is a 37 y.o. female present for OV for  Insulin resistance/Class 3 severe obesity with serious comorbidity and body mass index (BMI) of 60.0 to 69.9 in adult, unspecified  obesity type (HCC)/ Morbid obesity (HCC)/Weight loss counseling, encounter for/Prediabetes Lost 13 lbs.> 1 lbs Patient has been  counseled  on exercise, calorie counting, weight loss and potential medications to help with weight loss today. -Patient has been provided with online resources for: Weekly net calorie calculator.  Applications for calorie counting.  Patient was advised to ensure she is taking in adequate nutrition daily by meeting calorie goals. -Patient has been educated on dietary changes to not only lose weight but to eat healthy.  Patient was educated on glycemic index. -Patient was educated on exercise goal of 150 minutes a week (plus warm up and cool down) of cardiovascular exercise.  Patient was educated on heart rate for cardiovascular and fat burning zones. -Patient was encouraged to maintain adequate water consumption of at least 120 ounces a day, more if exercising/sweating. Goal: She will incorporate 90 minutes of exercise by her next appointment. She is tolerating Ozempic 0.5 mg, >  she starts the ozempic 1 mg  today> follow up 6 weeks Follow-up 6 weeks  Reviewed expectations re: course of current medical issues. Discussed self-management of symptoms. Outlined signs and symptoms indicating need for more acute intervention. Patient verbalized understanding and all questions were answered. Patient received an After-Visit Summary.    No orders of the defined types were placed in this encounter.  No orders of the defined types were placed in this encounter.  Referral Orders  No referral(s) requested today     Note is dictated utilizing voice recognition software. Although note has been proof read prior to signing, occasional typographical errors still can be missed. If any questions arise, please do not hesitate to call for verification.   electronically signed by:  Howard Pouch, DO  [place]

## 2022-07-18 NOTE — Patient Instructions (Addendum)
Goal exercise 90 minutes a week.    Return in about 6 weeks (around 08/29/2022).        Great to see you today.  I have refilled the medication(s) we provide.   If labs were collected, we will inform you of lab results once received either by echart message or telephone call.   - echart message- for normal results that have been seen by the patient already.   - telephone call: abnormal results or if patient has not viewed results in their echart.

## 2022-08-02 ENCOUNTER — Telehealth: Payer: Self-pay

## 2022-08-02 NOTE — Telephone Encounter (Signed)
Patient was seen by Dr. Raoul Pitch on 1/29.  She states her medication was never sent to pharmacy, pharmacy is stating they have not rec'd RX from our office.  Please send prescription to [facility], 1 MG/DOSE, 4 MG/3ML SOPN

## 2022-08-02 NOTE — Telephone Encounter (Signed)
Spoke with patient regarding results/recommendations.  

## 2022-08-08 ENCOUNTER — Other Ambulatory Visit: Payer: Self-pay

## 2022-08-08 DIAGNOSIS — E559 Vitamin D deficiency, unspecified: Secondary | ICD-10-CM

## 2022-08-12 NOTE — Telephone Encounter (Signed)
Prior Patricia Berry is required by insurance.  Patient will call pharmacy to make sure PA form is faxed to our office.

## 2022-08-16 ENCOUNTER — Telehealth: Payer: Self-pay

## 2022-08-22 ENCOUNTER — Other Ambulatory Visit (HOSPITAL_COMMUNITY): Payer: Self-pay

## 2022-08-22 NOTE — Telephone Encounter (Signed)
Ozempic is only approved for type 2 diabetes. No documented indication of T2DM on patient's problem list. Please advise.

## 2022-08-22 NOTE — Telephone Encounter (Addendum)
Noted. Please continue with PA

## 2022-08-31 ENCOUNTER — Ambulatory Visit: Payer: BC Managed Care – PPO | Admitting: Family Medicine

## 2022-09-02 ENCOUNTER — Telehealth: Payer: BC Managed Care – PPO | Admitting: Family Medicine

## 2022-09-02 ENCOUNTER — Telehealth: Payer: Self-pay | Admitting: Family Medicine

## 2022-09-02 MED ORDER — VALACYCLOVIR HCL 1 G PO TABS: ORAL_TABLET | ORAL | 2 refills | Status: AC

## 2022-09-02 NOTE — Telephone Encounter (Signed)
Patient is requesting valACYclovir (VALTREX) 1000 MG table  for a cold sore. She was advised to call when this is needed. Pharmacy is correct. Patient ask that she receive a call if this request is approved.

## 2022-09-02 NOTE — Addendum Note (Signed)
Addended by: Deveron Furlong D on: 09/02/2022 11:41 AM   Modules accepted: Orders

## 2022-09-02 NOTE — Telephone Encounter (Signed)
Pt advised refill sent to pharmacy. 

## 2022-09-02 NOTE — Progress Notes (Deleted)
VIRTUAL VISIT VIA VIDEO  I connected with Nykayla L Meyer on 09/02/22 at  9:00 AM EDT by a video enabled telemedicine application and verified that I am speaking with the correct person using two identifiers. Location patient: Home Location provider: [facility], Office Persons participating in the virtual visit: Patient, Dr. Raoul Pitch and Marlou Porch, CMA  I discussed the limitations of evaluation and management by telemedicine and the availability of in person appointments. The patient expressed understanding and agreed to proceed.       Patricia Berry , 05-14-1986, 37 y.o., female MRN: [id number] Patient Care Team    Relationship Specialty Notifications Start End  Ma Hillock, DO PCP - General Family Medicine  08/01/16   Janyth Contes, MD Consulting Physician Obstetrics and Gynecology  12/17/19   Skeet Latch, MD Attending Physician Cardiology  12/17/19   Jerene Bears, MD Consulting Physician Gastroenterology  12/17/19     No chief complaint on file.    Subjective: Pt presents for an OV discuss her obesity and insulin resistance. WT: [phone] ***pounds BMI: 66.12>63.8> 63.6 She is following low glycemic index diet. She is counting calories and staying around 1100-1200 cal a day.   She still has not  incorporated exercise.    Prior note: Patient has struggled with weight for most of her life.  We did perform an insulin resistance score prior to appointment today and her scores 100.  C-peptide elevated at 3.49 and insulin elevated at 45..  A1c was 5.8-prediabetic.  Does not exercise routinely.  She does not follow a particular diet.  She does have 2 young children she cares for.      06/06/2022    3:53 PM 01/18/2022    9:02 AM 04/13/2021   11:08 AM 12/17/2019    9:38 AM 12/12/2017    9:21 AM  Depression screen PHQ 2/9  Decreased Interest 0 0 0 0 2  Down, Depressed, Hopeless 0 0 0 0 3  PHQ - 2 Score 0 0 0 0 5  Altered sleeping     3  Tired,  decreased energy     3  Change in appetite     2  Feeling bad or failure about yourself      3  Trouble concentrating     2  Moving slowly or fidgety/restless     0  Suicidal thoughts     0  PHQ-9 Score     18  Difficult doing work/chores     Not difficult at all    No Known Allergies Social History   Social History Narrative   Patricia. Berry lives with her husband Octavia Bruckner. She works FT as a Careers information officer, 8th grade. She enjoys traveling over seas.   She has a master's degree.   Drinks caffeine.    Daily vitamin use.    Wears her seatbelt. Smoke detector in the home.    Exercises 3x week.    Feels safe in her relationships    Past Medical History:  Diagnosis Date   Anxiety    Cholestasis    Cholestasis during pregnancy    Depression    Heart disease    Hepatic steatosis    History of cholestasis during pregnancy 12/12/2017   Delivered April 2019, fetal demise   History of cold sores    History of fetal demise, not currently pregnant 12/12/2017   History of peripheral edema 04/25/2014   History of pre-eclampsia  Hypertension    NASH (nonalcoholic steatohepatitis)    Obesity    S/P cesarean section 01/10/2019   Status post vacuum-assisted vaginal delivery 10-27-17   fetal death   Vitamin D deficiency    Past Surgical History:  Procedure Laterality Date   APPENDECTOMY     CESAREAN SECTION N/A 01/10/2019   Procedure: CESAREAN SECTION;  Surgeon: Janyth Contes, MD;  Location: [facility];  Service: Obstetrics;  Laterality: N/A;  TRacyRNFA, Heather if she can   CESAREAN SECTION N/A 02/20/2021   Procedure: CESAREAN SECTION;  Surgeon: Deliah Boston, MD;  Location: [facility];  Service: Obstetrics;  Laterality: N/A;  Repeat C/S increased BP   LAPAROSCOPIC APPENDECTOMY N/A 08/04/2017   Procedure: APPENDECTOMY LAPAROSCOPIC;  Surgeon: Alphonsa Overall, MD;  Location: [facility]S;  Service: General;  Laterality: N/A;   WISDOM TOOTH EXTRACTION     Family History  Problem  Relation Age of Onset   Hyperlipidemia Mother    Hypertension Mother    Mental illness Father    Hypertension Father    Cancer Maternal Aunt        small cell   Heart disease Maternal Grandmother        heart attack   Early death Maternal Grandmother    Prostate cancer Maternal Grandfather    Pancreatic cancer Maternal Grandfather    Mental illness Paternal Grandmother    Liver disease Neg Hx    Allergies as of 09/02/2022   No Known Allergies      Medication List        Accurate as of September 02, 2022  7:08 AM. If you have any questions, ask your nurse or doctor.          Semaglutide (1 MG/DOSE) 4 MG/3ML Sopn Inject 1 mg as directed once a week.   valACYclovir 1000 MG tablet Commonly known as: VALTREX 2g once then repeat dose in 12 hours once at onset of cold sore        All past medical history, surgical history, allergies, family history, immunizations andmedications were updated in the EMR today and reviewed under the history and medication portions of their EMR.     ROS Negative, with the exception of above mentioned in HPI   Objective:  There were no vitals taken for this visit. There is no height or weight on file to calculate BMI. Physical Exam Vitals and nursing note reviewed.  Constitutional:      General: She is not in acute distress.    Appearance: Normal appearance. She is not toxic-appearing.  HENT:     Head: Normocephalic and atraumatic.  Eyes:     General: No scleral icterus.       Right eye: No discharge.        Left eye: No discharge.     Conjunctiva/sclera: Conjunctivae normal.  Pulmonary:     Effort: Pulmonary effort is normal.  Musculoskeletal:     Cervical back: Normal range of motion.  Skin:    Findings: No rash.  Neurological:     Mental Status: She is alert and oriented to person, place, and time. Mental status is at baseline.  Psychiatric:        Mood and Affect: Mood normal.        Behavior: Behavior normal.         Thought Content: Thought content normal.        Judgment: Judgment normal.     No results found. No results found. No results  found for this or any previous visit (from the past 24 hour(s)).  Assessment/Plan: Patricia Berry is a 37 y.o. female present for OV for  Insulin resistance/Class 3 severe obesity with serious comorbidity and body mass index (BMI) of 60.0 to 69.9 in adult, unspecified obesity type (HCC)/ Morbid obesity (HCC)/Weight loss counseling, encounter for/Prediabetes Lost 13 lbs.> 1 lbs Patient has been  counseled on exercise, calorie counting, weight loss and potential medications to help with weight loss today. -Patient has been provided with online resources for: Weekly net calorie calculator.  Applications for calorie counting.  Patient was advised to ensure she is taking in adequate nutrition daily by meeting calorie goals. -Patient has been educated on dietary changes to not only lose weight but to eat healthy.  Patient was educated on glycemic index. -Patient was educated on exercise goal of 150 minutes a week (plus warm up and cool down) of cardiovascular exercise.  Patient was educated on heart rate for cardiovascular and fat burning zones. -Patient was encouraged to maintain adequate water consumption of at least 120 ounces a day, more if exercising/sweating. Goal: She will incorporate 90 minutes of exercise by her next appointment. She is tolerating Ozempic 0.5 mg, >  she starts the ozempic 1 mg  today> follow up 6 weeks Follow-up 6 weeks  Reviewed expectations re: course of current medical issues. Discussed self-management of symptoms. Outlined signs and symptoms indicating need for more acute intervention. Patient verbalized understanding and all questions were answered. Patient received an After-Visit Summary.    No orders of the defined types were placed in this encounter.  No orders of the defined types were placed in this encounter.  Referral Orders   No referral(s) requested today     Note is dictated utilizing voice recognition software. Although note has been proof read prior to signing, occasional typographical errors still can be missed. If any questions arise, please do not hesitate to call for verification.   electronically signed by:  Howard Pouch, DO  [place]

## 2022-09-05 ENCOUNTER — Telehealth (INDEPENDENT_AMBULATORY_CARE_PROVIDER_SITE_OTHER): Payer: BC Managed Care – PPO | Admitting: Family Medicine

## 2022-09-05 ENCOUNTER — Encounter: Payer: Self-pay | Admitting: Family Medicine

## 2022-09-05 VITALS — Wt 363.0 lb

## 2022-09-05 DIAGNOSIS — Z6841 Body Mass Index (BMI) 40.0 and over, adult: Secondary | ICD-10-CM | POA: Diagnosis not present

## 2022-09-05 DIAGNOSIS — R7303 Prediabetes: Secondary | ICD-10-CM

## 2022-09-05 DIAGNOSIS — Z713 Dietary counseling and surveillance: Secondary | ICD-10-CM

## 2022-09-05 NOTE — Patient Instructions (Signed)
No follow-ups on file.        Great to see you today.  I have refilled the medication(s) we provide.   If labs were collected, we will inform you of lab results once received either by echart message or telephone call.   - echart message- for normal results that have been seen by the patient already.   - telephone call: abnormal results or if patient has not viewed results in their echart.  

## 2022-09-05 NOTE — Progress Notes (Signed)
VIRTUAL VISIT VIA VIDEO  I connected with Patricia Berry on 09/05/22 at 10:20 AM EDT by a video enabled telemedicine application and verified that I am speaking with the correct person using two identifiers. Location patient: Home Location provider: [facility], Office Persons participating in the virtual visit: Patient, Dr. Raoul Pitch and Marlou Porch, CMA  I discussed the limitations of evaluation and management by telemedicine and the availability of in person appointments. The patient expressed understanding and agreed to proceed.     Patricia Berry , [birth year], 37 y.o., female MRN: [id number] Patient Care Team    Relationship Specialty Notifications Start End  Ma Hillock, DO PCP - General Family Medicine  08/01/16   Janyth Contes, MD Consulting Physician Obstetrics and Gynecology  12/17/19   Skeet Latch, MD Attending Physician Cardiology  12/17/19   Jerene Bears, MD Consulting Physician Gastroenterology  12/17/19     Chief Complaint  Patient presents with   Obesity     Subjective: Pt presents for an OV discuss her obesity and insulin resistance. WT: 385>372>371>363 pounds BMI: 66.12>63.8> 63.6>62.3 She is compliant with  low glycemic index diet. She is counting calories and staying around 1100-1200 cal a day.   She has started to exercise and is fitting in 90 min or more a week.   Prior note: Patient has struggled with weight for most of her life.  We did perform an insulin resistance score prior to appointment today and her scores 100.  C-peptide elevated at 3.49 and insulin elevated at 45..  A1c was 5.8-prediabetic.  Does not exercise routinely.  She does not follow a particular diet.  She does have 2 young children she cares for.      06/06/2022    3:53 PM 01/18/2022    9:02 AM 04/13/2021   11:08 AM 12/17/2019    9:38 AM 12/12/2017    9:21 AM  Depression screen PHQ 2/9  Decreased Interest 0 0 0 0 2  Down, Depressed, Hopeless 0 0 0 0 3   PHQ - 2 Score 0 0 0 0 5  Altered sleeping     3  Tired, decreased energy     3  Change in appetite     2  Feeling bad or failure about yourself      3  Trouble concentrating     2  Moving slowly or fidgety/restless     0  Suicidal thoughts     0  PHQ-9 Score     18  Difficult doing work/chores     Not difficult at all    No Known Allergies Social History   Social History Narrative   Patricia Berry lives with her husband Octavia Bruckner. She works FT as a Careers information officer, 8th grade. She enjoys traveling over seas.   She has a master's degree.   Drinks caffeine.    Daily vitamin use.    Wears her seatbelt. Smoke detector in the home.    Exercises 3x week.    Feels safe in her relationships    Past Medical History:  Diagnosis Date   Anxiety    Cholestasis    Cholestasis during pregnancy    Depression    Heart disease    Hepatic steatosis    History of cholestasis during pregnancy 12/12/2017   Delivered April 2019, fetal demise   History of cold sores    History of fetal demise, not currently pregnant 12/12/2017  History of peripheral edema 04/25/2014   History of pre-eclampsia    Hypertension    NASH (nonalcoholic steatohepatitis)    Obesity    S/P cesarean section 01/10/2019   Status post vacuum-assisted vaginal delivery 10/12/17   fetal death   Vitamin D deficiency    Past Surgical History:  Procedure Laterality Date   APPENDECTOMY     CESAREAN SECTION N/A 01/10/2019   Procedure: CESAREAN SECTION;  Surgeon: Janyth Contes, MD;  Location: [facility];  Service: Obstetrics;  Laterality: N/A;  TRacyRNFA, Heather if she can   CESAREAN SECTION N/A 02/20/2021   Procedure: CESAREAN SECTION;  Surgeon: Deliah Boston, MD;  Location: [facility];  Service: Obstetrics;  Laterality: N/A;  Repeat C/S increased BP   LAPAROSCOPIC APPENDECTOMY N/A 08/04/2017   Procedure: APPENDECTOMY LAPAROSCOPIC;  Surgeon: Alphonsa Overall, MD;  Location: [facility];  Service: General;  Laterality: N/A;    WISDOM TOOTH EXTRACTION     Family History  Problem Relation Age of Onset   Hyperlipidemia Mother    Hypertension Mother    Mental illness Father    Hypertension Father    Cancer Maternal Aunt        small cell   Heart disease Maternal Grandmother        heart attack   Early death Maternal Grandmother    Prostate cancer Maternal Grandfather    Pancreatic cancer Maternal Grandfather    Mental illness Paternal Grandmother    Liver disease Neg Hx    Allergies as of 09/05/2022   No Known Allergies      Medication List        Accurate as of September 05, 2022 10:32 AM. If you have any questions, ask your nurse or doctor.          Semaglutide (1 MG/DOSE) 4 MG/3ML Sopn Inject 1 mg as directed once a week.   valACYclovir 1000 MG tablet Commonly known as: VALTREX 2g once then repeat dose in 12 hours once at onset of cold sore        All past medical history, surgical history, allergies, family history, immunizations andmedications were updated in the EMR today and reviewed under the history and medication portions of their EMR.     ROS Negative, with the exception of above mentioned in HPI   Objective:  Wt (!) 363 lb (164.7 kg)   BMI 62.31 kg/m  Body mass index is 62.31 kg/m. Physical Exam Vitals and nursing note reviewed.  Constitutional:      General: She is not in acute distress.    Appearance: Normal appearance. She is obese. She is not ill-appearing or toxic-appearing.  HENT:     Head: Normocephalic and atraumatic.  Eyes:     General: No scleral icterus.       Right eye: No discharge.        Left eye: No discharge.     Extraocular Movements: Extraocular movements intact.     Conjunctiva/sclera: Conjunctivae normal.     Pupils: Pupils are equal, round, and reactive to light.  Pulmonary:     Effort: Pulmonary effort is normal.  Musculoskeletal:     Cervical back: Normal range of motion.  Skin:    Findings: No rash.  Neurological:     Mental Status:  She is alert and oriented to person, place, and time. Mental status is at baseline.  Psychiatric:        Mood and Affect: Mood normal.  Behavior: Behavior normal.        Thought Content: Thought content normal.        Judgment: Judgment normal.     No results found. No results found. No results found for this or any previous visit (from the past 24 hour(s)).  Assessment/Plan: JACARIA LIGGON is a 37 y.o. female present for OV for  Insulin resistance/Class 3 severe obesity with serious comorbidity and body mass index (BMI) of 60.0 to 69.9 in adult, unspecified obesity type (HCC)/ Morbid obesity (HCC)/Weight loss counseling, encounter for/Prediabetes Patient has been  counseled on exercise, calorie counting, weight loss and potential medications to help with weight loss today. -Patient has been provided with online resources for: Weekly net calorie calculator.  Applications for calorie counting.  Patient was advised to ensure she is taking in adequate nutrition daily by meeting calorie goals. -Patient has been educated on dietary changes to not only lose weight but to eat healthy.  Patient was educated on glycemic index. -Patient was educated on exercise goal of 150 minutes a week (plus warm up and cool down) of cardiovascular exercise.  Patient was educated on heart rate for cardiovascular and fat burning zones. -Patient was encouraged to maintain adequate water consumption of at least 120 ounces a day, more if exercising/sweating. Goal: She has achieved her exercise goal and is increasing exercise when alble. She was  tolerating Ozempic  1 mg, but insurance or pharmacy is holding up her scripts. She will look into her insurance and we will tryt o help her figure it out again. This had been approved for her already.  Reviewed expectations re: course of current medical issues. Discussed self-management of symptoms. Outlined signs and symptoms indicating need for more acute  intervention. Patient verbalized understanding and all questions were answered. Patient received an After-Visit Summary.    No orders of the defined types were placed in this encounter.  No orders of the defined types were placed in this encounter.  Referral Orders  No referral(s) requested today     Note is dictated utilizing voice recognition software. Although note has been proof read prior to signing, occasional typographical errors still can be missed. If any questions arise, please do not hesitate to call for verification.   electronically signed by:  Howard Pouch, DO  [place]

## 2022-09-08 ENCOUNTER — Other Ambulatory Visit (HOSPITAL_COMMUNITY): Payer: Self-pay

## 2022-09-08 NOTE — Telephone Encounter (Signed)
Pharmacy Patient Advocate Encounter   Received notification from [facility] that prior authorization for Ozempic (1 MG/DOSE) 4MG /3ML pen-injectors is required/requested.  Per Test Claim: PA required   PA submitted on 09/08/22 to (ins) [employer] via CoverMyMeds Key or (Medicaid) confirmation # [id number] Status is pending

## 2022-09-16 ENCOUNTER — Other Ambulatory Visit (HOSPITAL_COMMUNITY): Payer: Self-pay

## 2022-09-16 NOTE — Telephone Encounter (Signed)
Pharmacy Patient Advocate Encounter  Received notification from [facility] [employer] that the request for prior authorization for Ozempic has been denied due to .    Please be advised we currently do not have a Pharmacist to review denials, therefore you will need to process appeals accordingly as needed. Thanks for your support at this time.   You may fax [phone], to appeal.

## 2022-09-20 MED ORDER — WEGOVY 1 MG/0.5ML ~~LOC~~ SOAJ: 1.0000 mg | SUBCUTANEOUS | 3 refills | Status: DC

## 2022-09-20 NOTE — Telephone Encounter (Signed)
Please inform patient her Ozempic has been denied by her pharmacy.  Attempted preauthorization was also denied.  Medication is no longer being covered underneath her insurance plan unless she is a diabetic, which she is not.  I have called in the [facility] in its place.  These are the same exact medications under different brand names and labeling.  I am not certain if her insurance will cover this under obesity code/weight loss.  She will need to let us know if it is covered.  Or if there is another injectable that her insurance does cover

## 2022-09-20 NOTE — Addendum Note (Signed)
Addended by: Howard Pouch A on: 09/20/2022 10:38 AM   Modules accepted: Orders

## 2022-09-20 NOTE — Telephone Encounter (Signed)
LM for pt to return call to discuss.  

## 2022-09-20 NOTE — Telephone Encounter (Signed)
Ozempic denial letter attached to chart

## 2022-10-05 ENCOUNTER — Ambulatory Visit: Payer: BC Managed Care – PPO | Admitting: Family Medicine

## 2022-10-05 ENCOUNTER — Encounter: Payer: Self-pay | Admitting: Family Medicine

## 2022-10-05 VITALS — BP 109/70 | HR 90 | Temp 98.0°F | Ht 64.0 in | Wt 371.2 lb

## 2022-10-05 DIAGNOSIS — J029 Acute pharyngitis, unspecified: Secondary | ICD-10-CM

## 2022-10-05 DIAGNOSIS — J069 Acute upper respiratory infection, unspecified: Secondary | ICD-10-CM

## 2022-10-05 LAB — POC COVID19 BINAXNOW: SARS Coronavirus 2 Ag: NEGATIVE

## 2022-10-05 LAB — POCT RAPID STREP A (OFFICE): Rapid Strep A Screen: NEGATIVE

## 2022-10-05 NOTE — Patient Instructions (Signed)
Use over the counter mucinex DM as directed on the packaging.  Use saline nasal spray to clear nasal mucous.  Tylenol or ibuprofen or aleve are fine to take for aches/fever.

## 2022-10-05 NOTE — Progress Notes (Signed)
OFFICE VISIT  2022/10/18  CC:  Chief Complaint  Patient presents with   Sore Throat    Starting 3 days ago, very sore throat, loss of energy, fuzzy headed, vomiting x2d, last night fever 102, prod cough (clear phlegm).  Took ibuprofen yesterday for HA    Patient is a 37 y.o. female who presents for sore throat.  HPI: 2d nasal cong/runny nose, HA, fatigue, ST, and cough.  No wheeze or SOB. Sx's worse in mornings, some post-tussive emesis.  No nausea and no diarrhea.  No rash. Has 2 kids in daycare and they had a note from teacher recently stating a child in their class had strep.  Her kids did not get ill.   Past Medical History:  Diagnosis Date   Anxiety    Cholestasis    Cholestasis during pregnancy    Depression    Heart disease    Hepatic steatosis    History of cholestasis during pregnancy 12/12/2017   Delivered April 2019, fetal demise   History of cold sores    History of fetal demise, not currently pregnant 12/12/2017   History of peripheral edema 04/25/2014   History of pre-eclampsia    Hypertension    NASH (nonalcoholic steatohepatitis)    Obesity    S/P cesarean section 01/10/2019   Status post vacuum-assisted vaginal delivery 17-Oct-2017   fetal death   Vitamin D deficiency     Past Surgical History:  Procedure Laterality Date   APPENDECTOMY     CESAREAN SECTION N/A 01/10/2019   Procedure: CESAREAN SECTION;  Surgeon: Sherian Rein, MD;  Location: [facility];  Service: Obstetrics;  Laterality: N/A;  TRacyRNFA, Heather if she can   CESAREAN SECTION N/A 02/20/2021   Procedure: CESAREAN SECTION;  Surgeon: Carlisle Cater, MD;  Location: [facility];  Service: Obstetrics;  Laterality: N/A;  Repeat C/S increased BP   LAPAROSCOPIC APPENDECTOMY N/A 08/04/2017   Procedure: APPENDECTOMY LAPAROSCOPIC;  Surgeon: Ovidio Kin, MD;  Location: [facility] ORS;  Service: General;  Laterality: N/A;   WISDOM TOOTH EXTRACTION      Outpatient Medications Prior to Visit   Medication Sig Dispense Refill   Semaglutide, 1 MG/DOSE, 4 MG/3ML SOPN Inject 1 mg as directed once a week. (Patient not taking: Reported on 09/05/2022) 3 mL 5   valACYclovir (VALTREX) 1000 MG tablet 2g once then repeat dose in 12 hours once at onset of cold sore (Patient not taking: Reported on October 18, 2022) 20 tablet 2   WEGOVY 1 MG/0.5ML SOAJ Inject 1 mg into the skin once a week. (Patient not taking: Reported on 18-Oct-2022) 2 mL 3   No facility-administered medications prior to visit.    No Known Allergies  Review of Systems  As per HPI  PE:    2022-10-18    1:19 PM 09/05/2022   10:14 AM 07/18/2022    3:49 PM  Vitals with BMI  Height     Weight 371 lbs 3 oz 363 lbs 371 lbs  BMI 63.68  63.65  Systolic 109  129  Diastolic 70  82  Pulse 90  79     Physical Exam  VS: noted--normal. Gen: alert, NAD, NONTOXIC APPEARING. HEENT: eyes without injection, drainage, or swelling.  Ears: EACs clear, TMs with normal light reflex and landmarks.  Nose: Clear rhinorrhea, with some dried, crusty exudate adherent to mildly injected mucosa.  No purulent d/c.  No paranasal sinus TTP.  No facial swelling.  Throat and mouth without focal lesion.  No pharyngial swelling, erythema, or exudate.   Neck: supple, no LAD.   LUNGS: CTA bilat, nonlabored resps.   CV: RRR, no m/r/g. EXT: no c/c/e SKIN: no rash   LABS:  Last CBC Lab Results  Component Value Date   WBC 6.8 01/18/2022   HGB 11.7 (L) 01/18/2022   HCT 36.5 01/18/2022   MCV 74.5 (L) 01/18/2022   MCH 26.7 02/21/2021   RDW 16.4 (H) 01/18/2022   PLT 322.0 01/18/2022   Last metabolic panel Lab Results  Component Value Date   GLUCOSE 87 01/18/2022   NA 139 01/18/2022   K 4.4 01/18/2022   CL 104 01/18/2022   CO2 27 01/18/2022   BUN 10 01/18/2022   CREATININE 0.76 01/18/2022   GFRNONAA >60 02/20/2021   CALCIUM 9.0 01/18/2022   PROT 6.8 01/18/2022   ALBUMIN 4.0 01/18/2022   BILITOT 0.4 01/18/2022   ALKPHOS 74 01/18/2022    AST 19 01/18/2022   ALT 30 01/18/2022   ANIONGAP 7 02/19/2021    IMPRESSION AND PLAN:  URI with cough and congestion.  Viral etiology suspected. Symptomatic care with Mucinex DM and saline nasal spray discussed.  Tylenol or Motrin as needed fever and bodyaches.  Rapid strep today: NEG Covid test today: NEG  An After Visit Summary was printed and given to the patient.  FOLLOW UP: Return if symptoms worsen or fail to improve.  Signed:  Santiago Bumpers, MD           10/05/2022

## 2023-02-17 ENCOUNTER — Encounter: Payer: BC Managed Care – PPO | Admitting: Family Medicine

## 2023-03-01 ENCOUNTER — Encounter: Payer: Self-pay | Admitting: Family Medicine

## 2023-03-01 ENCOUNTER — Ambulatory Visit (INDEPENDENT_AMBULATORY_CARE_PROVIDER_SITE_OTHER): Payer: BC Managed Care – PPO | Admitting: Family Medicine

## 2023-03-01 VITALS — BP 122/82 | HR 85 | Temp 98.3°F | Ht 64.0 in | Wt 374.6 lb

## 2023-03-01 DIAGNOSIS — Z0000 Encounter for general adult medical examination without abnormal findings: Secondary | ICD-10-CM | POA: Diagnosis not present

## 2023-03-01 DIAGNOSIS — E559 Vitamin D deficiency, unspecified: Secondary | ICD-10-CM

## 2023-03-01 DIAGNOSIS — Z6841 Body Mass Index (BMI) 40.0 and over, adult: Secondary | ICD-10-CM | POA: Diagnosis not present

## 2023-03-01 DIAGNOSIS — R7303 Prediabetes: Secondary | ICD-10-CM | POA: Insufficient documentation

## 2023-03-01 DIAGNOSIS — Z2821 Immunization not carried out because of patient refusal: Secondary | ICD-10-CM

## 2023-03-01 DIAGNOSIS — Z8249 Family history of ischemic heart disease and other diseases of the circulatory system: Secondary | ICD-10-CM | POA: Diagnosis not present

## 2023-03-01 NOTE — Patient Instructions (Addendum)
        Great to see you today.  I have refilled the medication(s) we provide.   If labs were collected or images ordered, we will inform you of  results once we have received them and reviewed. We will contact you either by echart message, or telephone call.  Please give ample time to the testing facility, and our office to run,  receive and review results. Please do not call inquiring of results, even if you can see them in your chart. We will contact you as soon as we are able. If it has been over 1 week since the test was completed, and you have not yet heard from [place], then please call us.    - echart message- for normal results that have been seen by the patient already.   - telephone call: abnormal results or if patient has not viewed results in their echart.  If a referral to a specialist was entered for you, please call us in 2 weeks if you have not heard from the specialist office to schedule.

## 2023-03-01 NOTE — Progress Notes (Signed)
Patient ID: Patricia Berry, female  DOB: 1985/08/20, 37 y.o.   MRN: [id number] Patient Care Team    Relationship Specialty Notifications Start End  Natalia Leatherwood, DO PCP - General Family Medicine  08/01/16 04/19/23  Sherian Rein, MD Consulting Physician Obstetrics and Gynecology  12/17/19   Chilton Si, MD Attending Physician Cardiology  12/17/19   Beverley Fiedler, MD Consulting Physician Gastroenterology  12/17/19     Chief Complaint  Patient presents with   Annual Exam    Pt is not fasting    Subjective: Patricia Berry is a 37 y.o.  Female  present for CPE All past medical history, surgical history, allergies, family history, immunizations, medications and social history were updated in the electronic medical record today. All recent labs, ED visits and hospitalizations within the last year were reviewed.  Health maintenance:  Colonoscopy: no fhx. Routine screen at 46 Mammogram: no fhx, routine screen at 41 Cervical cancer screening: last pap: 06/2020, GYN- normal Immunizations: tdap 2022, Influenza declined (encouraged yearly) Infectious disease screening: HIV and Hep C completed DEXA: routine  Patient has a Dental home. Hospitalizations/ED visits: Reviewed       2022/11/03    1:57 PM 06/06/2022    3:53 PM 01/18/2022    9:02 AM 04/13/2021   11:08 AM 12/17/2019    9:38 AM  Depression screen PHQ 2/9  Decreased Interest 1 0 0 0 0  Down, Depressed, Hopeless 1 0 0 0 0  PHQ - 2 Score 2 0 0 0 0  Altered sleeping 0      Tired, decreased energy 2      Change in appetite 1      Feeling bad or failure about yourself  0      Trouble concentrating 0      Moving slowly or fidgety/restless 0      Suicidal thoughts 0      PHQ-9 Score 5      Difficult doing work/chores Not difficult at all          2022/11/03    1:57 PM  GAD 7 : Generalized Anxiety Score  Nervous, Anxious, on Edge 1  Control/stop worrying 1  Worry too much - different things 0  Trouble  relaxing 0  Restless 0  Easily annoyed or irritable 1  Afraid - awful might happen 1  Total GAD 7 Score 4  Anxiety Difficulty Not difficult at all     Immunization History  Administered Date(s) Administered   Influenza,inj,Quad PF,6+ Mos 03/20/2017, 04/13/2021, 05/02/2022   Influenza,inj,quad, With Preservative 06/03/2020   Influenza-Unspecified 03/20/2017, 07/06/2018   PFIZER(Purple Top)SARS-COV-2 Vaccination 08/16/2019, 09/06/2019, 06/10/2020   PPD Test 06/17/2014   Tdap 08/03/2017, 11/05/2018, 12/07/2020    Past Medical History:  Diagnosis Date   Anxiety    Cholestasis    Cholestasis during pregnancy    Depression    Heart disease    Hepatic steatosis    History of cholestasis during pregnancy 12/12/2017   Delivered April 2019, fetal demise   History of cold sores    History of fetal demise, not currently pregnant 12/12/2017   History of peripheral edema 04/25/2014   History of pre-eclampsia    Hypertension    NASH (nonalcoholic steatohepatitis)    Obesity    S/P cesarean section 01/10/2019   Status post vacuum-assisted vaginal delivery Nov 02, 2017   fetal death   Vitamin D deficiency    No Known Allergies Past Surgical History:  Procedure Laterality Date  APPENDECTOMY     CESAREAN SECTION N/A 01/10/2019   Procedure: CESAREAN SECTION;  Surgeon: Sherian Rein, MD;  Location: [facility];  Service: Obstetrics;  Laterality: N/A;  TRacyRNFA, Heather if she can   CESAREAN SECTION N/A 02/20/2021   Procedure: CESAREAN SECTION;  Surgeon: Carlisle Cater, MD;  Location: [facility];  Service: Obstetrics;  Laterality: N/A;  Repeat C/S increased BP   LAPAROSCOPIC APPENDECTOMY N/A 08/04/2017   Procedure: APPENDECTOMY LAPAROSCOPIC;  Surgeon: Ovidio Kin, MD;  Location: [facility] ORS;  Service: General;  Laterality: N/A;   WISDOM TOOTH EXTRACTION     Family History  Problem Relation Age of Onset   Hyperlipidemia Mother    Hypertension Mother    Mental illness Father     Hypertension Father    Cancer Maternal Aunt        small cell   Heart disease Maternal Grandmother        heart attack   Early death Maternal Grandmother    Prostate cancer Maternal Grandfather    Pancreatic cancer Maternal Grandfather    Mental illness Paternal Grandmother    Liver disease Neg Hx    Social History   Social History Narrative   Patricia Berry lives with her husband Jorja Loa. She works FT as a Scientist, water quality, 8th grade. She enjoys traveling over seas.   She has a master's degree.   Drinks caffeine.    Daily vitamin use.    Wears her seatbelt. Smoke detector in the home.    Exercises 3x week.    Feels safe in her relationships     Allergies as of 03/01/2023   No Known Allergies      Medication List        Accurate as of March 01, 2023  2:46 PM. If you have any questions, ask your nurse or doctor.          STOP taking these medications    Semaglutide (1 MG/DOSE) 4 MG/3ML Sopn Stopped by: Nolen Mu 1 MG/0.5ML Soaj Generic drug: Semaglutide-Weight Management Stopped by: Felix Pacini       TAKE these medications    valACYclovir 1000 MG tablet Commonly known as: VALTREX 2g once then repeat dose in 12 hours once at onset of cold sore        All past medical history, surgical history, allergies, family history, immunizations andmedications were updated in the EMR today and reviewed under the history and medication portions of their EMR.     No results found for this or any previous visit (from the past 2160 hour(s)).  No results found.   ROS 14 pt review of systems performed and negative (unless mentioned in an HPI)  Objective: BP 122/82   Pulse 85   Temp 98.3 F (36.8 C)   Ht 5\' 4"  (1.626 m)   Wt (!) 374 lb 9.6 oz (169.9 kg)   SpO2 97%   BMI 64.30 kg/m  Physical Exam Vitals and nursing note reviewed.  Constitutional:      General: She is not in acute distress.    Appearance: Normal appearance. She is obese. She  is not ill-appearing or toxic-appearing.  HENT:     Head: Normocephalic and atraumatic.     Right Ear: Tympanic membrane, ear canal and external ear normal. There is no impacted cerumen.     Left Ear: Tympanic membrane, ear canal and external ear normal. There is no impacted cerumen.     Nose: No congestion  or rhinorrhea.     Mouth/Throat:     Mouth: Mucous membranes are moist.     Pharynx: Oropharynx is clear. No oropharyngeal exudate or posterior oropharyngeal erythema.  Eyes:     General: No scleral icterus.       Right eye: No discharge.        Left eye: No discharge.     Extraocular Movements: Extraocular movements intact.     Conjunctiva/sclera: Conjunctivae normal.     Pupils: Pupils are equal, round, and reactive to light.  Cardiovascular:     Rate and Rhythm: Normal rate and regular rhythm.     Pulses: Normal pulses.     Heart sounds: Normal heart sounds. No murmur heard.    No friction rub. No gallop.  Pulmonary:     Effort: Pulmonary effort is normal. No respiratory distress.     Breath sounds: Normal breath sounds. No stridor. No wheezing, rhonchi or rales.  Chest:     Chest wall: No tenderness.  Abdominal:     General: Abdomen is flat. Bowel sounds are normal. There is no distension.     Palpations: Abdomen is soft. There is no mass.     Tenderness: There is no abdominal tenderness. There is no right CVA tenderness, left CVA tenderness, guarding or rebound.     Hernia: No hernia is present.  Musculoskeletal:        General: No swelling, tenderness or deformity. Normal range of motion.     Cervical back: Normal range of motion and neck supple. No rigidity or tenderness.     Right lower leg: No edema.     Left lower leg: No edema.  Lymphadenopathy:     Cervical: No cervical adenopathy.  Skin:    General: Skin is warm and dry.     Coloration: Skin is not jaundiced or pale.     Findings: No bruising, erythema, lesion or rash.  Neurological:     General: No focal  deficit present.     Mental Status: She is alert and oriented to person, place, and time. Mental status is at baseline.     Cranial Nerves: No cranial nerve deficit.     Sensory: No sensory deficit.     Motor: No weakness.     Coordination: Coordination normal.     Gait: Gait normal.     Deep Tendon Reflexes: Reflexes normal.  Psychiatric:        Mood and Affect: Mood normal.        Behavior: Behavior normal.        Thought Content: Thought content normal.        Judgment: Judgment normal.     No results found.  Assessment/plan: LINETTE OSTERMEYER is a 37 y.o. female present for CPE Prediabetes: A1c collected today - Lipid panel  Vitamin D deficiency - Vitamin D (25 hydroxy) FH: heart disease/Morbid obesity (HCC) - Lipid panel  Influenza vaccination declined  Routine general medical examination at a health care facilit Patient was encouraged to exercise greater than 150 minutes a week. Patient was encouraged to choose a diet filled with fresh fruits and vegetables, and lean meats. AVS provided to patient today for education/recommendation on gender specific health and safety maintenance. Colonoscopy: no fhx. Routine screen at 46 Mammogram: no fhx, routine screen at 41 Cervical cancer screening: last pap: 06/2020, GYN- normal Immunizations: tdap 2022, Influenza declined (encouraged yearly) Infectious disease screening: HIV and Hep C completed DEXA: routine    Moving to  [place], [place] in October.    Orders Placed This Encounter  Procedures   CBC with Differential/Platelet   Comprehensive metabolic panel   Hemoglobin A1c   TSH   Lipid panel   Vitamin D (25 hydroxy)   No orders of the defined types were placed in this encounter.  Referral Orders  No referral(s) requested today      Electronically signed by: Felix Pacini, DO  [facility] [place]

## 2023-03-02 LAB — CBC WITH DIFFERENTIAL/PLATELET
Basophils Absolute: 0.1 10*3/uL (ref 0.0–0.1)
Basophils Relative: 0.9 % (ref 0.0–3.0)
Eosinophils Absolute: 0.2 10*3/uL (ref 0.0–0.7)
Eosinophils Relative: 1.9 % (ref 0.0–5.0)
HCT: 35.7 % — ABNORMAL LOW (ref 36.0–46.0)
Hemoglobin: 10.9 g/dL — ABNORMAL LOW (ref 12.0–15.0)
Lymphocytes Relative: 31 % (ref 12.0–46.0)
Lymphs Abs: 2.6 10*3/uL (ref 0.7–4.0)
MCHC: 30.7 g/dL (ref 30.0–36.0)
MCV: 72.6 fl — ABNORMAL LOW (ref 78.0–100.0)
Monocytes Absolute: 0.5 10*3/uL (ref 0.1–1.0)
Monocytes Relative: 5.6 % (ref 3.0–12.0)
Neutro Abs: 5.1 10*3/uL (ref 1.4–7.7)
Neutrophils Relative %: 60.6 % (ref 43.0–77.0)
Platelets: 391 10*3/uL (ref 150.0–400.0)
RBC: 4.91 Mil/uL (ref 3.87–5.11)
RDW: 18 % — ABNORMAL HIGH (ref 11.5–15.5)
WBC: 8.4 10*3/uL (ref 4.0–10.5)

## 2023-03-02 LAB — VITAMIN D 25 HYDROXY (VIT D DEFICIENCY, FRACTURES): VITD: 22.4 ng/mL — ABNORMAL LOW (ref 30.00–100.00)

## 2023-03-02 LAB — LIPID PANEL
Cholesterol: 149 mg/dL (ref 0–200)
HDL: 42.1 mg/dL (ref >39.00)
LDL Cholesterol: 86 mg/dL (ref 0–99)
NonHDL: 106.45
Total CHOL/HDL Ratio: 4
Triglycerides: 101 mg/dL (ref 0.0–149.0)
VLDL: 20.2 mg/dL (ref 0.0–40.0)

## 2023-03-02 LAB — COMPREHENSIVE METABOLIC PANEL
ALT: 40 U/L — ABNORMAL HIGH (ref 0–35)
AST: 36 U/L (ref 0–37)
Albumin: 3.8 g/dL (ref 3.5–5.2)
Alkaline Phosphatase: 64 U/L (ref 39–117)
BUN: 15 mg/dL (ref 6–23)
CO2: 30 meq/L (ref 19–32)
Calcium: 9.2 mg/dL (ref 8.4–10.5)
Chloride: 98 meq/L (ref 96–112)
Creatinine, Ser: 0.82 mg/dL (ref 0.40–1.20)
GFR: 92.35 mL/min (ref >60.00)
Glucose, Bld: 85 mg/dL (ref 70–99)
Potassium: 4.3 meq/L (ref 3.5–5.1)
Sodium: 138 meq/L (ref 135–145)
Total Bilirubin: 0.5 mg/dL (ref 0.2–1.2)
Total Protein: 6.7 g/dL (ref 6.0–8.3)

## 2023-03-02 LAB — TSH: TSH: 2.8 u[IU]/mL (ref 0.35–5.50)

## 2023-03-02 LAB — HEMOGLOBIN A1C: Hgb A1c MFr Bld: 6 % (ref 4.6–6.5)

## 2023-03-03 ENCOUNTER — Telehealth: Payer: Self-pay | Admitting: Family Medicine

## 2023-03-03 MED ORDER — VITAMIN D (ERGOCALCIFEROL) 1.25 MG (50000 UNIT) PO CAPS: 50000.0000 [IU] | ORAL_CAPSULE | ORAL | 1 refills | Status: AC

## 2023-03-03 NOTE — Telephone Encounter (Signed)
Please call patient Liver, kidney and thyroid functions are all stable. Vitamin D levels are low at 22, I have called in a high-dose once weekly vitamin D supplement for her to take.  I called in 6 months worth of this medication to allow her time to get established with a new PCP when she moves to [place].  She should make sure they retest her vitamin D at that time.  She is anemic, it appears it is in the pattern of iron deficiency anemia.  This unfortunately does need more evaluation to identify the cause and correct the anemia.  I would recommend she follow-up as soon as possible so that we can perform an iron panel and attempt to identify cause. Diabetes screening/A1c is still in the prediabetic range at 6.0 Cholesterol panel looks great and is at goal

## 2023-03-03 NOTE — Telephone Encounter (Signed)
LM for pt to return call to discuss.

## 2023-04-18 ENCOUNTER — Ambulatory Visit: Payer: BC Managed Care – PPO | Admitting: Urgent Care

## 2023-04-18 ENCOUNTER — Encounter: Payer: Self-pay | Admitting: Urgent Care

## 2023-04-18 VITALS — BP 120/80 | HR 81 | Temp 97.9°F | Wt 378.0 lb

## 2023-04-18 DIAGNOSIS — L089 Local infection of the skin and subcutaneous tissue, unspecified: Secondary | ICD-10-CM

## 2023-04-18 DIAGNOSIS — L720 Epidermal cyst: Secondary | ICD-10-CM

## 2023-04-18 DIAGNOSIS — D649 Anemia, unspecified: Secondary | ICD-10-CM

## 2023-04-18 LAB — CBC WITH DIFFERENTIAL/PLATELET
Basophils Absolute: 0.1 10*3/uL (ref 0.0–0.1)
Basophils Relative: 0.6 % (ref 0.0–3.0)
Eosinophils Absolute: 0.2 10*3/uL (ref 0.0–0.7)
Eosinophils Relative: 2.4 % (ref 0.0–5.0)
HCT: 37.8 % (ref 36.0–46.0)
Hemoglobin: 11.4 g/dL — ABNORMAL LOW (ref 12.0–15.0)
Lymphocytes Relative: 30 % (ref 12.0–46.0)
Lymphs Abs: 2.8 10*3/uL (ref 0.7–4.0)
MCHC: 30.2 g/dL (ref 30.0–36.0)
MCV: 73.2 fL — ABNORMAL LOW (ref 78.0–100.0)
Monocytes Absolute: 0.6 10*3/uL (ref 0.1–1.0)
Monocytes Relative: 6 % (ref 3.0–12.0)
Neutro Abs: 5.7 10*3/uL (ref 1.4–7.7)
Neutrophils Relative %: 61 % (ref 43.0–77.0)
Platelets: 369 10*3/uL (ref 150.0–400.0)
RBC: 5.17 Mil/uL — ABNORMAL HIGH (ref 3.87–5.11)
RDW: 18.8 % — ABNORMAL HIGH (ref 11.5–15.5)
WBC: 9.3 10*3/uL (ref 4.0–10.5)

## 2023-04-18 MED ORDER — CEPHALEXIN 500 MG PO CAPS
500.0000 mg | ORAL_CAPSULE | Freq: Four times a day (QID) | ORAL | 0 refills | Status: AC
Start: 2023-04-18 — End: 2023-04-25

## 2023-04-18 NOTE — Progress Notes (Signed)
Established Patient Office Visit  Subjective:  Patient ID: Patricia Berry, female    DOB: [birth year]  Age: 37 y.o. MRN: [id number]  Chief Complaint  Patient presents with  . Cyst    She has noticed a knot on her chest Sunday that is somewhat painful.    [name], with a history of pre-diabetes, presented with a new, tender, hard lump on the chest, noticed a week prior. Initially, she experienced a vague sensation of chest tightness, which progressed to localized soreness over the course of a few days. The lump is superficial, mobile, and has not been associated with any drainage. The patient has a history of a similar cyst on the foot, which was managed conservatively due to personal circumstances.                                                                                                                           In addition, the patient has been experiencing a slow, steady decline in hemoglobin levels over the past two years, with the most recent measurement falling into the anemic range. She reports having heavy menstrual periods, which have increased in duration and volume since childbirth. The patient denies any symptoms of anemia such as fatigue, dizziness, or palpitations.  Assessment & Plan Breast Mass New, tender, hard, mobile mass in the breast noted over the past week. No associated fever, chills, or lymphadenopathy. No drainage. History of a previous cyst in the foot. Differential includes cyst vs abscess. -Consult with Dr. Marvel Plan and consider ultrasound to further characterize the mass. -Discuss potential need for surgical excision depending on ultrasound findings.  Anemia Slow, steady decline in hemoglobin over the past two years, now in the anemic range (11.5). Patient reports heavy menstrual periods, which could be contributing to iron deficiency anemia. -Draw blood today for current hemoglobin level and iron panel. -If moving out of state, recommend  follow-up with a physician in [place] to monitor hemoglobin levels.   Patient Active Problem List   Diagnosis Date Noted  . Prediabetes 03/01/2023  . Insulin resistance 05/03/2022  . Benign diastolic hypertension 01/18/2022  . Anxiety with flying 01/18/2022  . Anxiousness 01/18/2022  . Routine general medical examination at a health care facility 04/13/2021  . Morbid obesity (HCC) 08/04/2017  . FH: heart disease 08/01/2016  . Vitamin D deficiency 10/13/2014  . BMI 60.0-69.9, adult (HCC) 04/25/2014   Past Medical History:  Diagnosis Date  . Anxiety   . Cholestasis   . Cholestasis during pregnancy   . Depression   . Heart disease   . Hepatic steatosis   . History of cholestasis during pregnancy 12/12/2017   Delivered April 2019, fetal demise  . History of cold sores   . History of fetal demise, not currently pregnant 12/12/2017  . History of peripheral edema 04/25/2014  . History of pre-eclampsia   . Hypertension   . NASH (nonalcoholic steatohepatitis)   . Obesity   . S/P  cesarean section 01/10/2019  . Status post vacuum-assisted vaginal delivery 10-05-2017   fetal death  . Vitamin D deficiency    Social History   Tobacco Use  . Smoking status: Never  . Smokeless tobacco: Never  Vaping Use  . Vaping status: Never Used  Substance Use Topics  . Alcohol use: Not Currently    Alcohol/week: 2.0 standard drinks of alcohol    Types: 2 Glasses of wine per week  . Drug use: No      ROS: as noted in HPI  Objective:     BP 120/80   Pulse 81   Temp 97.9 F (36.6 C)   Wt (!) 378 lb (171.5 kg)   SpO2 97%   BMI 64.88 kg/m  BP Readings from Last 3 Encounters:  04/18/23 120/80  03/01/23 122/82  October 06, 2022 109/70   Wt Readings from Last 3 Encounters:  04/18/23 (!) 378 lb (171.5 kg)  03/01/23 (!) 374 lb 9.6 oz (169.9 kg)  10/06/2022 (!) 371 lb 3.2 oz (168.4 kg)      Physical Exam   No results found for any visits on 04/18/23.  Last CBC Lab Results   Component Value Date   WBC 8.4 03/01/2023   HGB 10.9 (L) 03/01/2023   HCT 35.7 (L) 03/01/2023   MCV 72.6 (L) 03/01/2023   MCH 26.7 02/21/2021   RDW 18.0 (H) 03/01/2023   PLT 391.0 03/01/2023      The ASCVD Risk score (Arnett DK, et al., 2019) failed to calculate for the following reasons:   The 2019 ASCVD risk score is only valid for ages 28 to 33  Assessment & Plan:  Infected epidermoid cyst -     Cephalexin; Take 1 capsule (500 mg total) by mouth 4 (four) times daily for 7 days.  Dispense: 28 capsule; Refill: 0  Anemia, unspecified type -     Iron, TIBC and Ferritin Panel -     CBC with Differential/Platelet     No follow-ups on file.   Maretta Bees, PA

## 2023-04-18 NOTE — Patient Instructions (Signed)
You have an infected epidermoid cyst. Please apply a warm compress to the area three times daily. Please take the antibiotic four times daily for the next 7 days. You may consider surgical excision in the future should you desire, however it not recommended while actively infected.  We also drew a CBC and iron panel on you today to retest your hemoglobin.  We will call with the results and any recommendations.

## 2023-04-19 DIAGNOSIS — L089 Local infection of the skin and subcutaneous tissue, unspecified: Secondary | ICD-10-CM | POA: Insufficient documentation

## 2023-04-19 DIAGNOSIS — D649 Anemia, unspecified: Secondary | ICD-10-CM | POA: Insufficient documentation

## 2023-04-19 LAB — IRON,TIBC AND FERRITIN PANEL
%SAT: 8 % — ABNORMAL LOW (ref 16–45)
Ferritin: 6 ng/mL — ABNORMAL LOW (ref 16–154)
Iron: 32 ug/dL — ABNORMAL LOW (ref 40–190)
TIBC: 378 ug/dL (ref 250–450)

## 2023-04-19 NOTE — Assessment & Plan Note (Signed)
Slow, steady decline in hemoglobin over the past two years, now in the anemic range (11.7). Patient reports heavy menstrual periods, which could be contributing to iron deficiency anemia. -Draw blood today for current hemoglobin level and iron panel. -If moving out of state, recommend follow-up with a physician in [place] to monitor hemoglobin levels, possible gyne workup if indicated.

## 2023-04-19 NOTE — Assessment & Plan Note (Signed)
New, tender, mobile mass in the breast noted over the past week. No associated fever, chills, or lymphadenopathy. No drainage. History of a previous cyst in the foot. Soft tissue ultrasound performed in office suggestive of cyst.  -Start antibiotics to cover for superficial infection.  -Warm compresses 2-3 x/ day to the affected area of the chest wall -Discuss potential need for surgical excision depending on response to treatment.
